# Patient Record
Sex: Female | Born: 1935 | Race: White | Hispanic: No | Smoking: Former smoker
Health system: Southern US, Community
[De-identification: ages and names within clinical notes are randomized; demographics above are authoritative.]

## PROBLEM LIST (undated history)

## (undated) DIAGNOSIS — F329 Major depressive disorder, single episode, unspecified: Secondary | ICD-10-CM

## (undated) DIAGNOSIS — F419 Anxiety disorder, unspecified: Secondary | ICD-10-CM

## (undated) DIAGNOSIS — J449 Chronic obstructive pulmonary disease, unspecified: Secondary | ICD-10-CM

## (undated) DIAGNOSIS — G4733 Obstructive sleep apnea (adult) (pediatric): Secondary | ICD-10-CM

## (undated) DIAGNOSIS — E079 Disorder of thyroid, unspecified: Secondary | ICD-10-CM

## (undated) DIAGNOSIS — I890 Lymphedema, not elsewhere classified: Secondary | ICD-10-CM

## (undated) DIAGNOSIS — C8590 Non-Hodgkin lymphoma, unspecified, unspecified site: Secondary | ICD-10-CM

## (undated) DIAGNOSIS — E785 Hyperlipidemia, unspecified: Secondary | ICD-10-CM

## (undated) DIAGNOSIS — I509 Heart failure, unspecified: Secondary | ICD-10-CM

## (undated) DIAGNOSIS — I10 Essential (primary) hypertension: Secondary | ICD-10-CM

## (undated) DIAGNOSIS — I499 Cardiac arrhythmia, unspecified: Secondary | ICD-10-CM

## (undated) DIAGNOSIS — D649 Anemia, unspecified: Secondary | ICD-10-CM

## (undated) DIAGNOSIS — F32A Depression, unspecified: Secondary | ICD-10-CM

## (undated) DIAGNOSIS — M1990 Unspecified osteoarthritis, unspecified site: Secondary | ICD-10-CM

## (undated) DIAGNOSIS — E119 Type 2 diabetes mellitus without complications: Secondary | ICD-10-CM

## (undated) DIAGNOSIS — N189 Chronic kidney disease, unspecified: Secondary | ICD-10-CM

## (undated) HISTORY — DX: Non-Hodgkin lymphoma, unspecified, unspecified site: C85.90

## (undated) HISTORY — PX: ABDOMINAL HYSTERECTOMY: SHX81

## (undated) HISTORY — DX: Type 2 diabetes mellitus without complications: E11.9

## (undated) HISTORY — PX: APPENDECTOMY: SHX54

## (undated) HISTORY — DX: Unspecified osteoarthritis, unspecified site: M19.90

## (undated) HISTORY — DX: Depression, unspecified: F32.A

## (undated) HISTORY — DX: Chronic kidney disease, unspecified: N18.9

## (undated) HISTORY — DX: Disorder of thyroid, unspecified: E07.9

## (undated) HISTORY — DX: Major depressive disorder, single episode, unspecified: F32.9

## (undated) HISTORY — DX: Anxiety disorder, unspecified: F41.9

## (undated) HISTORY — PX: KNEE SURGERY: SHX244

## (undated) HISTORY — DX: Cardiac arrhythmia, unspecified: I49.9

## (undated) HISTORY — PX: TONSILLECTOMY AND ADENOIDECTOMY: SHX28

## (undated) HISTORY — PX: CORONARY ANGIOPLASTY: SHX604

## (undated) HISTORY — DX: Essential (primary) hypertension: I10

## (undated) HISTORY — PX: OTHER SURGICAL HISTORY: SHX169

## (undated) HISTORY — DX: Obstructive sleep apnea (adult) (pediatric): G47.33

## (undated) HISTORY — DX: Anemia, unspecified: D64.9

## (undated) HISTORY — DX: Heart failure, unspecified: I50.9

## (undated) HISTORY — DX: Hyperlipidemia, unspecified: E78.5

## (undated) HISTORY — DX: Chronic obstructive pulmonary disease, unspecified: J44.9

## (undated) HISTORY — PX: CARDIAC CATHETERIZATION: SHX172

---

## 2009-06-24 DIAGNOSIS — G4733 Obstructive sleep apnea (adult) (pediatric): Secondary | ICD-10-CM

## 2009-06-24 HISTORY — DX: Obstructive sleep apnea (adult) (pediatric): G47.33

## 2013-11-14 ENCOUNTER — Emergency Department: Payer: Self-pay | Admitting: Emergency Medicine

## 2013-11-14 LAB — TROPONIN I: Troponin-I: <0.02 ng/mL

## 2013-11-14 LAB — CBC
HCT: 25.8 % — ABNORMAL LOW (ref 35.0–47.0)
HGB: 8.4 g/dL — AB (ref 12.0–16.0)
MCH: 31.9 pg (ref 26.0–34.0)
MCHC: 32.7 g/dL (ref 32.0–36.0)
MCV: 98 fL (ref 80–100)
Platelet: 168 10*3/uL (ref 150–440)
RBC: 2.64 10*6/uL — ABNORMAL LOW (ref 3.80–5.20)
RDW: 14.8 % — ABNORMAL HIGH (ref 11.5–14.5)
WBC: 11.3 10*3/uL — ABNORMAL HIGH (ref 3.6–11.0)

## 2013-11-14 LAB — BASIC METABOLIC PANEL
ANION GAP: 9 (ref 7–16)
BUN: 43 mg/dL — AB (ref 7–18)
Calcium, Total: 8.4 mg/dL — ABNORMAL LOW (ref 8.5–10.1)
Chloride: 104 mmol/L (ref 98–107)
Co2: 24 mmol/L (ref 21–32)
Creatinine: 2.23 mg/dL — ABNORMAL HIGH (ref 0.60–1.30)
EGFR (African American): 23 — ABNORMAL LOW
EGFR (Non-African Amer.): 20 — ABNORMAL LOW
Glucose: 216 mg/dL — ABNORMAL HIGH (ref 65–99)
OSMOLALITY: 291 (ref 275–301)
POTASSIUM: 4.3 mmol/L (ref 3.5–5.1)
Sodium: 137 mmol/L (ref 136–145)

## 2013-11-14 LAB — PRO B NATRIURETIC PEPTIDE: B-Type Natriuretic Peptide: 1352 pg/mL — ABNORMAL HIGH (ref 0–450)

## 2014-01-23 LAB — CBC WITH DIFFERENTIAL/PLATELET
BASOS ABS: 0.1 10*3/uL (ref 0.0–0.1)
BASOS PCT: 0.6 %
EOS ABS: 0.2 10*3/uL (ref 0.0–0.7)
Eosinophil %: 1 %
HCT: 34.3 % — AB (ref 35.0–47.0)
HGB: 10.7 g/dL — ABNORMAL LOW (ref 12.0–16.0)
Lymphocyte #: 2 10*3/uL (ref 1.0–3.6)
Lymphocyte %: 12.4 %
MCH: 30.8 pg (ref 26.0–34.0)
MCHC: 31.1 g/dL — ABNORMAL LOW (ref 32.0–36.0)
MCV: 99 fL (ref 80–100)
Monocyte #: 0.8 x10 3/mm (ref 0.2–0.9)
Monocyte %: 4.7 %
NEUTROS ABS: 12.9 10*3/uL — AB (ref 1.4–6.5)
NEUTROS PCT: 81.3 %
PLATELETS: 191 10*3/uL (ref 150–440)
RBC: 3.47 10*6/uL — ABNORMAL LOW (ref 3.80–5.20)
RDW: 15.2 % — ABNORMAL HIGH (ref 11.5–14.5)
WBC: 15.9 10*3/uL — AB (ref 3.6–11.0)

## 2014-01-24 ENCOUNTER — Inpatient Hospital Stay: Payer: Self-pay | Admitting: Internal Medicine

## 2014-01-24 LAB — TROPONIN I: Troponin-I: <0.02 ng/mL

## 2014-01-24 LAB — COMPREHENSIVE METABOLIC PANEL
ALK PHOS: 100 U/L
AST: 21 U/L (ref 15–37)
Albumin: 3 g/dL — ABNORMAL LOW (ref 3.4–5.0)
Anion Gap: 1 — ABNORMAL LOW (ref 7–16)
BILIRUBIN TOTAL: 0.4 mg/dL (ref 0.2–1.0)
BUN: 28 mg/dL — AB (ref 7–18)
CALCIUM: 8.7 mg/dL (ref 8.5–10.1)
CHLORIDE: 111 mmol/L — AB (ref 98–107)
Co2: 27 mmol/L (ref 21–32)
Creatinine: 1.55 mg/dL — ABNORMAL HIGH (ref 0.60–1.30)
GFR CALC AF AMER: 36 — AB
GFR CALC NON AF AMER: 31 — AB
GLUCOSE: 61 mg/dL — AB (ref 65–99)
OSMOLALITY: 281 (ref 275–301)
Potassium: 4.7 mmol/L (ref 3.5–5.1)
SGPT (ALT): 21 U/L
Sodium: 139 mmol/L (ref 136–145)
Total Protein: 7.2 g/dL (ref 6.4–8.2)

## 2014-01-24 LAB — HEMOGLOBIN A1C: Hemoglobin A1C: 8.4 % — ABNORMAL HIGH (ref 4.2–6.3)

## 2014-01-24 LAB — LIPID PANEL
Cholesterol: 117 mg/dL (ref 0–200)
HDL Cholesterol: 59 mg/dL (ref 40–60)
Ldl Cholesterol, Calc: 49 mg/dL (ref 0–100)
Triglycerides: 44 mg/dL (ref 0–200)
VLDL Cholesterol, Calc: 9 mg/dL (ref 5–40)

## 2014-01-24 LAB — URINALYSIS, COMPLETE
Bacteria: NONE SEEN
Bilirubin,UR: NEGATIVE
Glucose,UR: 50 mg/dL (ref 0–75)
Hyaline Cast: 2
KETONE: NEGATIVE
Leukocyte Esterase: NEGATIVE
Nitrite: NEGATIVE
Ph: 5 (ref 4.5–8.0)
Protein: 30
RBC,UR: 2 /HPF (ref 0–5)
Specific Gravity: 1.006 (ref 1.003–1.030)
Squamous Epithelial: 1

## 2014-01-24 LAB — TSH: Thyroid Stimulating Horm: 1.68 u[IU]/mL

## 2014-01-24 LAB — PRO B NATRIURETIC PEPTIDE: B-Type Natriuretic Peptide: 1054 pg/mL — ABNORMAL HIGH (ref 0–450)

## 2014-01-25 LAB — COMPREHENSIVE METABOLIC PANEL
Albumin: 2.6 g/dL — ABNORMAL LOW (ref 3.4–5.0)
Alkaline Phosphatase: 81 U/L
Anion Gap: 8 (ref 7–16)
BUN: 27 mg/dL — ABNORMAL HIGH (ref 7–18)
Bilirubin,Total: 1 mg/dL (ref 0.2–1.0)
Calcium, Total: 8.9 mg/dL (ref 8.5–10.1)
Chloride: 102 mmol/L (ref 98–107)
Co2: 28 mmol/L (ref 21–32)
Creatinine: 1.54 mg/dL — ABNORMAL HIGH (ref 0.60–1.30)
EGFR (African American): 36 — ABNORMAL LOW
EGFR (Non-African Amer.): 31 — ABNORMAL LOW
Glucose: 227 mg/dL — ABNORMAL HIGH (ref 65–99)
Osmolality: 288 (ref 275–301)
Potassium: 4.9 mmol/L (ref 3.5–5.1)
SGOT(AST): 11 U/L — ABNORMAL LOW (ref 15–37)
SGPT (ALT): 19 U/L
Sodium: 138 mmol/L (ref 136–145)
Total Protein: 5.9 g/dL — ABNORMAL LOW (ref 6.4–8.2)

## 2014-01-25 LAB — CBC WITH DIFFERENTIAL/PLATELET
Basophil #: 0.1 10*3/uL (ref 0.0–0.1)
Basophil %: 0.5 %
Eosinophil #: 0 10*3/uL (ref 0.0–0.7)
Eosinophil %: 0.2 %
HCT: 29.5 % — ABNORMAL LOW (ref 35.0–47.0)
HGB: 9.3 g/dL — ABNORMAL LOW (ref 12.0–16.0)
Lymphocyte #: 1.7 10*3/uL (ref 1.0–3.6)
Lymphocyte %: 13.4 %
MCH: 31.1 pg (ref 26.0–34.0)
MCHC: 31.5 g/dL — ABNORMAL LOW (ref 32.0–36.0)
MCV: 99 fL (ref 80–100)
Monocyte #: 0.7 x10 3/mm (ref 0.2–0.9)
Monocyte %: 5.5 %
Neutrophil #: 10 10*3/uL — ABNORMAL HIGH (ref 1.4–6.5)
Neutrophil %: 80.4 %
Platelet: 152 10*3/uL (ref 150–440)
RBC: 2.99 10*6/uL — ABNORMAL LOW (ref 3.80–5.20)
RDW: 15.3 % — ABNORMAL HIGH (ref 11.5–14.5)
WBC: 12.5 10*3/uL — ABNORMAL HIGH (ref 3.6–11.0)

## 2014-01-26 LAB — BASIC METABOLIC PANEL
Anion Gap: 6 — ABNORMAL LOW (ref 7–16)
BUN: 35 mg/dL — ABNORMAL HIGH (ref 7–18)
Calcium, Total: 8.3 mg/dL — ABNORMAL LOW (ref 8.5–10.1)
Chloride: 101 mmol/L (ref 98–107)
Co2: 29 mmol/L (ref 21–32)
Creatinine: 2.11 mg/dL — ABNORMAL HIGH (ref 0.60–1.30)
EGFR (African American): 25 — ABNORMAL LOW
EGFR (Non-African Amer.): 21 — ABNORMAL LOW
Glucose: 255 mg/dL — ABNORMAL HIGH (ref 65–99)
Osmolality: 289 (ref 275–301)
Potassium: 4.3 mmol/L (ref 3.5–5.1)
Sodium: 136 mmol/L (ref 136–145)

## 2014-01-26 LAB — CBC WITH DIFFERENTIAL/PLATELET
Basophil #: 0 10*3/uL (ref 0.0–0.1)
Basophil %: 0.3 %
Eosinophil #: 0.2 10*3/uL (ref 0.0–0.7)
Eosinophil %: 2.3 %
HCT: 26 % — ABNORMAL LOW (ref 35.0–47.0)
HGB: 8.8 g/dL — ABNORMAL LOW (ref 12.0–16.0)
Lymphocyte #: 1.2 10*3/uL (ref 1.0–3.6)
Lymphocyte %: 11.9 %
MCH: 32.3 pg (ref 26.0–34.0)
MCHC: 33.7 g/dL (ref 32.0–36.0)
MCV: 96 fL (ref 80–100)
Monocyte #: 0.5 x10 3/mm (ref 0.2–0.9)
Monocyte %: 5 %
Neutrophil #: 7.8 10*3/uL — ABNORMAL HIGH (ref 1.4–6.5)
Neutrophil %: 80.5 %
Platelet: 152 10*3/uL (ref 150–440)
RBC: 2.71 10*6/uL — ABNORMAL LOW (ref 3.80–5.20)
RDW: 14.8 % — ABNORMAL HIGH (ref 11.5–14.5)
WBC: 9.7 10*3/uL (ref 3.6–11.0)

## 2014-01-27 LAB — CBC WITH DIFFERENTIAL/PLATELET
Basophil #: 0 10*3/uL (ref 0.0–0.1)
Basophil %: 0.5 %
Eosinophil #: 0.4 10*3/uL (ref 0.0–0.7)
Eosinophil %: 6.5 %
HCT: 26.2 % — ABNORMAL LOW (ref 35.0–47.0)
HGB: 8.6 g/dL — ABNORMAL LOW (ref 12.0–16.0)
Lymphocyte #: 1.3 10*3/uL (ref 1.0–3.6)
Lymphocyte %: 20.3 %
MCH: 31.8 pg (ref 26.0–34.0)
MCHC: 32.8 g/dL (ref 32.0–36.0)
MCV: 97 fL (ref 80–100)
Monocyte #: 0.4 x10 3/mm (ref 0.2–0.9)
Monocyte %: 6 %
Neutrophil #: 4.4 10*3/uL (ref 1.4–6.5)
Neutrophil %: 66.7 %
Platelet: 167 10*3/uL (ref 150–440)
RBC: 2.7 10*6/uL — ABNORMAL LOW (ref 3.80–5.20)
RDW: 14.9 % — ABNORMAL HIGH (ref 11.5–14.5)
WBC: 6.6 10*3/uL (ref 3.6–11.0)

## 2014-01-27 LAB — BASIC METABOLIC PANEL
Anion Gap: 6 — ABNORMAL LOW (ref 7–16)
BUN: 56 mg/dL — ABNORMAL HIGH (ref 7–18)
Calcium, Total: 8.3 mg/dL — ABNORMAL LOW (ref 8.5–10.1)
Chloride: 100 mmol/L (ref 98–107)
Co2: 29 mmol/L (ref 21–32)
Glucose: 150 mg/dL — ABNORMAL HIGH (ref 65–99)
Osmolality: 288 (ref 275–301)
Potassium: 4.5 mmol/L (ref 3.5–5.1)
Sodium: 135 mmol/L — ABNORMAL LOW (ref 136–145)

## 2014-01-27 LAB — CREATININE, SERUM
Creatinine: 2.8 mg/dL — ABNORMAL HIGH (ref 0.60–1.30)
EGFR (African American): 18 — ABNORMAL LOW
EGFR (Non-African Amer.): 15 — ABNORMAL LOW

## 2014-01-28 LAB — BASIC METABOLIC PANEL
ANION GAP: 7 (ref 7–16)
BUN: 59 mg/dL — AB (ref 7–18)
CALCIUM: 8.4 mg/dL — AB (ref 8.5–10.1)
CREATININE: 2.42 mg/dL — AB (ref 0.60–1.30)
Chloride: 101 mmol/L (ref 98–107)
Co2: 29 mmol/L (ref 21–32)
EGFR (Non-African Amer.): 18 — ABNORMAL LOW
GFR CALC AF AMER: 21 — AB
Glucose: 149 mg/dL — ABNORMAL HIGH (ref 65–99)
Osmolality: 293 (ref 275–301)
Potassium: 4.3 mmol/L (ref 3.5–5.1)
Sodium: 137 mmol/L (ref 136–145)

## 2014-01-28 LAB — CULTURE, BLOOD (SINGLE)

## 2014-01-30 LAB — BASIC METABOLIC PANEL
Anion Gap: 7 (ref 7–16)
BUN: 41 mg/dL — AB (ref 7–18)
CO2: 28 mmol/L (ref 21–32)
CREATININE: 1.71 mg/dL — AB (ref 0.60–1.30)
Calcium, Total: 8.6 mg/dL (ref 8.5–10.1)
Chloride: 104 mmol/L (ref 98–107)
EGFR (Non-African Amer.): 28 — ABNORMAL LOW
GFR CALC AF AMER: 32 — AB
GLUCOSE: 365 mg/dL — AB (ref 65–99)
Osmolality: 302 (ref 275–301)
POTASSIUM: 4.8 mmol/L (ref 3.5–5.1)
SODIUM: 139 mmol/L (ref 136–145)

## 2014-01-31 LAB — PLATELET COUNT: Platelet: 241 10*3/uL (ref 150–440)

## 2014-02-02 LAB — EXPECTORATED SPUTUM ASSESSMENT W GRAM STAIN, RFLX TO RESP C

## 2014-02-11 ENCOUNTER — Ambulatory Visit: Payer: Self-pay | Admitting: Family

## 2014-03-21 ENCOUNTER — Ambulatory Visit: Payer: Self-pay | Admitting: Family

## 2014-04-26 ENCOUNTER — Emergency Department: Payer: Self-pay | Admitting: Emergency Medicine

## 2014-04-26 ENCOUNTER — Ambulatory Visit: Payer: Self-pay | Admitting: Emergency Medicine

## 2014-04-26 LAB — BASIC METABOLIC PANEL
ANION GAP: 7 (ref 7–16)
BUN: 39 mg/dL — AB (ref 7–18)
CHLORIDE: 106 mmol/L (ref 98–107)
CO2: 27 mmol/L (ref 21–32)
CREATININE: 1.76 mg/dL — AB (ref 0.60–1.30)
Calcium, Total: 9.3 mg/dL (ref 8.5–10.1)
EGFR (Non-African Amer.): 29 — ABNORMAL LOW
GFR CALC AF AMER: 36 — AB
Glucose: 240 mg/dL — ABNORMAL HIGH (ref 65–99)
Osmolality: 297 (ref 275–301)
Potassium: 4.3 mmol/L (ref 3.5–5.1)
Sodium: 140 mmol/L (ref 136–145)

## 2014-04-26 LAB — CBC
HCT: 40.7 % (ref 35.0–47.0)
HGB: 13 g/dL (ref 12.0–16.0)
MCH: 31.6 pg (ref 26.0–34.0)
MCHC: 32.1 g/dL (ref 32.0–36.0)
MCV: 99 fL (ref 80–100)
PLATELETS: 228 10*3/uL (ref 150–440)
RBC: 4.13 10*6/uL (ref 3.80–5.20)
RDW: 14.9 % — ABNORMAL HIGH (ref 11.5–14.5)
WBC: 8.9 10*3/uL (ref 3.6–11.0)

## 2014-04-26 LAB — TROPONIN I: Troponin-I: 0.02 ng/mL

## 2014-10-15 NOTE — Discharge Summary (Signed)
PATIENT NAME:  Jill York, Jill York MR#:  [id number] DATE OF BIRTH:  [birth year]  DATE OF ADMISSION:  01/24/2014 DATE OF DISCHARGE:    ANTICIPATED DISCHARGE DATE: 01/31/2014.  DISPOSITION: Skilled nursing facility.   DISCHARGE DIAGNOSES:  1.  Acute respiratory failure.  2.  Acute chronic obstructive pulmonary disease exacerbation. 3.  Bilateral pneumonia.  4.  Acute on chronic diastolic congestive heart failure.  5.  Acute renal failure over chronic kidney disease.  6.  Diabetes mellitus type 2.  7.  Anemia of chronic disease.  8.  Hypertension.  9.  Hyperlipidemia.  10.  Morbid obesity.   IMAGING STUDIES:  1.  Include a chest x-ray which showed pulmonary edema and bilateral pneumonia.  2.  Echocardiogram showed EF greater than 50% with no significant wall motion abnormalities.   ADMITTING HISTORY AND PHYSICAL: Please see detailed H and P dictated by Dr. Marcille Blanco. In brief, an 81 year old female patient was admitted to the Hospitalist Service after she presented with acute respiratory failure along with orthopnea, coughing.   HOSPITAL COURSE:  1.  Acute respiratory failure. This was secondary to a combination of bilateral pneumonia, COPD exacerbation and congestive heart failure. The patient initially diuresed well with improvement in breathing, but her creatinine slowly trended up for which Lasix was held. The patient has been on ceftriaxone and azithromycin and is doing well. Also, on a prednisone taper at this point. The patient is down to 2 liters oxygen from BiPAP, which she was on initially.  2.  Acute renal failure with CKD. This is secondary to her pneumonia and diuresis. Presently, the patient is on oral Lasix, creatinine is trending down and is close to normal, baseline is 1.6, today she is at 1.7.  3.  Anemia of chronic disease, stable.  4.  Deconditioning. The patient felt extremely weak, presently is improving, worked with physical therapy and SNF has been recommended.   At this  point, the patient needs further PT evaluation tomorrow. Depending on that, can decide if she needs to go SNF or home with Home Health.   DISCHARGE INSTRUCTIONS: The patient will be on a diabetic, low-salt diet. Activity as tolerated. Oxygen 2 liters continuous. Follow up with primary care physician in 1-2 weeks.   DISCHARGE MEDICATIONS:  1.  Aspirin 81 mg daily.  2.  Coreg 12.5 mg oral 2 times a day.  3.  Effexor 37.5 mg oral 2 times a day.  4.  Humalog 16 units subcutaneous 3 times a day before meals.  5.  Lantus 30 units subcutaneous once a day.  6.  Lipitor 80 mg oral once a day.  7.  Losartan 100 mg daily.  8.  Norvasc 10 mg 2 tablets oral 2 times a day.  9.  Plavix 75 mg daily. 10.  Synthroid 50 mcg daily.  11.  Lasix 40 mg daily.  12.  Levaquin 250 mg oral daily.  13.  Prednisone taper.  14.  DuoNeb 3 mL inhaled q. 4 p.r.n.  15.  Docusate/Senna 1 tablet b.i.d.   TIME SPENT: Today on this case was 45 minutes.   ____________________________ Leia Alf Joelle Flessner, MD srs:tm D: 01/30/2014 12:40:46 ET T: 01/30/2014 13:53:22 ET JOB#: [id number]  cc: Alveta Heimlich R. Zhana Jeangilles, MD, <Dictator> Neita Carp MD ELECTRONICALLY SIGNED 02/07/2014 14:11

## 2014-10-15 NOTE — Discharge Summary (Signed)
PATIENT NAME:  Jill York, Jill York MR#:  [id number] DATE OF BIRTH:  Oct 01, 1930  DATE OF ADMISSION:  01/24/2014 DATE OF DISCHARGE:  01/31/2014  ADDENDUM:   To earlier dictated discharge summary by Dr. Hillary Bow on 01/30/2014.  Patient's physical therapy evaluation was done on 08/10, and patient wanted to go home with home health anyways, so we had to arrange for those services at home, and we also arranged for oxygen supplementation at home, and discharge to home.   For further details, please see discharge summary done by Dr. Hillary Bow on 008/02/2014.    ____________________________ Ceasar Lund. Anselm Jungling, MD vgv:nt D: 02/01/2014 16:11:49 ET T: 02/01/2014 16:58:49 ET JOB#: [id number]  cc: Ceasar Lund. Anselm Jungling, MD, <Dictator> Vaughan Basta MD ELECTRONICALLY SIGNED 02/27/2014 9:13

## 2014-10-15 NOTE — H&P (Signed)
PATIENT NAME:  Jill York, Jill York MR#:  [id number] DATE OF BIRTH:  16-Feb-1931  DATE OF ADMISSION:  01/24/2014  REFERRING PHYSICIAN: Dr. Beather Arbour.  PRIMARY CARE PHYSICIAN: Currently in [place], but used to be Dr. Eloise Levels with [facility].  ADMITTING DIAGNOSES: Acute hypercarbic respiratory failure. Congestive heart failure exacerbation.  HISTORY OF PRESENT ILLNESS: This is an 81 year old Caucasian female who presented to the Emergency Department approximately two hours after the onset of wheezing and nausea. The patient never vomited, but had worsening respiratory distress. Upon arrival, her respiratory rate was 50 breaths per minute, and her oxygen saturation 79% on room air. She was immediately placed on oxygen, and following arterial blood gas results which showed respiratory acidosis, the patient was placed on CPAP of 5. Prior to this episode of acute respiratory distress, the patient had been taking all of her medicines as usual. She has seen her new primary care doctor just a little more than a week ago. The only change in her medication has been the addition of tramadol for knee pain. She is not sure exactly what precipitated today's episode; however, she was sitting outside on her porch for approximately one hour. She does not usually need supplemental oxygen at home. She is ambulatory at baseline and does not require BiPAP to sleep at night.   REVIEW OF SYSTEMS: CONSTITUTIONAL: The patient denies fever or weakness. She admits to some fatigue.  EYES: The patient denies decreased visual acuity or inflammation.  RESPIRATORY: The patient denies cough, but admits to wheezing and shortness of breath. CARDIOVASCULAR: The patient denies chest pain, orthopnea, or palpitations.  GASTROINTESTINAL: The patient admits to some nausea but denies vomiting, diarrhea, or constipation.  GENITOURINARY: The patient denies dysuria or increased frequency or hesitancy. ENDOCRINE: The patient denies nocturia or  polyuria.  HEMATOLOGIC AND LYMPHATIC: The patient denies bleeding. She admits to anemia and easy bruising.  INTEGUMENT: The patient denies rashes or lesions.  MUSCULOSKELETAL: The patient admits to arthralgia of her left knee, but denies any myalgias.  NEUROLOGIC: The patient denies numbness or weakness.  PSYCHIATRIC: The patient denies suicidal ideation or homicidal ideation.   PAST MEDICAL HISTORY: Significant for coronary artery disease, peripheral artery disease, diabetes mellitus type 2, hypertension and hyperlipidemia.   PAST SURGICAL HISTORY: The patient has had a hysterectomy as well as a left total knee replacement.   SOCIAL HISTORY: The patient is a former smoker. She does not do any drugs or drink alcohol.   FAMILY HISTORY: Significant for coronary artery disease and diabetes.   MEDICATIONS: The patient does not have her medication list at this time. She uses a Investment banker, corporate called [employer], which should have an accurate list. She also has Community education officer, which should have an accurate list of her medications.   ALLERGIES: Demerol, lisinopril, and latex tape.   PERTINENT LABORATORY RESULTS AND RADIOLOGIC FINDINGS: Arterial blood gas shows a pH of 7.27, pCO2 of 57, PO2 of 81 and a base excess of -1.7; that was taken on FiO2 of 60. BUN 28, creatinine 1.5, glucose 61. BNP is 1054. Troponin is negative. Lactic acid is normal. White count of 15.9, hematocrit 34.3, percentage of neutrophils 81%. Chest x-ray shows pulmonary edema.   PHYSICAL EXAM:  VITAL SIGNS: Temperature 98.2, heart rate 70, respiratory rate 30, blood pressure 165/86, pulse oximetry 94% on FiO2 of 60. Those vital signs were recorded while the patient was on a CPAP with 5 pressure.  GENERAL: The patient is now alert and oriented x3. She is  in no apparent distress.  HEENT: Normocephalic, atraumatic. EOMI. Pupils are equal, round and reactive to light and accommodation. Oropharynx is dry. CPAP mask in place.  NECK:  Trachea is midline. No adenopathy.  CHEST: Symmetric and atraumatic.  CARDIOVASCULAR: Tachycardic rhythm with normal S1, S2. No rubs, clicks, murmurs. LUNGS: Faint crackles in the bases bilaterally. Mildly increased effort with normal excursion. ABDOMEN: Positive bowel sounds, soft, nontender, nondistended. No hepatosplenomegaly. GENITOURINARY: Foley catheter in place by the ED.  MUSCULOSKELETAL: The patient moves all 4 extremities equally. She has 5/5 strength in upper and lower extremities bilaterally.  SKIN: There are no rashes or lesions. The patient does have a number of areas of ecchymosis secondary to accidental bumps around the house.  EXTREMITIES: No clubbing or cyanosis. The patient has trace pretibial edema of her left leg and 1+ pitting edema of her right leg, which she states is normal for her.  NEUROLOGIC: Cranial nerves II through XII are grossly intact.  PSYCHIATRIC: The patient's mood is normal. Affect is congruent.   ASSESSMENT AND PLAN:  1.  This is an 82 year old female with acute hypercarbic respiratory distress. Repeat arterial blood gas shows a significant improvement in pO2. We will wean oxygen at this time. She patient will remain on CPAP until her pH and pCO2 improve. At this time, she is has incomplete compensated respiratory failure. We will admit her to the Intensive Care Unit for respiratory care. She is hemodynamically stable.  2.  Systolic congestive heart failure. The patient has pulmonary edema. She has been given some Lasix in the Emergency Department. Due to her renal insufficiency, we will continue to gently diurese the patient. She currently has a Foley catheter in place for accurate intake and output, but we will remove this as soon as we prove that she has adequate urine output.  3.  Chronic kidney disease, stage III. The patient has a significantly better GFR and creatinine than she did on a previous Emergency Department visit three months ago for the same  problems. GFR is currently 31. We will avoid nephrotoxic agents as much as possible and hydrate her at half maintenance with D5 half-normal saline with 20 mEq of potassium.  4.  Hypertension. Continue the patient's home medications.  5.  Diabetes, type 2. Sliding scale while in the hospital.  6.  Hyperlipidemia. Will obtain lipid panel in the morning. Continue home medications.  7.  Peripheral artery disease status post iliac stenting on the right, stable.  8.  Deep vein thrombosis prophylaxis. Sequential compression devices. 9.  Gastrointestinal prophylaxis. Pantoprazole.  TIME SPENT ON ADMISSION ORDERS AND PATIENT CARE AND CRITICAL CARE: Roughly 1 hour.     ____________________________ Norva Riffle. Marcille Blanco, MD msd:cg D: 01/24/2014 02:39:32 ET T: 01/24/2014 03:18:51 ET JOB#: [id number]  cc: Norva Riffle. Marcille Blanco, MD, <Dictator> Norva Riffle Ji Fairburn MD ELECTRONICALLY SIGNED 01/24/2014 23:49

## 2014-10-20 ENCOUNTER — Emergency Department
Admit: 2014-10-20 | Disposition: A | Discharge status: Home / Self Care | Payer: Self-pay | Admitting: Emergency Medicine

## 2014-10-20 LAB — COMPREHENSIVE METABOLIC PANEL
ALBUMIN: 3.1 g/dL — AB
ANION GAP: 5 — AB (ref 7–16)
AST: 23 U/L
Alkaline Phosphatase: 84 U/L
BUN: 33 mg/dL — ABNORMAL HIGH
Bilirubin,Total: 0.8 mg/dL
CALCIUM: 8.8 mg/dL — AB
CO2: 28 mmol/L
Chloride: 106 mmol/L
Creatinine: 1.52 mg/dL — ABNORMAL HIGH
EGFR (African American): 37 — ABNORMAL LOW
GFR CALC NON AF AMER: 32 — AB
Glucose: 273 mg/dL — ABNORMAL HIGH
POTASSIUM: 4.6 mmol/L
SGPT (ALT): 24 U/L
Sodium: 139 mmol/L
Total Protein: 6.4 g/dL — ABNORMAL LOW

## 2014-10-20 LAB — CBC
HCT: 28.1 % — ABNORMAL LOW (ref 35.0–47.0)
HGB: 9 g/dL — ABNORMAL LOW (ref 12.0–16.0)
MCH: 31.3 pg (ref 26.0–34.0)
MCHC: 32 g/dL (ref 32.0–36.0)
MCV: 98 fL (ref 80–100)
Platelet: 159 10*3/uL (ref 150–440)
RBC: 2.88 10*6/uL — ABNORMAL LOW (ref 3.80–5.20)
RDW: 14.7 % — ABNORMAL HIGH (ref 11.5–14.5)
WBC: 10.2 10*3/uL (ref 3.6–11.0)

## 2014-10-20 LAB — PROTIME-INR
INR: 1.3
Prothrombin Time: 16.5 secs — ABNORMAL HIGH

## 2014-10-20 LAB — APTT: Activated PTT: 36.2 secs — ABNORMAL HIGH (ref 23.6–35.9)

## 2014-10-20 LAB — TROPONIN I: Troponin-I: <0.03 ng/mL

## 2014-10-20 LAB — CK TOTAL AND CKMB (NOT AT ARMC)
CK, Total: 56 U/L
CK-MB: 2.3 ng/mL

## 2014-10-20 LAB — PRO B NATRIURETIC PEPTIDE: B-Type Natriuretic Peptide: 163 pg/mL — ABNORMAL HIGH

## 2014-11-01 DIAGNOSIS — I5032 Chronic diastolic (congestive) heart failure: Secondary | ICD-10-CM

## 2015-01-01 ENCOUNTER — Inpatient Hospital Stay: Payer: Medicare Other

## 2015-01-01 ENCOUNTER — Inpatient Hospital Stay
Admit: 2015-01-01 | Discharge: 2015-01-01 | Disposition: A | Discharge status: Home / Self Care | Payer: Medicare Other | Attending: Internal Medicine | Admitting: Internal Medicine

## 2015-01-01 ENCOUNTER — Encounter: Payer: Self-pay | Admitting: Emergency Medicine

## 2015-01-01 ENCOUNTER — Emergency Department: Payer: Medicare Other

## 2015-01-01 ENCOUNTER — Inpatient Hospital Stay
Admission: EM | Admit: 2015-01-01 | Discharge: 2015-01-04 | DRG: 291 | Disposition: A | Discharge status: Skilled Nursing Facility | Payer: Medicare Other | Attending: Internal Medicine | Admitting: Internal Medicine

## 2015-01-01 DIAGNOSIS — I739 Peripheral vascular disease, unspecified: Secondary | ICD-10-CM | POA: Diagnosis present

## 2015-01-01 DIAGNOSIS — Y92009 Unspecified place in unspecified non-institutional (private) residence as the place of occurrence of the external cause: Secondary | ICD-10-CM

## 2015-01-01 DIAGNOSIS — F329 Major depressive disorder, single episode, unspecified: Secondary | ICD-10-CM | POA: Diagnosis present

## 2015-01-01 DIAGNOSIS — Z7951 Long term (current) use of inhaled steroids: Secondary | ICD-10-CM | POA: Diagnosis not present

## 2015-01-01 DIAGNOSIS — J441 Chronic obstructive pulmonary disease with (acute) exacerbation: Secondary | ICD-10-CM | POA: Diagnosis present

## 2015-01-01 DIAGNOSIS — Z885 Allergy status to narcotic agent status: Secondary | ICD-10-CM | POA: Diagnosis not present

## 2015-01-01 DIAGNOSIS — Z794 Long term (current) use of insulin: Secondary | ICD-10-CM | POA: Diagnosis not present

## 2015-01-01 DIAGNOSIS — I5041 Acute combined systolic (congestive) and diastolic (congestive) heart failure: Principal | ICD-10-CM | POA: Diagnosis present

## 2015-01-01 DIAGNOSIS — Z87891 Personal history of nicotine dependence: Secondary | ICD-10-CM

## 2015-01-01 DIAGNOSIS — E785 Hyperlipidemia, unspecified: Secondary | ICD-10-CM | POA: Diagnosis present

## 2015-01-01 DIAGNOSIS — R0602 Shortness of breath: Secondary | ICD-10-CM

## 2015-01-01 DIAGNOSIS — J9601 Acute respiratory failure with hypoxia: Secondary | ICD-10-CM | POA: Diagnosis present

## 2015-01-01 DIAGNOSIS — R2681 Unsteadiness on feet: Secondary | ICD-10-CM | POA: Diagnosis present

## 2015-01-01 DIAGNOSIS — E039 Hypothyroidism, unspecified: Secondary | ICD-10-CM | POA: Diagnosis present

## 2015-01-01 DIAGNOSIS — I482 Chronic atrial fibrillation: Secondary | ICD-10-CM | POA: Diagnosis present

## 2015-01-01 DIAGNOSIS — Z888 Allergy status to other drugs, medicaments and biological substances status: Secondary | ICD-10-CM | POA: Diagnosis not present

## 2015-01-01 DIAGNOSIS — Z7982 Long term (current) use of aspirin: Secondary | ICD-10-CM

## 2015-01-01 DIAGNOSIS — G4733 Obstructive sleep apnea (adult) (pediatric): Secondary | ICD-10-CM | POA: Diagnosis present

## 2015-01-01 DIAGNOSIS — M1990 Unspecified osteoarthritis, unspecified site: Secondary | ICD-10-CM | POA: Diagnosis present

## 2015-01-01 DIAGNOSIS — W19XXXA Unspecified fall, initial encounter: Secondary | ICD-10-CM

## 2015-01-01 DIAGNOSIS — I129 Hypertensive chronic kidney disease with stage 1 through stage 4 chronic kidney disease, or unspecified chronic kidney disease: Secondary | ICD-10-CM | POA: Diagnosis present

## 2015-01-01 DIAGNOSIS — Z66 Do not resuscitate: Secondary | ICD-10-CM | POA: Diagnosis present

## 2015-01-01 DIAGNOSIS — Z8572 Personal history of non-Hodgkin lymphomas: Secondary | ICD-10-CM

## 2015-01-01 DIAGNOSIS — E1122 Type 2 diabetes mellitus with diabetic chronic kidney disease: Secondary | ICD-10-CM | POA: Diagnosis present

## 2015-01-01 DIAGNOSIS — F418 Other specified anxiety disorders: Secondary | ICD-10-CM | POA: Diagnosis present

## 2015-01-01 DIAGNOSIS — N183 Chronic kidney disease, stage 3 (moderate): Secondary | ICD-10-CM | POA: Diagnosis present

## 2015-01-01 DIAGNOSIS — I2510 Atherosclerotic heart disease of native coronary artery without angina pectoris: Secondary | ICD-10-CM | POA: Diagnosis present

## 2015-01-01 DIAGNOSIS — Z79899 Other long term (current) drug therapy: Secondary | ICD-10-CM

## 2015-01-01 DIAGNOSIS — E1165 Type 2 diabetes mellitus with hyperglycemia: Secondary | ICD-10-CM | POA: Diagnosis present

## 2015-01-01 DIAGNOSIS — I509 Heart failure, unspecified: Secondary | ICD-10-CM

## 2015-01-01 LAB — COMPREHENSIVE METABOLIC PANEL
ALT: 27 U/L (ref 14–54)
AST: 22 U/L (ref 15–41)
Albumin: 3.6 g/dL (ref 3.5–5.0)
Alkaline Phosphatase: 81 U/L (ref 38–126)
Anion gap: 11 (ref 5–15)
BUN: 36 mg/dL — ABNORMAL HIGH (ref 6–20)
CO2: 26 mmol/L (ref 22–32)
Calcium: 8.9 mg/dL (ref 8.9–10.3)
Chloride: 104 mmol/L (ref 101–111)
Creatinine, Ser: 1.76 mg/dL — ABNORMAL HIGH (ref 0.44–1.00)
GFR calc Af Amer: 30 mL/min — ABNORMAL LOW (ref >60)
GFR calc non Af Amer: 26 mL/min — ABNORMAL LOW (ref >60)
Glucose, Bld: 159 mg/dL — ABNORMAL HIGH (ref 65–99)
POTASSIUM: 4.5 mmol/L (ref 3.5–5.1)
SODIUM: 141 mmol/L (ref 135–145)
TOTAL PROTEIN: 7.1 g/dL (ref 6.5–8.1)
Total Bilirubin: 0.8 mg/dL (ref 0.3–1.2)

## 2015-01-01 LAB — GLUCOSE, CAPILLARY
GLUCOSE-CAPILLARY: 239 mg/dL — AB (ref 65–99)
Glucose-Capillary: 157 mg/dL — ABNORMAL HIGH (ref 65–99)
Glucose-Capillary: 293 mg/dL — ABNORMAL HIGH (ref 65–99)

## 2015-01-01 LAB — CBC
HCT: 30.8 % — ABNORMAL LOW (ref 35.0–47.0)
Hemoglobin: 9.7 g/dL — ABNORMAL LOW (ref 12.0–16.0)
MCH: 30.2 pg (ref 26.0–34.0)
MCHC: 31.5 g/dL — ABNORMAL LOW (ref 32.0–36.0)
MCV: 95.9 fL (ref 80.0–100.0)
PLATELETS: 181 10*3/uL (ref 150–440)
RBC: 3.21 MIL/uL — AB (ref 3.80–5.20)
RDW: 16.1 % — ABNORMAL HIGH (ref 11.5–14.5)
WBC: 11.8 10*3/uL — ABNORMAL HIGH (ref 3.6–11.0)

## 2015-01-01 LAB — TROPONIN I

## 2015-01-01 LAB — BRAIN NATRIURETIC PEPTIDE: B Natriuretic Peptide: 215 pg/mL — ABNORMAL HIGH (ref 0.0–100.0)

## 2015-01-01 MED ORDER — MOMETASONE FURO-FORMOTEROL FUM 100-5 MCG/ACT IN AERO
2.0000 | INHALATION_SPRAY | Freq: Two times a day (BID) | RESPIRATORY_TRACT | Status: DC
  Administered 2015-01-01 – 2015-01-04 (×7): 2 via RESPIRATORY_TRACT
  Filled 2015-01-01 (×2): qty 8.8

## 2015-01-01 MED ORDER — INSULIN GLARGINE 100 UNIT/ML ~~LOC~~ SOLN
40.0000 [IU] | Freq: Every day | SUBCUTANEOUS | Status: DC
  Administered 2015-01-01 – 2015-01-03 (×3): 40 [IU] via SUBCUTANEOUS
  Filled 2015-01-01 (×4): qty 0.4

## 2015-01-01 MED ORDER — VENLAFAXINE HCL 37.5 MG PO TABS
75.0000 mg | ORAL_TABLET | Freq: Two times a day (BID) | ORAL | Status: DC
  Administered 2015-01-01 – 2015-01-04 (×7): 75 mg via ORAL
  Filled 2015-01-01 (×8): qty 2

## 2015-01-01 MED ORDER — APIXABAN 2.5 MG PO TABS
2.5000 mg | ORAL_TABLET | Freq: Two times a day (BID) | ORAL | Status: DC
  Administered 2015-01-01 – 2015-01-04 (×7): 2.5 mg via ORAL
  Filled 2015-01-01 (×7): qty 1

## 2015-01-01 MED ORDER — METOPROLOL TARTRATE 25 MG PO TABS
12.5000 mg | ORAL_TABLET | Freq: Two times a day (BID) | ORAL | Status: DC
  Administered 2015-01-01 – 2015-01-04 (×7): 12.5 mg via ORAL
  Filled 2015-01-01 (×7): qty 1

## 2015-01-01 MED ORDER — CALCITRIOL 0.25 MCG PO CAPS
0.2500 ug | ORAL_CAPSULE | ORAL | Status: DC
  Administered 2015-01-02 – 2015-01-04 (×2): 0.25 ug via ORAL
  Filled 2015-01-01 (×2): qty 1

## 2015-01-01 MED ORDER — PANTOPRAZOLE SODIUM 40 MG PO TBEC
40.0000 mg | DELAYED_RELEASE_TABLET | Freq: Every day | ORAL | Status: DC
  Administered 2015-01-02 – 2015-01-03 (×2): 40 mg via ORAL
  Filled 2015-01-01 (×2): qty 1

## 2015-01-01 MED ORDER — VITAMIN D 1000 UNITS PO TABS
1000.0000 [IU] | ORAL_TABLET | Freq: Every day | ORAL | Status: DC
  Administered 2015-01-02 – 2015-01-04 (×3): 1000 [IU] via ORAL
  Filled 2015-01-01 (×3): qty 1

## 2015-01-01 MED ORDER — IPRATROPIUM-ALBUTEROL 0.5-2.5 (3) MG/3ML IN SOLN
3.0000 mL | Freq: Once | RESPIRATORY_TRACT | Status: AC
  Administered 2015-01-01: 3 mL via RESPIRATORY_TRACT

## 2015-01-01 MED ORDER — LEVOTHYROXINE SODIUM 25 MCG PO TABS
137.0000 ug | ORAL_TABLET | Freq: Every day | ORAL | Status: DC
  Administered 2015-01-02 – 2015-01-04 (×3): 137 ug via ORAL
  Filled 2015-01-01 (×3): qty 1

## 2015-01-01 MED ORDER — FUROSEMIDE 10 MG/ML IJ SOLN
20.0000 mg | Freq: Once | INTRAMUSCULAR | Status: AC
  Administered 2015-01-01: 20 mg via INTRAVENOUS

## 2015-01-01 MED ORDER — FUROSEMIDE 10 MG/ML IJ SOLN
40.0000 mg | Freq: Once | INTRAMUSCULAR | Status: AC
  Administered 2015-01-01: 40 mg via INTRAVENOUS
  Filled 2015-01-01: qty 4

## 2015-01-01 MED ORDER — TRAMADOL HCL 50 MG PO TABS: 50.0000 mg | ORAL_TABLET | Freq: Two times a day (BID) | ORAL | Status: DC | PRN

## 2015-01-01 MED ORDER — FUROSEMIDE 10 MG/ML IJ SOLN: 40.0000 mg | Freq: Once | INTRAMUSCULAR | Status: DC

## 2015-01-01 MED ORDER — PREDNISONE 20 MG PO TABS
60.0000 mg | ORAL_TABLET | Freq: Once | ORAL | Status: AC
  Administered 2015-01-01: 60 mg via ORAL

## 2015-01-01 MED ORDER — MONTELUKAST SODIUM 10 MG PO TABS
10.0000 mg | ORAL_TABLET | Freq: Every day | ORAL | Status: DC
Start: 2015-01-01 — End: 2015-01-04
  Administered 2015-01-01 – 2015-01-03 (×3): 10 mg via ORAL
  Filled 2015-01-01 (×3): qty 1

## 2015-01-01 MED ORDER — GABAPENTIN 300 MG PO CAPS
300.0000 mg | ORAL_CAPSULE | Freq: Two times a day (BID) | ORAL | Status: DC
  Administered 2015-01-01 – 2015-01-04 (×7): 300 mg via ORAL
  Filled 2015-01-01 (×7): qty 1

## 2015-01-01 MED ORDER — CALCIUM CARBONATE-VITAMIN D 500-200 MG-UNIT PO TABS
1.0000 | ORAL_TABLET | Freq: Two times a day (BID) | ORAL | Status: DC
  Administered 2015-01-02 – 2015-01-04 (×4): 1 via ORAL
  Filled 2015-01-01 (×11): qty 1

## 2015-01-01 MED ORDER — ACETAMINOPHEN 325 MG PO TABS: 650.0000 mg | ORAL_TABLET | Freq: Four times a day (QID) | ORAL | Status: DC | PRN

## 2015-01-01 MED ORDER — ATORVASTATIN CALCIUM 20 MG PO TABS
80.0000 mg | ORAL_TABLET | Freq: Every day | ORAL | Status: DC
  Administered 2015-01-01: 80 mg via ORAL
  Filled 2015-01-01 (×2): qty 4

## 2015-01-01 MED ORDER — FUROSEMIDE 10 MG/ML IJ SOLN: 40.0000 mg | Freq: Two times a day (BID) | INTRAMUSCULAR | Status: DC

## 2015-01-01 MED ORDER — PREDNISONE 20 MG PO TABS
ORAL_TABLET | ORAL | Status: AC
  Administered 2015-01-01: 60 mg via ORAL
  Filled 2015-01-01: qty 3

## 2015-01-01 MED ORDER — INSULIN ASPART 100 UNIT/ML ~~LOC~~ SOLN
6.0000 [IU] | Freq: Three times a day (TID) | SUBCUTANEOUS | Status: DC
  Administered 2015-01-01 – 2015-01-04 (×9): 6 [IU] via SUBCUTANEOUS
  Filled 2015-01-01 (×10): qty 6

## 2015-01-01 MED ORDER — FLUTICASONE PROPIONATE 50 MCG/ACT NA SUSP
1.0000 | Freq: Every day | NASAL | Status: DC
  Administered 2015-01-01 – 2015-01-04 (×4): 1 via NASAL
  Filled 2015-01-01: qty 16

## 2015-01-01 MED ORDER — DOCUSATE SODIUM 100 MG PO CAPS: 100.0000 mg | ORAL_CAPSULE | Freq: Every day | ORAL | Status: DC | PRN

## 2015-01-01 MED ORDER — ASPIRIN EC 81 MG PO TBEC
81.0000 mg | DELAYED_RELEASE_TABLET | Freq: Every day | ORAL | Status: DC
  Administered 2015-01-01 – 2015-01-04 (×4): 81 mg via ORAL
  Filled 2015-01-01 (×4): qty 1

## 2015-01-01 MED ORDER — ALBUTEROL SULFATE (2.5 MG/3ML) 0.083% IN NEBU
2.5000 mg | INHALATION_SOLUTION | Freq: Four times a day (QID) | RESPIRATORY_TRACT | Status: DC
  Administered 2015-01-01 – 2015-01-04 (×10): 2.5 mg via RESPIRATORY_TRACT
  Filled 2015-01-01 (×12): qty 3

## 2015-01-01 MED ORDER — FUROSEMIDE 10 MG/ML IJ SOLN
INTRAMUSCULAR | Status: AC
  Administered 2015-01-01: 20 mg via INTRAVENOUS
  Filled 2015-01-01: qty 4

## 2015-01-01 MED ORDER — AMLODIPINE BESYLATE 5 MG PO TABS
5.0000 mg | ORAL_TABLET | Freq: Every day | ORAL | Status: DC
  Administered 2015-01-01 – 2015-01-04 (×4): 5 mg via ORAL
  Filled 2015-01-01 (×5): qty 1

## 2015-01-01 MED ORDER — PREDNISONE 20 MG PO TABS
20.0000 mg | ORAL_TABLET | Freq: Every day | ORAL | Status: DC
  Administered 2015-01-02: 20 mg via ORAL
  Filled 2015-01-01: qty 1

## 2015-01-01 MED ORDER — SODIUM CHLORIDE 0.9 % IJ SOLN
3.0000 mL | Freq: Two times a day (BID) | INTRAMUSCULAR | Status: DC
  Administered 2015-01-01 – 2015-01-03 (×5): 3 mL via INTRAVENOUS

## 2015-01-01 MED ORDER — IPRATROPIUM-ALBUTEROL 0.5-2.5 (3) MG/3ML IN SOLN
RESPIRATORY_TRACT | Status: AC
  Administered 2015-01-01: 3 mL via RESPIRATORY_TRACT
  Filled 2015-01-01: qty 3

## 2015-01-01 MED ORDER — LOSARTAN POTASSIUM 50 MG PO TABS
100.0000 mg | ORAL_TABLET | Freq: Every day | ORAL | Status: DC
  Administered 2015-01-01 – 2015-01-04 (×4): 100 mg via ORAL
  Filled 2015-01-01 (×4): qty 2

## 2015-01-01 MED ORDER — INSULIN ASPART 100 UNIT/ML ~~LOC~~ SOLN
0.0000 [IU] | Freq: Every day | SUBCUTANEOUS | Status: DC
  Administered 2015-01-01: 2 [IU] via SUBCUTANEOUS
  Administered 2015-01-02: 3 [IU] via SUBCUTANEOUS
  Filled 2015-01-01: qty 3
  Filled 2015-01-01: qty 2

## 2015-01-01 MED ORDER — BACLOFEN 10 MG PO TABS: 5.0000 mg | ORAL_TABLET | Freq: Every evening | ORAL | Status: DC | PRN

## 2015-01-01 MED ORDER — INSULIN ASPART 100 UNIT/ML ~~LOC~~ SOLN
0.0000 [IU] | Freq: Three times a day (TID) | SUBCUTANEOUS | Status: DC
  Administered 2015-01-01: 11 [IU] via SUBCUTANEOUS
  Administered 2015-01-01 – 2015-01-02 (×3): 4 [IU] via SUBCUTANEOUS
  Administered 2015-01-02: 11 [IU] via SUBCUTANEOUS
  Administered 2015-01-03: 3 [IU] via SUBCUTANEOUS
  Administered 2015-01-03 (×2): 4 [IU] via SUBCUTANEOUS
  Administered 2015-01-04: 7 [IU] via SUBCUTANEOUS
  Filled 2015-01-01: qty 11
  Filled 2015-01-01 (×2): qty 4
  Filled 2015-01-01: qty 7
  Filled 2015-01-01 (×4): qty 4
  Filled 2015-01-01: qty 11
  Filled 2015-01-01: qty 3

## 2015-01-01 MED ORDER — ACETAMINOPHEN 650 MG RE SUPP: 650.0000 mg | Freq: Four times a day (QID) | RECTAL | Status: DC | PRN

## 2015-01-01 NOTE — ED Notes (Signed)
Patient oxygen saturations decreased to 76% on room air. Patient placed on 3L [place] and saturations elevated to 93%. Corky Downs, MD made aware.

## 2015-01-01 NOTE — ED Provider Notes (Signed)
[facility] Emergency Department Provider Note  ____________________________________________  Time seen: Approximately 6:53 AM  I have reviewed the triage vital signs and the nursing notes.   HISTORY  Chief Complaint Shortness of Breath    HPI Jill York is a 81 y.o. female who presents to the ED via EMS for a chief complaint of shortness of breath. Patient states she was grocery shopping in the heat yesterday and feels this exacerbated her COPD. Patient has access to oxygen at home but does not routinely wear it.Patient denies fever, chills, cough, chest pain, abdominal pain, vomiting, diarrhea, headache, numbness, tingling. Patient denies recent travel or surgery. Of note, patient's daughter is currently away from home. Her daughters friend is staying with her and tells me the patient often has anxiety when her daughter is away.   Past Medical History  Diagnosis Date  . CHF (congestive heart failure)   . Thyroid disease   . Diabetes mellitus without complication   . Hypertension   . Hyperlipidemia   . Arrhythmia     PVC  . COPD (chronic obstructive pulmonary disease)   . Chronic kidney disease   . OSA (obstructive sleep apnea) 2011  . Anemia   . Anxiety and depression     [name]  . Lymphoma   . Osteoarthritis     Patient Active Problem List   Diagnosis Date Noted  . Chronic diastolic heart failure 78/93/8101    Past Surgical History  Procedure Laterality Date  . Tonsillectomy and adenoidectomy    . Appendectomy    . Knee surgery Left   . Abdominal hysterectomy    . Cardiac catheterization    . Coronary angioplasty      x4  . Cardiac stents      Current Outpatient Rx  Name  Route  Sig  Dispense  Refill  . amLODipine (NORVASC) 10 MG tablet   Oral   Take 10 mg by mouth daily.         Marland Kitchen aspirin 81 MG tablet   Oral   Take 81 mg by mouth daily.         Marland Kitchen atorvastatin (LIPITOR) 80 MG tablet    Oral   Take 80 mg by mouth daily.         . carvedilol (COREG) 12.5 MG tablet   Oral   Take 12.5 mg by mouth 2 (two) times daily with a meal.         . Cholecalciferol 1000 UNITS tablet   Oral   Take 1,000 Units by mouth daily.         . clopidogrel (PLAVIX) 75 MG tablet   Oral   Take 75 mg by mouth daily.         . Fluticasone-Salmeterol (ADVAIR) 250-50 MCG/DOSE AEPB   Inhalation   Inhale 1 puff into the lungs 2 (two) times daily.         . furosemide (LASIX) 40 MG tablet   Oral   Take 40 mg by mouth daily.         Marland Kitchen gabapentin (NEURONTIN) 100 MG capsule   Oral   Take 100 mg by mouth 3 (three) times daily.         . insulin glargine (LANTUS) 100 UNIT/ML injection   Subcutaneous   Inject 30 Units into the skin daily.         . insulin lispro (HUMALOG) 100 UNIT/ML injection   Subcutaneous  Inject 16 Units into the skin 3 (three) times daily before meals.         Marland Kitchen levothyroxine (SYNTHROID, LEVOTHROID) 50 MCG tablet   Oral   Take 50 mcg by mouth daily before breakfast.         . losartan (COZAAR) 100 MG tablet   Oral   Take 100 mg by mouth daily.         . montelukast (SINGULAIR) 10 MG tablet   Oral   Take 10 mg by mouth at bedtime.         . Polyethylene Glycol 3350 POWD   Oral   Take 17 g by mouth daily as needed (constipation).         Marland Kitchen senna-docusate (SENOKOT-S) 8.6-50 MG per tablet   Oral   Take 1 tablet by mouth 2 (two) times daily.         . traMADol (ULTRAM) 50 MG tablet   Oral   Take 50 mg by mouth every 6 (six) hours as needed.         . venlafaxine (EFFEXOR) 37.5 MG tablet   Oral   Take 37.5 mg by mouth 2 (two) times daily.           Allergies Demerol; Lisinopril; and Tape  History reviewed. No pertinent family history.  Social History History  Substance Use Topics  . Smoking status: Former Smoker -- 1.50 packs/day for 18 years    Quit date: 06/25/1975  . Smokeless tobacco: Never Used  . Alcohol  Use: No    Review of Systems Constitutional: No fever/chills Eyes: No visual changes. ENT: No sore throat. Cardiovascular: Denies chest pain. Respiratory: Positive for shortness of breath. Gastrointestinal: No abdominal pain.  No nausea, no vomiting.  No diarrhea.  No constipation. Genitourinary: Negative for dysuria. Musculoskeletal: Negative for back pain. Skin: Negative for rash. Neurological: Negative for headaches, focal weakness or numbness.  10-point ROS otherwise negative.  ____________________________________________   PHYSICAL EXAM:  VITAL SIGNS: ED Triage Vitals  Enc Vitals Group     BP 01/01/15 0623 132/91 mmHg     Pulse Rate 01/01/15 0623 114     Resp 01/01/15 0623 24     Temp 01/01/15 0623 98.1 F (36.7 C)     Temp Source 01/01/15 0623 Oral     SpO2 01/01/15 0623 94 %     Weight 01/01/15 0623 224 lb (101.606 kg)     Height 01/01/15 0623 5\' 4"  (1.626 m)     Head Cir --      Peak Flow --      Pain Score --      Pain Loc --      Pain Edu? --      Excl. in [place]? --     Constitutional: Alert and oriented. Well appearing and in no acute distress. Eyes: Conjunctivae are normal. PERRL. EOMI. Head: Atraumatic. Nose: No congestion/rhinnorhea. Mouth/Throat: Mucous membranes are moist.  Oropharynx non-erythematous. Neck: No stridor.   Cardiovascular: Irregularly irregular rhythm. Grossly normal heart sounds.  Good peripheral circulation. Respiratory: Normal respiratory effort.  No retractions. Lungs with occasional expiratory wheeze. Gastrointestinal: Soft and nontender. No distention. No abdominal bruits. No CVA tenderness. Musculoskeletal: No lower extremity tenderness nor edema.  No joint effusions. Neurologic:  Normal speech and language. No gross focal neurologic deficits are appreciated. Speech is normal.  Skin:  Skin is warm, dry and intact. No rash noted. Psychiatric: Mood and affect are normal. Speech and behavior are  normal.  ____________________________________________   LABS (all labs ordered are listed, but only abnormal results are displayed)  Labs Reviewed  CBC - Abnormal; Notable for the following:    WBC 11.8 (*)    RBC 3.21 (*)    Hemoglobin 9.7 (*)    HCT 30.8 (*)    MCHC 31.5 (*)    RDW 16.1 (*)    All other components within normal limits  TROPONIN I  COMPREHENSIVE METABOLIC PANEL  BRAIN NATRIURETIC PEPTIDE   ____________________________________________  EKG  ED ECG REPORT I, Jeancarlo Leffler J, the attending physician, personally viewed and interpreted this ECG.   Date: 01/01/2015  EKG Time: 0621  Rate: 113  Rhythm: atrial fibrillation, rate 113  Axis: LAD  Intervals:none  ST&T Change: Nonspecific  ____________________________________________  RADIOLOGY  Portable chest x-ray (viewed by me, interpreted per Dr. Pascal Lux): CHF pattern.   ____________________________________________   PROCEDURES  Procedure(s) performed: None  Critical Care performed: No  ____________________________________________   INITIAL IMPRESSION / ASSESSMENT AND PLAN / ED COURSE  Pertinent labs & imaging results that were available during my care of the patient were reviewed by me and considered in my medical decision making (see chart for details).  81 year old female who presents with shortness of breath with a history of COPD as well as congestive heart failure. Will check basic lab work including BNP, EKG, chest x-ray. Chest x-ray indicates CHF pattern; will administer IV Lasix. PO prednisone and DuoNeb ordered.   ----------------------------------------- 6:59 AM on 01/01/2015 -----------------------------------------  Care transferred to Dr. Corky Downs pending laboratory results and reassessment. ____________________________________________   FINAL CLINICAL IMPRESSION(S) / ED DIAGNOSES  Final diagnoses:  Shortness of breath  COPD with acute exacerbation      Paulette Blanch,  MD 01/01/15 0700

## 2015-01-01 NOTE — Evaluation (Signed)
Physical Therapy Evaluation Patient Details Name: Jill York MRN: [id number] DOB: March 29, 1931 Today's Date: 01/01/2015   History of Present Illness  Pt started having shortness of breath after going to the grocery store on 7/8  Clinical Impression  Pt is slow and labored with some mobility/transfers/ambualtion but ultimately does not show balance or significant safety concerns.  She does however de-saturate to 82% with very minimal ambulation on room air. Pt wants to go home, but being alone this does not seem like the safest option per today's performance.    Follow Up Recommendations SNF (pt does not want to go to rehab )    Equipment Recommendations       Recommendations for Other Services       Precautions / Restrictions Precautions Precautions: Fall Restrictions Weight Bearing Restrictions: No      Mobility  Bed Mobility Overal bed mobility: Modified Independent (heavy UE use of rails)                Transfers Overall transfer level: Modified independent Equipment used: Rolling walker (2 wheeled)                Ambulation/Gait Ambulation/Gait assistance: Min assist Ambulation Distance (Feet): 20 Feet Assistive device: Rolling walker (2 wheeled)       General Gait Details: attempted to ambulate on room air (pt at 98% sitting EOB), she becomes very fatigued and her sats quickly drop to 82%, does increase to 90s after 60-90 seconds on 4 liters.  Stairs            Wheelchair Mobility    Modified Rankin (Stroke Patients Only)       Balance                                             Pertinent Vitals/Pain Pain Assessment:  (chronic b/l knee pain)    Home Living Family/patient expects to be discharged to:: Private residence Living Arrangements: Alone (does have daily family assistance) Available Help at Discharge: Family Type of Home: House                Prior Function Level of Independence: Independent  with assistive device(s)         Comments: Pt reports that she is able to be out of the house and independent more than may be actually accurate     Hand Dominance        Extremity/Trunk Assessment   Upper Extremity Assessment:  (limited elevation, age appropriate strength deficts)           Lower Extremity Assessment: Generalized weakness         Communication   Communication: No difficulties  Cognition Arousal/Alertness: Awake/alert Behavior During Therapy: WFL for tasks assessed/performed Overall Cognitive Status: Within Functional Limits for tasks assessed                      General Comments      Exercises        Assessment/Plan    PT Assessment Patient needs continued PT services  PT Diagnosis Difficulty walking;Generalized weakness   PT Problem List Decreased strength;Decreased activity tolerance;Decreased mobility;Decreased balance;Decreased safety awareness  PT Treatment Interventions Gait training;Therapeutic activities;Therapeutic exercise;Balance training;Neuromuscular re-education   PT Goals (Current goals can be found in the Care Plan section) Acute Rehab PT Goals Patient Stated Goal: "I just  want to go home, I am independent" PT Goal Formulation: With patient Time For Goal Achievement: 01/15/15 Potential to Achieve Goals: Fair    Frequency Min 2X/week   Barriers to discharge        Co-evaluation               End of Session Equipment Utilized During Treatment: Gait belt Activity Tolerance: Patient limited by fatigue Patient left: with bed alarm set           Time: 1422-1449 PT Time Calculation (min) (ACUTE ONLY): 27 min   Charges:   PT Evaluation $Initial PT Evaluation Tier I: 1 Procedure     PT G Codes:       Wayne Both, PT, DPT [phone]  Kreg Shropshire 01/01/2015, 6:00 PM

## 2015-01-01 NOTE — ED Notes (Signed)
Admitting provider at bedside.

## 2015-01-01 NOTE — ED Notes (Signed)
Pt states began having shob on 12/30/2014. Pt states is worsening this am. Pt states "i can't even walk six feet." pt able to speak in full sentences.

## 2015-01-01 NOTE — Progress Notes (Signed)
Skin was checked by Rockie Neighbours RN

## 2015-01-01 NOTE — Progress Notes (Signed)
VSS. 3 L of oxygen. A fib. Takes meds ok. ECHO and CT chest completed. Friend at the bedside. Pt has not reported any pain. Up to [facility] and tolerated it well. Pt has no further concerns at this time.

## 2015-01-01 NOTE — Progress Notes (Signed)
*  PRELIMINARY RESULTS* Echocardiogram 2D Echocardiogram has been performed.  Jill York Stills 01/01/2015, 1:26 PM

## 2015-01-01 NOTE — ED Notes (Signed)
Pt ambulated to the bathroom by EDT Mickel Baas, NAD noted at this time.

## 2015-01-01 NOTE — ED Notes (Signed)
Pt ambulated to the bathroom with staff assistance. NAD noted at this time.

## 2015-01-01 NOTE — ED Notes (Signed)
Pt. States for the past two days having short of breath upon exhaustion.  Pt. States worse tonight.  Pt. States hx of COPD.  Pt. Also states hx of A-Fib.  Pt. States no regularly on O2, but has available if need.  Pt. PO2 was in high 80's upon arrival at patient's house, but went up to mid 90's when placed on 4L.  Pt. Denies pain at this time.

## 2015-01-01 NOTE — H&P (Signed)
Jill York at [employer] NAME: Jill York    MR#:  [id number]  DATE OF BIRTH:  Aug 09, 1930  DATE OF ADMISSION:  01/01/2015  PRIMARY CARE PHYSICIAN: Sharyne Peach, MD   REQUESTING/REFERRING PHYSICIAN: Lavonia Drafts, M.D.  CHIEF COMPLAINT:   Chief Complaint  Patient presents with  . Shortness of Breath    HISTORY OF PRESENT ILLNESS:  Jill York  is a 81 y.o. female with a known history of CHF, COPD, chronic kidney disease stage III, diabetes, hypothyroidism. She presents with shortness of breath going on since Friday 3-4 days. She says Friday it was very hot and she was wheezing and she can't get around as well. Normally she walks with a walker, she's been unsteady on her feet secondary to joint pains in the legs. She has a slight cough and some wheeze. No chest pain. In the ER she was found to be hypoxic with a pulse ox of dropping down in the 70% range while walking. Hospitalist services were contacted for further evaluation. The patient took her oral Lasix prior to coming to the emergency room and was given 20 g IV Lasix in the emergency room. Of note the patient did fall and hit her head on May 26.  PAST MEDICAL HISTORY:   Past Medical History  Diagnosis Date  . CHF (congestive heart failure)   . Thyroid disease   . Diabetes mellitus without complication   . Hypertension   . Hyperlipidemia   . Arrhythmia     PVC  . COPD (chronic obstructive pulmonary disease)   . Chronic kidney disease   . OSA (obstructive sleep apnea) 2011  . Anemia   . Anxiety and depression     [name]  . Lymphoma   . Osteoarthritis     PAST SURGICAL HISTORY:   Past Surgical History  Procedure Laterality Date  . Tonsillectomy and adenoidectomy    . Appendectomy    . Knee surgery Left   . Abdominal hysterectomy    . Cardiac catheterization    . Coronary angioplasty      x4  . Cardiac stents      SOCIAL  HISTORY:   History  Substance Use Topics  . Smoking status: Former Smoker -- 1.50 packs/day for 18 years    Quit date: 06/25/1975  . Smokeless tobacco: Never Used  . Alcohol Use: No    FAMILY HISTORY:   Family History  Problem Relation Age of Onset  . Aneurysm Mother   . Stroke Mother   . Liver cancer Father     DRUG ALLERGIES:   Allergies  Allergen Reactions  . Demerol [Meperidine] Diarrhea and Nausea And Vomiting  . Lisinopril Cough  . Tape Rash    REVIEW OF SYSTEMS:  CONSTITUTIONAL: Low-grade temperature, sweats, fatigue. EYES: No blurred or double vision. She wears glasses. Has cataracts. Seeing some bright lights. EARS, NOSE, AND THROAT: No tinnitus or ear pain. Some sore throat. Decreased hearing. RESPIRATORY: Positive for cough, shortness of breath, and wheezing. No hemoptysis.  CARDIOVASCULAR: No chest pain. History of palpitations  GASTROINTESTINAL: No nausea, vomiting, diarrhea or abdominal pain. No blood in bowel movements GENITOURINARY: No dysuria, hematuria.  ENDOCRINE: No polyuria, nocturia,  HEMATOLOGY: No anemia, easy bruising or bleeding SKIN: No rash or lesion. MUSCULOSKELETAL: Positive for arthritis all over the body  NEUROLOGIC: No tingling, numbness, weakness.  PSYCHIATRY: Positive for anxiety or depression.   MEDICATIONS AT HOME:  Prior to Admission medications   Medication Sig Start Date End Date Taking? Authorizing Provider  albuterol (PROVENTIL HFA;VENTOLIN HFA) 108 (90 BASE) MCG/ACT inhaler Inhale 1 puff into the lungs every 6 (six) hours as needed for wheezing or shortness of breath.   Yes Historical Provider, MD  amLODipine (NORVASC) 5 MG tablet Take 5 mg by mouth daily.   Yes Historical Provider, MD  apixaban (ELIQUIS) 2.5 MG TABS tablet Take 2.5 mg by mouth 2 (two) times daily.   Yes Historical Provider, MD  aspirin EC 81 MG tablet Take 81 mg by mouth daily.   Yes Historical Provider, MD  atorvastatin (LIPITOR) 80 MG tablet Take 80 mg  by mouth daily.   Yes Historical Provider, MD  baclofen (LIORESAL) 10 MG tablet Take 5 mg by mouth at bedtime as needed for muscle spasms.   Yes Historical Provider, MD  calcitRIOL (ROCALTROL) 0.25 MCG capsule Take 0.25 mcg by mouth every Monday, Wednesday, and Friday.   Yes Historical Provider, MD  Calcium Carbonate-Vitamin D 600-400 MG-UNIT per tablet Take 1 tablet by mouth 2 (two) times daily with a meal. 11/25/14 11/25/15 Yes Historical Provider, MD  Cholecalciferol 1000 UNITS tablet Take 1,000 Units by mouth daily.   Yes Historical Provider, MD  docusate sodium (COLACE) 100 MG capsule Take 100-200 mg by mouth daily as needed for mild constipation or moderate constipation.   Yes Historical Provider, MD  esomeprazole (NEXIUM) 20 MG capsule Take 20 mg by mouth daily as needed (for acid reflux).   Yes Historical Provider, MD  fluticasone (FLONASE) 50 MCG/ACT nasal spray Place 1 spray into both nostrils daily.   Yes Historical Provider, MD  Fluticasone-Salmeterol (ADVAIR) 250-50 MCG/DOSE AEPB Inhale 1 puff into the lungs 2 (two) times daily.   Yes Historical Provider, MD  furosemide (LASIX) 40 MG tablet Take 40 mg by mouth daily.   Yes Historical Provider, MD  gabapentin (NEURONTIN) 300 MG capsule Take 300 mg by mouth 2 (two) times daily.   Yes Historical Provider, MD  insulin glargine (LANTUS) 100 UNIT/ML injection Inject 40 Units into the skin at bedtime.    Yes Historical Provider, MD  insulin lispro (HUMALOG) 100 UNIT/ML injection Inject 18-20 Units into the skin 3 (three) times daily before meals.    Yes Historical Provider, MD  levothyroxine (SYNTHROID, LEVOTHROID) 137 MCG tablet Take 137 mcg by mouth daily before breakfast. Take with a glass of water at least 30 to 60 minutes before breakfast   Yes Historical Provider, MD  losartan (COZAAR) 100 MG tablet Take 100 mg by mouth daily.   Yes Historical Provider, MD  montelukast (SINGULAIR) 10 MG tablet Take 10 mg by mouth at bedtime.   Yes Historical  Provider, MD  traMADol (ULTRAM) 50 MG tablet Take 50 mg by mouth every 12 (twelve) hours as needed for moderate pain or severe pain.    Yes Historical Provider, MD  venlafaxine (EFFEXOR) 75 MG tablet Take 75 mg by mouth 2 (two) times daily.   Yes Historical Provider, MD      VITAL SIGNS:  Blood pressure 120/64, pulse 108, temperature 98.1 F (36.7 C), temperature source Oral, resp. rate 24, height 5\' 4"  (1.626 m), weight 101.606 kg (224 lb), SpO2 96 %.  PHYSICAL EXAMINATION:  GENERAL:  81 y.o.-year-old patient lying in the bed with no acute distress.  EYES: Pupils equal, round, reactive to light and accommodation. No scleral icterus. Extraocular muscles intact.  HEENT: Head atraumatic, normocephalic. Oropharynx and nasopharynx clear.  NECK:  Supple, no jugular venous distention. No thyroid enlargement, no tenderness.  LUNGS: Normal breath sounds bilaterally, no wheezing, rales,rhonchi or crepitation. No use of accessory muscles of respiration.  CARDIOVASCULAR: S1, S2 normal. No murmurs, rubs, or gallops.  ABDOMEN: Soft, nontender, nondistended. Bowel sounds present. No organomegaly or mass.  EXTREMITIES: No pedal edema, cyanosis, or clubbing.  NEUROLOGIC: Cranial nerves II through XII are intact. Muscle strength 5/5 in all extremities. Sensation intact. Gait not checked.  PSYCHIATRIC: The patient is alert and oriented x 3.  SKIN: No rash, lesion, or ulcer.   LABORATORY PANEL:   CBC  Recent Labs Lab 01/01/15 0635  WBC 11.8*  HGB 9.7*  HCT 30.8*  PLT 181    Chemistries   Recent Labs Lab 01/01/15 0635  NA 141  K 4.5  CL 104  CO2 26  GLUCOSE 159*  BUN 36*  CREATININE 1.76*  CALCIUM 8.9  AST 22  ALT 27  ALKPHOS 81  BILITOT 0.8   Cardiac Enzymes  Recent Labs Lab 01/01/15 0635  TROPONINI <0.03    RADIOLOGY:  Dg Chest Port 1 View  01/01/2015   CLINICAL DATA:  Difficulty breathing for 2 days  EXAM: PORTABLE CHEST - 1 VIEW  COMPARISON:  10/20/2014  FINDINGS:  Chronic cardiomegaly. There is interstitial coarsening with [employer]. Noted asymmetric opacity at the right base, but still felt to be from pulmonary edema. No suspected pneumonia. No pleural effusion.  Advanced right glenohumeral osteoarthritis.  IMPRESSION: CHF pattern.   Electronically Signed   By: Monte Fantasia M.D.   On: 01/01/2015 06:47    EKG:   Atrial fibrillation rapid ventricular response left axis deviation and Q waves septally.  IMPRESSION AND PLAN:   1. Acute respiratory failure with hypoxia. Patient's pulse ox dropped down to 70% with ambulation. I will continue to give oxygen supplementation. 2. Acute congestive heart failure. I will obtain an echocardiogram to determine whether or not this is systolic or diastolic congestive heart failure. I will give IV Lasix 40 mg IV twice a day, add low-dose metoprolol and patient is already on losartan. Patient follows with Dr. Nehemiah Massed as outpatient will get a cardiology consultation also. 3. COPD- this seems stable ER physician did give prednisone I will give a quick prednisone taper. 4. Chronic kidney disease stage III watch closely with diuresis. 5. Diabetes type 2- sugars will likely be high with steroids we'll put on sliding scale and continue Lantus. 6. History of marginal zone lymphoma left breast. 7. History of coronary artery disease. 8. Chronic atrial fibrillation- patient is anticoagulated with Eliquis. Add low-dose metoprolol for rate control. 9. Peripheral vascular disease. 10. Patient did hit her head with a fall in May. I will get a CAT scan of the head since the patient's complaining of unsteady gait and she is on Eliquis.  All the records are reviewed and case discussed with ED provider. Management plans discussed with the patient, family and they are in agreement.  CODE STATUS: DO NOT RESUSCITATE  TOTAL TIME TAKING CARE OF THIS PATIENT: 55 minutes.    Loletha Grayer M.D on 01/01/2015 at 10:41 AM  Between 7am  to 6pm - Pager - [phone]  After 6pm call admission pager [facility]  Office  [phone]  CC: Primary care physician; Sharyne Peach, MD

## 2015-01-01 NOTE — ED Provider Notes (Signed)
Notified by nurse of hypoxia, started on nasal cannula 3 L. Patient will likely require admission  Jill Drafts, MD 01/01/15 [phone]

## 2015-01-02 LAB — BASIC METABOLIC PANEL
Anion gap: 8 (ref 5–15)
BUN: 53 mg/dL — ABNORMAL HIGH (ref 6–20)
CALCIUM: 8.9 mg/dL (ref 8.9–10.3)
CHLORIDE: 105 mmol/L (ref 101–111)
CO2: 29 mmol/L (ref 22–32)
CREATININE: 1.88 mg/dL — AB (ref 0.44–1.00)
GFR calc non Af Amer: 24 mL/min — ABNORMAL LOW (ref >60)
GFR, EST AFRICAN AMERICAN: 28 mL/min — AB (ref >60)
Glucose, Bld: 192 mg/dL — ABNORMAL HIGH (ref 65–99)
POTASSIUM: 4.4 mmol/L (ref 3.5–5.1)
Sodium: 142 mmol/L (ref 135–145)

## 2015-01-02 LAB — GLUCOSE, CAPILLARY
GLUCOSE-CAPILLARY: 189 mg/dL — AB (ref 65–99)
GLUCOSE-CAPILLARY: 270 mg/dL — AB (ref 65–99)
Glucose-Capillary: 158 mg/dL — ABNORMAL HIGH (ref 65–99)
Glucose-Capillary: 264 mg/dL — ABNORMAL HIGH (ref 65–99)

## 2015-01-02 LAB — CBC
HCT: 29.7 % — ABNORMAL LOW (ref 35.0–47.0)
Hemoglobin: 9.2 g/dL — ABNORMAL LOW (ref 12.0–16.0)
MCH: 30.1 pg (ref 26.0–34.0)
MCHC: 31.1 g/dL — ABNORMAL LOW (ref 32.0–36.0)
MCV: 96.8 fL (ref 80.0–100.0)
Platelets: 166 10*3/uL (ref 150–440)
RBC: 3.06 MIL/uL — ABNORMAL LOW (ref 3.80–5.20)
RDW: 16.2 % — AB (ref 11.5–14.5)
WBC: 9.1 10*3/uL (ref 3.6–11.0)

## 2015-01-02 MED ORDER — FUROSEMIDE 40 MG PO TABS
60.0000 mg | ORAL_TABLET | Freq: Every day | ORAL | Status: DC
  Administered 2015-01-03 – 2015-01-04 (×2): 60 mg via ORAL
  Filled 2015-01-02 (×2): qty 1

## 2015-01-02 MED ORDER — FUROSEMIDE 40 MG PO TABS
40.0000 mg | ORAL_TABLET | Freq: Every day | ORAL | Status: DC
  Administered 2015-01-02: 40 mg via ORAL
  Filled 2015-01-02: qty 1

## 2015-01-02 MED ORDER — ATORVASTATIN CALCIUM 20 MG PO TABS
80.0000 mg | ORAL_TABLET | Freq: Every day | ORAL | Status: DC
  Administered 2015-01-02 – 2015-01-03 (×2): 80 mg via ORAL
  Filled 2015-01-02 (×2): qty 4

## 2015-01-02 NOTE — Progress Notes (Signed)
A&O x4. On 2L of oxygen. Used prn oxygen at home, instructed the patient that she will need to wear the oxygen at all times for now and will need to check her oxygen level at home. Patient agreed. Patient is hoping to discharge to [facility] tomorrow. Will continue to monitor.

## 2015-01-02 NOTE — Progress Notes (Signed)
Inpatient Diabetes Program Recommendations  AACE/ADA: New Consensus Statement on Inpatient Glycemic Control (2013)  Target Ranges:  Prepandial:   less than 140 mg/dL      Peak postprandial:   less than 180 mg/dL (1-2 hours)      Critically ill patients:  140 - 180 mg/dL   Results for Jill York, Jill York (MRN [id number]) as of 01/02/2015 09:33  Ref. Range 01/01/2015 11:44 01/01/2015 16:18 01/01/2015 21:16 01/02/2015 07:32  Glucose-Capillary Latest Ref Range: 65-99 mg/dL 157 (H) 293 (H) 239 (H) 158 (H)   Diabetes history: DM2 Outpatient Diabetes medications: Lantus 40 units QHS, Humalog 18-20 units TID with meals Current orders for Inpatient glycemic control: Lantus 40 units QHS, Novolog 0-20 units TID with meals, Novolog 0-5 units HS, Novolog 6 units TID with meals for meal coverage  Inpatient Diabetes Program Recommendations Insulin - Meal Coverage: If steroids are continued as ordered, please consider increasing meal coverage to Novolog 12 units TID with meals.  Thanks, Barnie Alderman, RN, MSN, CCRN, CDE Diabetes Coordinator Inpatient Diabetes Program [phone] (Team Pager from [place] to [place]) [phone] ([facility] office) [phone] [facility] office) [phone] [facility] office)

## 2015-01-02 NOTE — Consult Note (Signed)
[facility]  CARDIOLOGY CONSULT NOTE  Patient ID: Jill York MRN: [id number] DOB/AGE: [birth year] 81 y.o.  Admit date: 01/01/2015 Referring Physician Dr. Leslye Peer Primary Physician Dr.  Iona Beard Primary Cardiologist Dr. Nehemiah Massed Reason for Consultation CHF  HPI:  81 y.o. female with a known history of chronic afib anticoagulated with apixiban, history of coronary artery disease status post PCI with a functional study done in May 2016 revealing mildly reduced LV function with an ejection fraction of 47% and no ischemia, CHF, COPD, chronic kidney disease stage III, diabetes, hypothyroidism was admitted with complaints of 2 days of shortness of breath. Patient has a history of intermittent congestive heart failure. She admits to sodium indiscretion as well as being fairly active in the hot humid weather. She noted approximately a 2 pound weight gain as well as some peripheral edema. She presented to the emergency room where chest x-ray revealed mild pulmonary edema. Her atrial fibrillation is rate controlled and she is on apixaban for anticoagulation. She has improved with use of diarrhetic's and oxygen. She does use oxygen intermittently at home. He reports compliance with her medications. She is ruled out for myocardial infarction. She feels back to baseline this morning.  ROS Review of Systems - History obtained from chart review and the patient General ROS: positive for  - weight gain negative for - fever Respiratory ROS: positive for - shortness of breath negative for - cough, hemoptysis, sputum changes or wheezing Cardiovascular ROS: positive for - shortness of breath negative for - chest pain Gastrointestinal ROS: no abdominal pain, change in bowel habits, or black or bloody stools Musculoskeletal ROS: negative Neurological ROS: no TIA or stroke symptoms   Past Medical History  Diagnosis Date  . CHF (congestive heart failure)   . Thyroid disease    . Diabetes mellitus without complication   . Hypertension   . Hyperlipidemia   . Arrhythmia     PVC  . COPD (chronic obstructive pulmonary disease)   . Chronic kidney disease   . OSA (obstructive sleep apnea) 2011  . Anemia   . Anxiety and depression     [name]  . Lymphoma   . Osteoarthritis    atrial fibrillation Family History  Problem Relation Age of Onset  . Aneurysm Mother   . Stroke Mother   . Liver cancer Father     History   Social History  . Marital Status: Widowed    Spouse Name: N/A  . Number of Children: N/A  . Years of Education: N/A   Occupational History  . Not on file.   Social History Main Topics  . Smoking status: Former Smoker -- 1.50 packs/day for 18 years    Quit date: 06/25/1975  . Smokeless tobacco: Never Used  . Alcohol Use: No  . Drug Use: No  . Sexual Activity: Not on file   Other Topics Concern  . Not on file   Social History Narrative    Past Surgical History  Procedure Laterality Date  . Tonsillectomy and adenoidectomy    . Appendectomy    . Knee surgery Left   . Abdominal hysterectomy    . Cardiac catheterization    . Coronary angioplasty      x4  . Cardiac stents       Prescriptions prior to admission  Medication Sig Dispense Refill Last Dose  . albuterol (PROVENTIL HFA;VENTOLIN HFA) 108 (90 BASE) MCG/ACT inhaler Inhale 1 puff into  the lungs every 6 (six) hours as needed for wheezing or shortness of breath.   12/31/2014 at Unknown time  . amLODipine (NORVASC) 5 MG tablet Take 5 mg by mouth daily.   12/31/2014 at Unknown time  . apixaban (ELIQUIS) 2.5 MG TABS tablet Take 2.5 mg by mouth 2 (two) times daily.   12/31/2014 at Unknown time  . aspirin EC 81 MG tablet Take 81 mg by mouth daily.   12/31/2014 at Unknown time  . atorvastatin (LIPITOR) 80 MG tablet Take 80 mg by mouth daily.   12/31/2014 at Unknown time  . baclofen (LIORESAL) 10 MG tablet Take 5 mg by mouth at bedtime as needed for muscle spasms.    12/31/2014 at Unknown time  . calcitRIOL (ROCALTROL) 0.25 MCG capsule Take 0.25 mcg by mouth every Monday, Wednesday, and Friday.   12/30/2014  . Calcium Carbonate-Vitamin D 600-400 MG-UNIT per tablet Take 1 tablet by mouth 2 (two) times daily with a meal.   12/31/2014 at Unknown time  . Cholecalciferol 1000 UNITS tablet Take 1,000 Units by mouth daily.   12/31/2014 at Unknown time  . docusate sodium (COLACE) 100 MG capsule Take 100-200 mg by mouth daily as needed for mild constipation or moderate constipation.   12/31/2014 at Unknown time  . esomeprazole (NEXIUM) 20 MG capsule Take 20 mg by mouth daily as needed (for acid reflux).   12/31/2014 at Unknown time  . fluticasone (FLONASE) 50 MCG/ACT nasal spray Place 1 spray into both nostrils daily.   12/31/2014 at Unknown time  . Fluticasone-Salmeterol (ADVAIR) 250-50 MCG/DOSE AEPB Inhale 1 puff into the lungs 2 (two) times daily.   12/31/2014 at Unknown time  . furosemide (LASIX) 40 MG tablet Take 40 mg by mouth daily.   01/01/2015 at Unknown time  . gabapentin (NEURONTIN) 300 MG capsule Take 300 mg by mouth 2 (two) times daily.   12/31/2014 at Unknown time  . insulin glargine (LANTUS) 100 UNIT/ML injection Inject 40 Units into the skin at bedtime.    12/31/2014 at Unknown time  . insulin lispro (HUMALOG) 100 UNIT/ML injection Inject 18-20 Units into the skin 3 (three) times daily before meals.    01/01/2015 at Unknown time  . levothyroxine (SYNTHROID, LEVOTHROID) 137 MCG tablet Take 137 mcg by mouth daily before breakfast. Take with a glass of water at least 30 to 60 minutes before breakfast   12/31/2014 at Unknown time  . losartan (COZAAR) 100 MG tablet Take 100 mg by mouth daily.   12/31/2014 at Unknown time  . montelukast (SINGULAIR) 10 MG tablet Take 10 mg by mouth at bedtime.   12/31/2014 at Unknown time  . traMADol (ULTRAM) 50 MG tablet Take 50 mg by mouth every 12 (twelve) hours as needed for moderate pain or severe pain.    12/31/2014 at Unknown time  . venlafaxine  (EFFEXOR) 75 MG tablet Take 75 mg by mouth 2 (two) times daily.   12/31/2014 at Unknown time    Physical Exam: Blood pressure 119/48, pulse 79, temperature 97.6 F (36.4 C), temperature source Oral, resp. rate 18, height 5\' 4"  (1.626 m), weight 102.286 kg (225 lb 8 oz), SpO2 100 %.    General appearance: alert and cooperative Head: Normocephalic, without obvious abnormality, atraumatic Resp: clear to auscultation bilaterally Chest wall: no tenderness Cardio: irregularly irregular rhythm GI: soft, non-tender; bowel sounds normal; no masses,  no organomegaly Extremities: extremities normal, atraumatic, no cyanosis or edema Pulses: 2+ and symmetric Neurologic: Alert and oriented X 3, normal  strength and tone. Normal symmetric reflexes. Normal coordination and gait Labs:   Lab Results  Component Value Date   WBC 9.1 01/02/2015   HGB 9.2* 01/02/2015   HCT 29.7* 01/02/2015   MCV 96.8 01/02/2015   PLT 166 01/02/2015    Recent Labs Lab 01/01/15 0635 01/02/15 0433  NA 141 142  K 4.5 4.4  CL 104 105  CO2 26 29  BUN 36* 53*  CREATININE 1.76* 1.88*  CALCIUM 8.9 8.9  PROT 7.1  --   BILITOT 0.8  --   ALKPHOS 81  --   ALT 27  --   AST 22  --   GLUCOSE 159* 192*   Lab Results  Component Value Date   TROPONINI <0.03 01/01/2015      Radiology: Cardiomegaly with Kerley B-lines. No pleural effusions. EKG: Atrial fibrillation with left axis deviation. No ischemia.  ASSESSMENT AND PLAN:    #1. Congestive heart failure-patient had evidence of mild volume overload on chest x-ray and clinically. She has improved with diuresis. She has mildly reduced LV function by recent functional study at the office with an ejection fraction of 47%. She appears to have had mild acute on chronic mixed systolic and diastolic heart failure. She appears to be back to her baseline with current diuresis. Will convert her furosemide to oral and continue with amlodipine, losartan and beta blockers for blood  pressure control. Patient was not on metoprolol home. We'll follow on telemetry however appears to be tolerating this well  #2. Atrial fibrillation-rate is well-controlled with current regimen. She is anticoagulated with apixaban at 2.5 mg twice daily due to renal insufficiency. We'll continue with this regimen and follow  #3. COPD-on prednisone currently. We'll continue with this and follow. We'll also continue with Dr. dilators  #4. Coronary artery disease-status post PCI in the past. His ruled out for myocardial infarction and does not appear to be ischemic clinically or by electrocardiogram  After conversion to oral furosemide, would ambulate today and follow for improved symptoms. If she is back to her baseline and stable would consider discharge to home with follow-up with Dr. Nehemiah Massed and Dr. Iona Beard as an outpatient. Signed: Teodoro Spray MD, [facility] 01/02/2015, 6:54 AM

## 2015-01-02 NOTE — Care Management (Cosign Needed)
Patient presents from home.  She lives alone and her daughter lives a few houses down.  Patient has had knee replacement on the left and now has bone on bone on the right.  Patient has undergone 3 procedures on her right knee- cortisone injections- nerve block  And about 3 weeks ago had a laser procedure to "kill the nerve."  She has a return appointment 2 months after the procedure. Says she is able to   Patient has chronic home 02 through [facility].  Has a concentrator and portable tanks "to travel with."  Contacted [facility] and informed patient has a cpap through agency but no home 02.  Found that 02 is through [facility] and order is for continuous.  Physical therapy has recommended skilled nursing and facility preference would be [facility], [facility] or [facility].  Patient initially denied this recommendation but is now agreeable.  She and her daughter have also made contact with [facility] for some in home continuous care support.  Also discussed that after discharge from skilled nursing, home health nursing, physical therapy , etc could be ordered.  Updated  CSW

## 2015-01-02 NOTE — Progress Notes (Signed)
Patient ID: TAMEA BAI, female   DOB: [birth year], 81 y.o.   MRN: [id number] [facility] PROGRESS NOTE  PCP: Sharyne Peach, MD  HPI/Subjective: Patient feels that her breathing is better. She still has a cough and trying to get up some phlegm. Family concerned about her living alone. Patient does not want to go to rehabilitation. Numerous questions about home health and 24 7 coverage.   Objective: Filed Vitals:   01/02/15 0813  BP: 126/50  Pulse: 76  Temp:   Resp: 18    Intake/Output Summary (Last 24 hours) at 01/02/15 1024 Last data filed at 01/02/15 0933  Gross per 24 hour  Intake    491 ml  Output    700 ml  Net   -209 ml   Filed Weights   01/01/15 0623 01/01/15 1137 01/02/15 0518  Weight: 101.606 kg (224 lb) 103.012 kg (227 lb 1.6 oz) 102.286 kg (225 lb 8 oz)    ROS: Review of Systems  Constitutional: Negative for fever and chills.  Eyes: Negative for blurred vision.  Respiratory: Positive for cough and shortness of breath. Negative for sputum production.   Cardiovascular: Negative for chest pain.  Gastrointestinal: Negative for nausea, vomiting, abdominal pain, diarrhea and constipation.  Genitourinary: Negative for dysuria.  Musculoskeletal: Negative for joint pain.  Neurological: Negative for dizziness and headaches.   Exam: Physical Exam  Constitutional: She is oriented to person, place, and time.  HENT:  Nose: No mucosal edema.  Mouth/Throat: No oropharyngeal exudate or posterior oropharyngeal edema.  Eyes: Conjunctivae, EOM and lids are normal. Pupils are equal, round, and reactive to light.  Neck: No JVD present. Carotid bruit is not present. No edema present. No thyroid mass and no thyromegaly present.  Cardiovascular: S1 normal and S2 normal.  An irregularly irregular rhythm present. Exam reveals no gallop.   No murmur heard. Pulses:      Dorsalis pedis pulses are 2+ on the right side, and 2+ on the left side.  Respiratory: No  respiratory distress. She has no wheezes. She has no rhonchi. She has rales in the right lower field and the left lower field.  GI: Soft. Bowel sounds are normal. There is no tenderness.  Musculoskeletal:       Right ankle: She exhibits swelling.       Left ankle: She exhibits swelling.  Lymphadenopathy:    She has no cervical adenopathy.  Neurological: She is alert and oriented to person, place, and time. No cranial nerve deficit.  Skin: Skin is warm. No rash noted. Nails show no clubbing.  Psychiatric: She has a normal mood and affect.    Data Reviewed: Basic Metabolic Panel:  Recent Labs Lab 01/01/15 0635 01/02/15 0433  NA 141 142  K 4.5 4.4  CL 104 105  CO2 26 29  GLUCOSE 159* 192*  BUN 36* 53*  CREATININE 1.76* 1.88*  CALCIUM 8.9 8.9   Liver Function Tests:  Recent Labs Lab 01/01/15 0635  AST 22  ALT 27  ALKPHOS 81  BILITOT 0.8  PROT 7.1  ALBUMIN 3.6   CBC:  Recent Labs Lab 01/01/15 0635 01/02/15 0433  WBC 11.8* 9.1  HGB 9.7* 9.2*  HCT 30.8* 29.7*  MCV 95.9 96.8  PLT 181 166    CBG:  Recent Labs Lab 01/01/15 1144 01/01/15 1618 01/01/15 2116 01/02/15 0732  GLUCAP 157* 293* 239* 158*     Studies: Ct Head Wo Contrast  01/01/2015   CLINICAL DATA:  New onset  of unsteady gait and hypoxia.  EXAM: CT HEAD WITHOUT CONTRAST  TECHNIQUE: Contiguous axial images were obtained from the base of the skull through the vertex without intravenous contrast.  COMPARISON:  None.  FINDINGS: The ventricles are normal in size and configuration for age. No extra-axial fluid collections are identified. The gray-white differentiation is normal. No CT findings for acute intracranial process such as hemorrhage or infarction. No mass lesions. The brainstem and cerebellum are grossly normal. Moderate atherosclerotic calcifications involving the intracranial vessels.  The bony structures are intact. The paranasal sinuses and mastoid air cells are clear except for chronic right  half sphenoid sinusitis. The globes are intact.  IMPRESSION: Age related cerebral atrophy, ventriculomegaly and periventricular white matter disease but no acute intracranial findings or mass lesions.   Electronically Signed   By: Marijo Sanes M.D.   On: 01/01/2015 13:00   Dg Chest Port 1 View  01/01/2015   CLINICAL DATA:  Difficulty breathing for 2 days  EXAM: PORTABLE CHEST - 1 VIEW  COMPARISON:  10/20/2014  FINDINGS: Chronic cardiomegaly. There is interstitial coarsening with [employer]. Noted asymmetric opacity at the right base, but still felt to be from pulmonary edema. No suspected pneumonia. No pleural effusion.  Advanced right glenohumeral osteoarthritis.  IMPRESSION: CHF pattern.   Electronically Signed   By: Monte Fantasia M.D.   On: 01/01/2015 06:47    Scheduled Meds: . albuterol  2.5 mg Nebulization Q6H  . amLODipine  5 mg Oral Daily  . apixaban  2.5 mg Oral BID  . aspirin EC  81 mg Oral Daily  . atorvastatin  80 mg Oral Daily  . calcitRIOL  0.25 mcg Oral Q M,W,F  . calcium-vitamin D  1 tablet Oral BID WC  . cholecalciferol  1,000 Units Oral Daily  . fluticasone  1 spray Each Nare Daily  . [START ON 01/03/2015] furosemide  60 mg Oral Daily  . gabapentin  300 mg Oral BID  . insulin aspart  0-20 Units Subcutaneous TID WC  . insulin aspart  0-5 Units Subcutaneous QHS  . insulin aspart  6 Units Subcutaneous TID WC  . insulin glargine  40 Units Subcutaneous QHS  . levothyroxine  137 mcg Oral QAC breakfast  . losartan  100 mg Oral Daily  . metoprolol tartrate  12.5 mg Oral BID  . mometasone-formoterol  2 puff Inhalation BID  . montelukast  10 mg Oral QHS  . pantoprazole  40 mg Oral Daily  . sodium chloride  3 mL Intravenous Q12H  . venlafaxine  75 mg Oral BID    Assessment/Plan:  1. Acute respiratory failure. Since patient is breathing better today and will check a pulse ox on room air to see if she qualifies for home oxygen. 2. Acute combined systolic and diastolic  congestive heart failure. Low-dose metoprolol added. Cardiology switch Lasix over to oral. I will increase the dose to 60 mg daily secondary to chronic kidney disease stage III. 3. Weakness- physical therapy recommended rehabilitation. Patient does not want to go. Daughter concerned about safety at home because she lives at home by herself. 4. COPD- I don't think this is exacerbation will DC steroids. 5. Atrial fibrillation- now rate controlled on metoprolol. Anticoagulated with Eliquis. Safety at home is a concern since the patient is on Eliquis and she refuses rehabilitation. 6. Diabetes type 2- continue Lantus and sliding scale. 7. Chronic kidney disease stage III- watch creatinine with diuresis. 8. Essential hypertension continue usual medications.  Code Status:  Code Status Orders        Start     Ordered   01/01/15 1029  Do not attempt resuscitation (DNR)   Continuous    Question Answer Comment  In the event of cardiac or respiratory ARREST Do not call a "code blue"   In the event of cardiac or respiratory ARREST Do not perform Intubation, CPR, defibrillation or ACLS   In the event of cardiac or respiratory ARREST Use medication by any route, position, wound care, and other measures to relive pain and suffering. May use oxygen, suction and manual treatment of airway obstruction as needed for comfort.   Comments nurse may pronounce      01/01/15 1029    Advance Directive Documentation        Most Recent Value   Type of Advance Directive  Living will   Pre-existing out of facility DNR order (yellow form or pink MOST form)     "MOST" Form in Place?       Family Communication: Daughter at bedside. Disposition Plan: Patient does not want to go to rehabilitation. Likely home with home health. Family interested in 24/ 7 care. Discharge likely tomorrow. I will get care manager consultation.  Time spent: 30 minutes.  Loletha Grayer  [facility]  [facility]ospitalists

## 2015-01-02 NOTE — Progress Notes (Deleted)
Physical Therapy Treatment Patient Details Name: Jill York MRN: [id number] DOB: 12-19-1930 Today's Date: 01/02/2015    History of Present Illness Pt started having shortness of breath after going to the grocery store on 7/8    PT Comments    Pt is progressing towards goals this date. Her ambulation has increased, however she still requires some assistance with bed mobility and demonstrates decreased activity tolerance secondary to cardiopulmonary status. Pt states recent falls with no known cause apart from her R knee pain. Pt will continue to benefit from skilled PT in order to recover endurance back to baseline and address the reason for her falls in order to return to home environment safely.   Follow Up Recommendations   (Pt states she is willing to go to rehab)     Equipment Recommendations  None recommended by PT    Recommendations for Other Services       Precautions / Restrictions Precautions Precautions: Fall Restrictions Weight Bearing Restrictions: No    Mobility  Bed Mobility Overal bed mobility: Needs Assistance Bed Mobility: Supine to Sit     Supine to sit: Min assist     General bed mobility comments: min assist for lifting support   Transfers Overall transfer level: Modified independent Equipment used: Rolling walker (2 wheeled)             General transfer comment: Pt able to transfer with increased time to complete task.  Ambulation/Gait Ambulation/Gait assistance: Min guard Ambulation Distance (Feet): 130 Feet Assistive device: Rolling walker (2 wheeled) Gait Pattern/deviations: Step-through pattern Gait velocity: decreased   General Gait Details: Pt pre-gait O2 on room air at rest was 85%. After sitting up on EOB it dropped to 84%. She was placed on 2L [place] prior to ambulation and went up to 92%. Following 70 ft of ambulation she had dropped to 89% and at the end of ambulation she was at 94% with vc for proper breathing techniques. Pt  noted fatigue during the end of ambulation as well as pain in her R knee. Pt demonstrates reciprocal step-through gait    Stairs            Wheelchair Mobility    Modified Rankin (Stroke Patients Only)       Balance Overall balance assessment: History of Falls                                  Cognition Arousal/Alertness: Awake/alert Behavior During Therapy: WFL for tasks assessed/performed Overall Cognitive Status: Within Functional Limits for tasks assessed                      Exercises      General Comments        Pertinent Vitals/Pain Pain Assessment: No/denies pain    Home Living                      Prior Function            PT Goals (current goals can now be found in the care plan section) Acute Rehab PT Goals Patient Stated Goal: wishes to go home  PT Goal Formulation: With patient Time For Goal Achievement: 01/15/15 Potential to Achieve Goals: Good Progress towards PT goals: Progressing toward goals    Frequency  Min 2X/week    PT Plan Current plan remains appropriate    Co-evaluation  End of Session Equipment Utilized During Treatment: Gait belt;Oxygen Activity Tolerance: Patient limited by fatigue Patient left: with bed alarm set;with family/visitor present;with call bell/phone within reach     Time: 6349-4944 PT Time Calculation (min) (ACUTE ONLY): 31 min  Charges:                       G CodesJanyth Contes 11-Jan-2015, 11:59 AM  Janyth Contes, SPT. [phone]

## 2015-01-02 NOTE — Progress Notes (Signed)
Physical Therapy Treatment Patient Details Name: Jill York MRN: [id number] DOB: 1930/08/19 Today's Date: 01/02/2015    History of Present Illness Pt started having shortness of breath after going to the grocery store on 7/8    PT Comments    Pt is progressing towards goals this date. Her ambulation has increased, however she still requires some assistance with bed mobility and demonstrates decreased activity tolerance secondary to cardiopulmonary status. Pt states recent falls with no known cause apart from her R knee pain. Pt will continue to benefit from skilled PT in order to recover endurance back to baseline and address the reason for her falls in order to return to home environment safely.   Follow Up Recommendations  SNF (Pt states she is willing to go to rehab)     Equipment Recommendations  None recommended by PT    Recommendations for Other Services       Precautions / Restrictions Precautions Precautions: Fall Restrictions Weight Bearing Restrictions: No    Mobility  Bed Mobility Overal bed mobility: Needs Assistance Bed Mobility: Supine to Sit     Supine to sit: Min assist     General bed mobility comments: min assist for lifting support   Transfers Overall transfer level: Modified independent Equipment used: Rolling walker (2 wheeled)             General transfer comment: Pt able to transfer with increased time to complete task.  Ambulation/Gait Ambulation/Gait assistance: Min guard Ambulation Distance (Feet): 130 Feet Assistive device: Rolling walker (2 wheeled) Gait Pattern/deviations: Step-through pattern Gait velocity: decreased   General Gait Details: Pt pre-gait O2 on room air at rest was 85%. After sitting up on EOB it dropped to 84%. She was placed on 2L [place] prior to ambulation and went up to 92%. Following 70 ft of ambulation she had dropped to 89% and at the end of ambulation she was at 94% with vc for proper breathing techniques.  Pt noted fatigue during the end of ambulation as well as pain in her R knee. Pt demonstrates reciprocal step-through gait    Stairs            Wheelchair Mobility    Modified Rankin (Stroke Patients Only)       Balance Overall balance assessment: History of Falls                                  Cognition Arousal/Alertness: Awake/alert Behavior During Therapy: WFL for tasks assessed/performed Overall Cognitive Status: Within Functional Limits for tasks assessed                      Exercises      General Comments        Pertinent Vitals/Pain Pain Assessment: No/denies pain    Home Living                      Prior Function            PT Goals (current goals can now be found in the care plan section) Acute Rehab PT Goals Patient Stated Goal: wishes to go home  PT Goal Formulation: With patient Time For Goal Achievement: 01/15/15 Potential to Achieve Goals: Good Progress towards PT goals: Progressing toward goals    Frequency  Min 2X/week    PT Plan Current plan remains appropriate    Co-evaluation  End of Session Equipment Utilized During Treatment: Gait belt;Oxygen Activity Tolerance: Patient limited by fatigue Patient left: with bed alarm set;with family/visitor present;with call bell/phone within reach     Time: 7445-1460 PT Time Calculation (min) (ACUTE ONLY): 31 min  Charges:                       G CodesJanyth Contes 01-13-15, 3:03 PM  Janyth Contes, SPT. [phone]

## 2015-01-03 LAB — GLUCOSE, CAPILLARY
GLUCOSE-CAPILLARY: 183 mg/dL — AB (ref 65–99)
Glucose-Capillary: 146 mg/dL — ABNORMAL HIGH (ref 65–99)
Glucose-Capillary: 202 mg/dL — ABNORMAL HIGH (ref 65–99)
Glucose-Capillary: 182 mg/dL — ABNORMAL HIGH (ref 65–99)

## 2015-01-03 LAB — BASIC METABOLIC PANEL WITH GFR
Anion gap: 6 (ref 5–15)
BUN: 64 mg/dL — ABNORMAL HIGH (ref 6–20)
CO2: 29 mmol/L (ref 22–32)
Calcium: 8.9 mg/dL (ref 8.9–10.3)
Chloride: 106 mmol/L (ref 101–111)
Creatinine, Ser: 1.76 mg/dL — ABNORMAL HIGH (ref 0.44–1.00)
GFR calc Af Amer: 30 mL/min — ABNORMAL LOW
GFR calc non Af Amer: 26 mL/min — ABNORMAL LOW
Glucose, Bld: 168 mg/dL — ABNORMAL HIGH (ref 65–99)
Potassium: 4.3 mmol/L (ref 3.5–5.1)
Sodium: 141 mmol/L (ref 135–145)

## 2015-01-03 MED ORDER — FUROSEMIDE 40 MG PO TABS: ORAL_TABLET | ORAL | Status: DC

## 2015-01-03 MED ORDER — METOPROLOL TARTRATE 25 MG PO TABS: 12.5000 mg | ORAL_TABLET | Freq: Two times a day (BID) | ORAL | Status: DC

## 2015-01-03 NOTE — Discharge Summary (Addendum)
[facility] NAME: Jill York    MR#:  [id number]  DATE OF BIRTH:  June 12, 1931  DATE OF ADMISSION:  01/01/2015 ADMITTING PHYSICIAN: Loletha Grayer, MD  DATE OF DISCHARGE: 01/04/2015  PRIMARY CARE PHYSICIAN: Sharyne Peach, MD    ADMISSION DIAGNOSIS:  Shortness of breath [R06.02] Acute exacerbation of CHF (congestive heart failure) [I50.9] COPD with acute exacerbation [J44.1] Fall at home [W19.Merril Abbe, I34.742]  DISCHARGE DIAGNOSIS:  Active Problems:   Acute respiratory failure with hypoxia   SECONDARY DIAGNOSIS:   Past Medical History  Diagnosis Date  . CHF (congestive heart failure)   . Thyroid disease   . Diabetes mellitus without complication   . Hypertension   . Hyperlipidemia   . Arrhythmia     PVC  . COPD (chronic obstructive pulmonary disease)   . Chronic kidney disease   . OSA (obstructive sleep apnea) 2011  . Anemia   . Anxiety and depression     [name]  . Lymphoma   . Osteoarthritis     HOSPITAL COURSE:   1. acute respiratory failure with hypoxia, now chronic. The patient's pulse ox in the ER dropped down into the 70s with ambulation. Patient required oxygen supplementation. Upon discharge pulse ox dropped down to 85 while walking on room air. Patient will need 2 L nasal cannula continuous. Prior to leaving rehabilitation can check if still qualifies for home oxygen. 2. Acute combined systolic and diastolic congestive heart failure. The patient was diuresed with IV Lasix. Switched over to by mouth Lasix 60 mg daily. Low-dose metoprolol was added. She is on Cozaar. 3. COPD- no signs of exacerbation but was given some steroids in the emergency room. 4. Hyperlipidemia unspecified- continue statin. 5. Chronic kidney disease stage III- watch closely with diuresis. 6. Hypothyroidism unspecified continue levothyroxine. 7. Hypertension essential continue current  medications. 8. Anxiety depression no changes in medications. 9. Type 2 diabetes with hyperglycemia- sugars were high with the steroids given in the emergency room. 10. Chronic atrial fibrillation- was rapid on presentation now rate controlled on metoprolol area and patient is anticoagulated with Eliquis. 11. Weakness - physical therapy recommended rehabilitation.   DISCHARGE CONDITIONS:   Satisfactory  CONSULTS OBTAINED:  Treatment Team:  Corey Skains, MD Teodoro Spray, MD  DRUG ALLERGIES:   Allergies  Allergen Reactions  . Demerol [Meperidine] Diarrhea and Nausea And Vomiting  . Lisinopril Cough  . Tape Rash    DISCHARGE MEDICATIONS:   Current Discharge Medication List    START taking these medications   Details  metoprolol tartrate (LOPRESSOR) 25 MG tablet Take 0.5 tablets (12.5 mg total) by mouth 2 (two) times daily. Qty: 30 tablet, Refills: 0      CONTINUE these medications which have CHANGED   Details  furosemide (LASIX) 40 MG tablet Take 1.5 tables daily (total 60mg ) Qty: 45 tablet, Refills: 0      CONTINUE these medications which have NOT CHANGED   Details  albuterol (PROVENTIL HFA;VENTOLIN HFA) 108 (90 BASE) MCG/ACT inhaler Inhale 1 puff into the lungs every 6 (six) hours as needed for wheezing or shortness of breath.    amLODipine (NORVASC) 5 MG tablet Take 5 mg by mouth daily.    apixaban (ELIQUIS) 2.5 MG TABS tablet Take 2.5 mg by mouth 2 (two) times daily.    aspirin EC 81 MG tablet Take 81 mg by mouth daily.    atorvastatin (LIPITOR) 80 MG  tablet Take 80 mg by mouth daily.    baclofen (LIORESAL) 10 MG tablet Take 5 mg by mouth at bedtime as needed for muscle spasms.    calcitRIOL (ROCALTROL) 0.25 MCG capsule Take 0.25 mcg by mouth every Monday, Wednesday, and Friday.    Calcium Carbonate-Vitamin D 600-400 MG-UNIT per tablet Take 1 tablet by mouth 2 (two) times daily with a meal.    Cholecalciferol 1000 UNITS tablet Take 1,000 Units by  mouth daily.    docusate sodium (COLACE) 100 MG capsule Take 100-200 mg by mouth daily as needed for mild constipation or moderate constipation.    esomeprazole (NEXIUM) 20 MG capsule Take 20 mg by mouth daily as needed (for acid reflux).    fluticasone (FLONASE) 50 MCG/ACT nasal spray Place 1 spray into both nostrils daily.    Fluticasone-Salmeterol (ADVAIR) 250-50 MCG/DOSE AEPB Inhale 1 puff into the lungs 2 (two) times daily.    gabapentin (NEURONTIN) 300 MG capsule Take 300 mg by mouth 2 (two) times daily.    insulin glargine (LANTUS) 100 UNIT/ML injection Inject 40 Units into the skin at bedtime.     insulin lispro (HUMALOG) 100 UNIT/ML injection Inject 18-20 Units into the skin 3 (three) times daily before meals.     levothyroxine (SYNTHROID, LEVOTHROID) 137 MCG tablet Take 137 mcg by mouth daily before breakfast. Take with a glass of water at least 30 to 60 minutes before breakfast    losartan (COZAAR) 100 MG tablet Take 100 mg by mouth daily.    montelukast (SINGULAIR) 10 MG tablet Take 10 mg by mouth at bedtime.    traMADol (ULTRAM) 50 MG tablet Take 50 mg by mouth every 12 (twelve) hours as needed for moderate pain or severe pain.     venlafaxine (EFFEXOR) 75 MG tablet Take 75 mg by mouth 2 (two) times daily.         DISCHARGE INSTRUCTIONS:   Follow-up with Dr. rehabilitation 1 day Follow-up with Dr. Nehemiah Massed 1 week.  If you experience worsening of your admission symptoms, develop shortness of breath, life threatening emergency, suicidal or homicidal thoughts you must seek medical attention immediately by calling 911 or calling your MD immediately  if symptoms less severe.  You Must read complete instructions/literature along with all the possible adverse reactions/side effects for all the Medicines you take and that have been prescribed to you. Take any new Medicines after you have completely understood and accept all the possible adverse reactions/side effects.    Please note  You were cared for by a hospitalist during your hospital stay. If you have any questions about your discharge medications or the care you received while you were in the hospital after you are discharged, you can call the unit and asked to speak with the hospitalist on call if the hospitalist that took care of you is not available. Once you are discharged, your primary care physician will handle any further medical issues. Please note that NO REFILLS for any discharge medications will be authorized once you are discharged, as it is imperative that you return to your primary care physician (or establish a relationship with a primary care physician if you do not have one) for your aftercare needs so that they can reassess your need for medications and monitor your lab values.    Today   CHIEF COMPLAINT:   Chief Complaint  Patient presents with  . Shortness of Breath    HISTORY OF PRESENT ILLNESS:  Jill York  is a 81  y.o. female with a known history of CHF presented with acute CHF exacerbation and found to have acute respiratory failure.   VITAL SIGNS:  Blood pressure 125/40, pulse 76, temperature 98 F (36.7 C), temperature source Oral, resp. rate 22, height 5\' 4"  (1.626 m), weight 105.87 kg (233 lb 6.4 oz), SpO2 100 %.  I/O:    Intake/Output Summary (Last 24 hours) at 01/04/15 0837 Last data filed at 01/04/15 0559  Gross per 24 hour  Intake    480 ml  Output   1600 ml  Net  -1120 ml    PHYSICAL EXAMINATION:  GENERAL:  81 y.o.-year-old patient lying in the bed with no acute distress.  EYES: Pupils equal, round, reactive to light and accommodation. No scleral icterus. Extraocular muscles intact.  HEENT: Head atraumatic, normocephalic. Oropharynx and nasopharynx clear.  NECK:  Supple, no jugular venous distention. No thyroid enlargement, no tenderness.  LUNGS: Good breath sounds bilateral , no wheezing, rales,rhonchi or crepitation. No use of accessory muscles  of respiration.  CARDIOVASCULAR: S1, S2 irregularly irregular. No murmurs, rubs, or gallops.  ABDOMEN: Soft, non-tender, non-distended. Bowel sounds present. No organomegaly or mass.  EXTREMITIES: Trace edema, no cyanosis, or clubbing.  NEUROLOGIC: Cranial nerves II through XII are intact. Muscle strength 5/5 in all extremities. Sensation intact. Gait not checked.  PSYCHIATRIC: The patient is alert and oriented x 3.  SKIN: No obvious rash, lesion, or ulcer.   DATA REVIEW:   CBC  Recent Labs Lab 01/02/15 0433  WBC 9.1  HGB 9.2*  HCT 29.7*  PLT 166    Chemistries   Recent Labs Lab 01/01/15 0635  01/04/15 0342  NA 141  < > 140  K 4.5  < > 4.8  CL 104  < > 107  CO2 26  < > 28  GLUCOSE 159*  < > 294*  BUN 36*  < > 69*  CREATININE 1.76*  < > 1.92*  CALCIUM 8.9  < > 8.6*  AST 22  --   --   ALT 27  --   --   ALKPHOS 81  --   --   BILITOT 0.8  --   --   < > = values in this interval not displayed.     Management plans discussed with the patient, and she is in agreement.  CODE STATUS:     Code Status Orders        Start     Ordered   01/01/15 1029  Do not attempt resuscitation (DNR)   Continuous    Question Answer Comment  In the event of cardiac or respiratory ARREST Do not call a "code blue"   In the event of cardiac or respiratory ARREST Do not perform Intubation, CPR, defibrillation or ACLS   In the event of cardiac or respiratory ARREST Use medication by any route, position, wound care, and other measures to relive pain and suffering. May use oxygen, suction and manual treatment of airway obstruction as needed for comfort.   Comments nurse may pronounce      01/01/15 1029    Advance Directive Documentation        Most Recent Value   Type of Advance Directive  Living will   Pre-existing out of facility DNR order (yellow form or pink MOST form)     "MOST" Form in Place?        TOTAL TIME TAKING CARE OF THIS PATIENT: 40 minutes.    Loletha Grayer M.D  on 01/04/2015  at 8:30 AM  Between 7am to 6pm - Pager - [phone]  After 6pm go to [email or web address] - password [email or web address] [facility]  Office  [phone]  CC: Primary care physician; Sharyne Peach, MD

## 2015-01-03 NOTE — Clinical Social Work Note (Signed)
CSW spoke to [facility].  They can accept pt tomorrow.

## 2015-01-03 NOTE — Care Management Important Message (Signed)
Important Message  Patient Details  Name: Jill York MRN: [id number] Date of Birth: [birth year]   Medicare Important Message Given:  Yes-second notification given    [name] 01/03/2015, 10:16 AM

## 2015-01-03 NOTE — Discharge Instructions (Signed)
Acute Respiratory Failure °Respiratory failure is when your lungs are not working well and your breathing (respiratory) system fails. When respiratory failure occurs, it is difficult for your lungs to get enough oxygen, get rid of carbon dioxide, or both. Respiratory failure can be life threatening.  °Respiratory failure can be acute or chronic. Acute respiratory failure is sudden, severe, and requires emergency medical treatment. Chronic respiratory failure is less severe, happens over time, and requires ongoing treatment.  °WHAT ARE THE CAUSES OF ACUTE RESPIRATORY FAILURE?  °Any problem affecting the heart or lungs can cause acute respiratory failure. Some of these causes include the following: °· Chronic bronchitis and emphysema (COPD).   °· Blood clot going to a lung (pulmonary embolism).   °· Having water in the lungs caused by heart failure, lung injury, or infection (pulmonary edema).   °· Collapsed lung (pneumothorax).   °· Pneumonia.   °· Pulmonary fibrosis.   °· Obesity.   °· Asthma.   °· Heart failure.   °· Any type of trauma to the chest that can make breathing difficult.   °· Nerve or muscle diseases making chest movements difficult. °WHAT SYMPTOMS SHOULD YOU WATCH FOR?  °If you have any of these signs or symptoms, you should seek immediate medical care:  °· You have shortness of breath (dyspnea) with or without activity.   °· You have rapid, fast breathing (tachypnea).   °· You are wheezing. °· You are unable to say more than a few words without having to catch your breath. °· You find it very difficult to function normally. °· You have a fast heart rate.   °· You have a bluish color to your finger or toe nail beds.   °· You have confusion or drowsiness or both.   °HOW WILL MY ACUTE RESPIRATORY FAILURE BE TREATED?  °Treatment of acute respiratory failure depends on the cause of the respiratory failure. Usually, you will stay in the intensive care unit so your breathing can be watched closely. Treatment  can include the following: °· Oxygen. Oxygen can be delivered through the following: °¨ Nasal cannula. This is small tubing that goes in your nose to give you oxygen. °¨ Face mask. A face mask covers your nose and mouth to give you oxygen. °· Medicine. Different medicines can be given to help with breathing. These can include: °¨ Nebulizers. Nebulizers deliver medicines to open the air passages (bronchodilators). These medicines help to open or relax the airways in the lungs so you can breathe better. They can also help loosen mucus from your lungs. °¨ Diuretics. Diuretic medicines can help you breathe better by getting rid of extra water in your body. °¨ Steroids. Steroid medicines can help decrease swelling (inflammation) in your lungs. °¨ Antibiotics. °· Chest tube. If you have a collapsed lung (pneumothorax), a chest tube is placed to help reinflate the lung. °· Non-invasive positive pressure ventilation (NPPV). This is a tight-fitting mask that goes over your nose and mouth. The mask has tubing that is attached to a machine. The machine blows air into the tubing, which helps to keep the tiny air sacs (alveoli) in your lungs open. This machine allows you to breathe on your own. °· Ventilator. A ventilator is a breathing machine. When on a ventilator, a breathing tube is put into the lungs. A ventilator is used when you can no longer breathe well enough on your own. You may have low oxygen levels or high carbon dioxide (CO2) levels in your blood. When you are on a ventilator, sedation and pain medicines are given to make you sleep   so your lungs can heal. °Document Released: 06/15/2013 Document Revised: 10/25/2013 Document Reviewed: 06/15/2013 °ExitCare® Patient Information ©2015 [employer]. This information is not intended to replace advice given to you by your health care provider. Make sure you discuss any questions you have with your health care provider. ° °

## 2015-01-04 LAB — BASIC METABOLIC PANEL
Anion gap: 5 (ref 5–15)
BUN: 69 mg/dL — AB (ref 6–20)
CALCIUM: 8.6 mg/dL — AB (ref 8.9–10.3)
CO2: 28 mmol/L (ref 22–32)
Chloride: 107 mmol/L (ref 101–111)
Creatinine, Ser: 1.92 mg/dL — ABNORMAL HIGH (ref 0.44–1.00)
GFR calc Af Amer: 27 mL/min — ABNORMAL LOW (ref >60)
GFR, EST NON AFRICAN AMERICAN: 23 mL/min — AB (ref >60)
Glucose, Bld: 294 mg/dL — ABNORMAL HIGH (ref 65–99)
Potassium: 4.8 mmol/L (ref 3.5–5.1)
Sodium: 140 mmol/L (ref 135–145)

## 2015-01-04 LAB — GLUCOSE, CAPILLARY: GLUCOSE-CAPILLARY: 219 mg/dL — AB (ref 65–99)

## 2015-01-04 NOTE — Clinical Social Work Note (Signed)
CSW notified RN, pt and pt's daughter that pt will DC to [facility] today.  Daughter will transport.  CSW signing off

## 2015-01-04 NOTE — Progress Notes (Signed)
Patient ID: Jill York, female   DOB: Nov 26, 1930, 81 y.o.   MRN: [id number] [facility] PROGRESS NOTE  PCP: Sharyne Peach, MD  HPI/Subjective: Patient feeling better. She still has a little bit of shortness of breath and cough. Agreeable to rehabilitation.  Objective: Temperature 97.5 blood pressure 148/49 pulse 93 respirations 16  ROS: Review of Systems  Constitutional: Negative for fever and chills.  Eyes: Negative for blurred vision.  Respiratory: Positive for cough and shortness of breath. Negative for sputum production.   Cardiovascular: Negative for chest pain.  Gastrointestinal: Negative for nausea, vomiting, abdominal pain, diarrhea and constipation.  Genitourinary: Negative for dysuria.  Musculoskeletal: Negative for joint pain.  Neurological: Negative for dizziness and headaches.   Exam: Physical Exam  Constitutional: She is oriented to person, place, and time.  HENT:  Nose: No mucosal edema.  Mouth/Throat: No oropharyngeal exudate or posterior oropharyngeal edema.  Eyes: Conjunctivae, EOM and lids are normal. Pupils are equal, round, and reactive to light.  Neck: No JVD present. Carotid bruit is not present. No edema present. No thyroid mass and no thyromegaly present.  Cardiovascular: S1 normal and S2 normal.  An irregularly irregular rhythm present. Exam reveals no gallop.   No murmur heard. Pulses:      Dorsalis pedis pulses are 2+ on the right side, and 2+ on the left side.  Respiratory: No respiratory distress. She has no wheezes. She has no rhonchi. She has rales in the right lower field and the left lower field.  GI: Soft. Bowel sounds are normal. There is no tenderness.  Musculoskeletal:       Right ankle: She exhibits swelling.       Left ankle: She exhibits swelling.  Lymphadenopathy:    She has no cervical adenopathy.  Neurological: She is alert and oriented to person, place, and time. No cranial nerve deficit.  Skin: Skin is warm.  No rash noted. Nails show no clubbing.  Psychiatric: She has a normal mood and affect.     Scheduled Meds: . albuterol  2.5 mg Nebulization Q6H  . amLODipine  5 mg Oral Daily  . apixaban  2.5 mg Oral BID  . aspirin EC  81 mg Oral Daily  . atorvastatin  80 mg Oral Daily  . calcitRIOL  0.25 mcg Oral Q M,W,F  . calcium-vitamin D  1 tablet Oral BID WC  . cholecalciferol  1,000 Units Oral Daily  . fluticasone  1 spray Each Nare Daily  . furosemide  60 mg Oral Daily  . gabapentin  300 mg Oral BID  . insulin aspart  0-20 Units Subcutaneous TID WC  . insulin aspart  0-5 Units Subcutaneous QHS  . insulin aspart  6 Units Subcutaneous TID WC  . insulin glargine  40 Units Subcutaneous QHS  . levothyroxine  137 mcg Oral QAC breakfast  . losartan  100 mg Oral Daily  . metoprolol tartrate  12.5 mg Oral BID  . mometasone-formoterol  2 puff Inhalation BID  . montelukast  10 mg Oral QHS  . pantoprazole  40 mg Oral Daily  . sodium chloride  3 mL Intravenous Q12H  . venlafaxine  75 mg Oral BID    Assessment/Plan:  1. Acute respiratory failure. Pulse ox dropped to 85% with ambulation. [facility] rehabilitation with oxygen supplementation 2. Acute combined systolic and diastolic congestive heart failure. Low-dose metoprolol added. Continue Lasix 60 mg daily secondary to chronic kidney disease stage III. 3. Weakness- physical therapy recommended rehabilitation. Patient  now agreeable to rehabilitation 4. COPD- continue inhalers 5. Atrial fibrillation- now rate controlled on metoprolol. Anticoagulated with Eliquis.  6. Diabetes type 2- continue Lantus and sliding scale. 7. Chronic kidney disease stage III- watch creatinine with diuresis. 8. Essential hypertension continue usual medications.  Code Status:     Code Status Orders        Start     Ordered   01/01/15 1029  Do not attempt resuscitation (DNR)   Continuous    Question Answer Comment  In the event of cardiac or respiratory ARREST  Do not call a "code blue"   In the event of cardiac or respiratory ARREST Do not perform Intubation, CPR, defibrillation or ACLS   In the event of cardiac or respiratory ARREST Use medication by any route, position, wound care, and other measures to relive pain and suffering. May use oxygen, suction and manual treatment of airway obstruction as needed for comfort.   Comments nurse may pronounce      01/01/15 1029    Advance Directive Documentation        Most Recent Value   Type of Advance Directive  Living will   Pre-existing out of facility DNR order (yellow form or pink MOST form)     "MOST" Form in Place?        Disposition Plan: Rehabilitation, after 3 nights stay required by insurance.  Time spent: 30 minutes.  Loletha Grayer  [facility]

## 2015-01-05 ENCOUNTER — Telehealth: Payer: Self-pay

## 2015-01-05 DIAGNOSIS — I482 Chronic atrial fibrillation: Secondary | ICD-10-CM | POA: Diagnosis not present

## 2015-01-05 DIAGNOSIS — F411 Generalized anxiety disorder: Secondary | ICD-10-CM

## 2015-01-05 DIAGNOSIS — N183 Chronic kidney disease, stage 3 (moderate): Secondary | ICD-10-CM

## 2015-01-05 DIAGNOSIS — E1142 Type 2 diabetes mellitus with diabetic polyneuropathy: Secondary | ICD-10-CM | POA: Diagnosis not present

## 2015-01-05 DIAGNOSIS — J439 Emphysema, unspecified: Secondary | ICD-10-CM | POA: Diagnosis not present

## 2015-01-05 DIAGNOSIS — I5043 Acute on chronic combined systolic (congestive) and diastolic (congestive) heart failure: Secondary | ICD-10-CM | POA: Diagnosis not present

## 2015-01-05 NOTE — Telephone Encounter (Signed)
Dr. Silvio Pate saw pt at [facility] this morning and has taken care of the orders per med tech.

## 2015-01-05 NOTE — Telephone Encounter (Signed)
Dr Silvio Pate not in office until 2 PM today; sending phone note to Dr Silvio Pate and Webb Silversmith NP since [facility] pt.

## 2015-01-05 NOTE — Telephone Encounter (Signed)
PLEASE NOTE: All timestamps contained within this report are represented as [place] Standard Time. CONFIDENTIALTY NOTICE: This fax transmission is intended only for the addressee. It contains information that is legally privileged, confidential or otherwise protected from use or disclosure. If you are not the intended recipient, you are strictly prohibited from reviewing, disclosing, copying using or disseminating any of this information or taking any action in reliance on or regarding this information. If you have received this fax in error, please notify us immediately by telephone so that we can arrange for its return to [place]. Phone: [phone], Toll-Free: [phone], Fax: [phone] Page: 1 of 2 Call Id: [id number] Craig Patient Name: Jill York Gender: Unknown DOB: 1930-11-03 Age: 81 Y 52 M 9 D Return Phone Number: Address: City/State/Zip: [place] Client [place] Night - Client Client Site [place] Physician Viviana Simpler Contact Type Call Call Type Triage / Pilot Station Name [place] Relationship To Patient [place] Return Phone Number Please choose phone number Chief Complaint Paging or Request for Consult Initial Comment Caller Webb Silversmith from [facility] states the PT is a diabetic. Was sent with hospital orders for Lispro and Lantis. The nurse on day shift left off the Lispro. It is a sliding scale 18-20 units before dinner. She has a Lantis order for 40 units at bedtime. She has had two meals and hasn't had any Lispro. Her current BS is 320. She needs an order to give the Lispro. CB# [id number] PreDisposition Call Doctor Nurse Assessment Nurse: Donovan Kail, RN, Barnetta Chapel Date/Time Eilene Ghazi Time): 01/04/2015 8:58:03 PM Confirm and document reason for call. If symptomatic, describe symptoms. ---Lispro and  Lantis. The nurse on day shift left off the Lispro. It is a sliding scale 18-20 units before dinner. She has a Lantis order for 40 units at bedtime. She has had two meals and hasn't had any Lispro. Her current BS is 320. She needs an order to give the Lispro. CB# [id number]. She came from hospital on first shift. Lispro 18-20 units. order not copied into the chart. Has the patient traveled out of the country within the last 30 days? ---Not Applicable Does the patient require triage? ---Yes Related visit to physician within the last 2 weeks? ---Yes Does the PT have any chronic conditions? (i.e. diabetes, asthma, etc.) ---Yes List chronic conditions. ---acute repiratory failure, COPD, acute systolic and diastolicCHF, lasix 60 mg QD, metroprolol, cozaar, stage 3 kidney disease, hyperlipidemia, hypothyroid, HTN, anxiety depression, type II diabetes. Steroids given in ER. Guidelines Guideline Title Affirmed Question Affirmed Notes Nurse Date/Time (Eastern Time) Diabetes - High Blood Sugar [1] Blood glucose > 240 mg/dl (13 mmol/l) AND [2] does not use insulin (e.g., not insulinDonovan Kail, RN, Barnetta Chapel 01/04/2015 9:00:03 PM PLEASE NOTE: All timestamps contained within this report are represented as [place] Standard Time. CONFIDENTIALTY NOTICE: This fax transmission is intended only for the addressee. It contains information that is legally privileged, confidential or otherwise protected from use or disclosure. If you are not the intended recipient, you are strictly prohibited from reviewing, disclosing, copying using or disseminating any of this information or taking any action in reliance on or regarding this information. If you have received this fax in error, please notify us immediately by telephone so that we can arrange for its return to [place]. Phone: [phone], Toll-Free: [phone], Fax: [phone] Page: 2 of 2 Call Id: [id number] Guidelines Guideline  Title Affirmed Question  Affirmed Notes Nurse Date/Time (Eastern Time) dependent; most type 2 diabetics) (all triage questions negative) Disp. Time Eilene Ghazi Time) Disposition Final User 01/04/2015 9:10:27 PM Home Care Yes Donovan Kail, RN, Ledell Noss Understands: Yes Disagree/Comply: Comply Care Advice Given Per Guideline HOME CARE: You should be able to treat this at home. TREATMENT - LIQUIDS: * Drink at least one glass (8 oz) of water per hour for the next 4 hours (Reason: adequate hydration will reduce hyperglycemia). CALL BACK IF: * Blood glucose over 300 mg/ dL (16.5 mmol/l), two or more times in a row. * Urine ketones become moderate or large * Vomiting lasting over 4 hours or unable to drink any fluids * Rapid breathing occurs * You become worse or have more questions. CARE ADVICE given per Diabetes - High Blood Sugar (Adult) guideline. After Care Instructions Given Call Event Type User Date / Time Description

## 2015-01-05 NOTE — Telephone Encounter (Signed)
I did see her today and handled all orders

## 2015-01-05 NOTE — Telephone Encounter (Signed)
[place] for order for Lispro as stated on Med list

## 2015-01-26 ENCOUNTER — Encounter: Payer: Self-pay | Admitting: Emergency Medicine

## 2015-01-26 ENCOUNTER — Emergency Department
Admission: EM | Admit: 2015-01-26 | Discharge: 2015-01-26 | Disposition: A | Discharge status: Home / Self Care | Payer: Medicare Other | Attending: Emergency Medicine | Admitting: Emergency Medicine

## 2015-01-26 DIAGNOSIS — Z792 Long term (current) use of antibiotics: Secondary | ICD-10-CM | POA: Insufficient documentation

## 2015-01-26 DIAGNOSIS — Z794 Long term (current) use of insulin: Secondary | ICD-10-CM | POA: Diagnosis not present

## 2015-01-26 DIAGNOSIS — E119 Type 2 diabetes mellitus without complications: Secondary | ICD-10-CM | POA: Diagnosis not present

## 2015-01-26 DIAGNOSIS — R05 Cough: Secondary | ICD-10-CM | POA: Insufficient documentation

## 2015-01-26 DIAGNOSIS — Z87891 Personal history of nicotine dependence: Secondary | ICD-10-CM | POA: Diagnosis not present

## 2015-01-26 DIAGNOSIS — Z79899 Other long term (current) drug therapy: Secondary | ICD-10-CM | POA: Diagnosis not present

## 2015-01-26 DIAGNOSIS — I129 Hypertensive chronic kidney disease with stage 1 through stage 4 chronic kidney disease, or unspecified chronic kidney disease: Secondary | ICD-10-CM | POA: Insufficient documentation

## 2015-01-26 DIAGNOSIS — Z79811 Long term (current) use of aromatase inhibitors: Secondary | ICD-10-CM | POA: Diagnosis not present

## 2015-01-26 DIAGNOSIS — Z7982 Long term (current) use of aspirin: Secondary | ICD-10-CM | POA: Diagnosis not present

## 2015-01-26 DIAGNOSIS — Z008 Encounter for other general examination: Secondary | ICD-10-CM | POA: Diagnosis present

## 2015-01-26 DIAGNOSIS — Z7902 Long term (current) use of antithrombotics/antiplatelets: Secondary | ICD-10-CM | POA: Insufficient documentation

## 2015-01-26 DIAGNOSIS — Z7951 Long term (current) use of inhaled steroids: Secondary | ICD-10-CM | POA: Insufficient documentation

## 2015-01-26 DIAGNOSIS — N189 Chronic kidney disease, unspecified: Secondary | ICD-10-CM | POA: Diagnosis not present

## 2015-01-26 DIAGNOSIS — N289 Disorder of kidney and ureter, unspecified: Secondary | ICD-10-CM

## 2015-01-26 HISTORY — DX: Lymphedema, not elsewhere classified: I89.0

## 2015-01-26 LAB — BASIC METABOLIC PANEL
ANION GAP: 11 (ref 5–15)
BUN: 54 mg/dL — ABNORMAL HIGH (ref 6–20)
CHLORIDE: 105 mmol/L (ref 101–111)
CO2: 26 mmol/L (ref 22–32)
Calcium: 9.4 mg/dL (ref 8.9–10.3)
Creatinine, Ser: 1.83 mg/dL — ABNORMAL HIGH (ref 0.44–1.00)
GFR calc Af Amer: 28 mL/min — ABNORMAL LOW (ref >60)
GFR, EST NON AFRICAN AMERICAN: 25 mL/min — AB (ref >60)
GLUCOSE: 206 mg/dL — AB (ref 65–99)
Potassium: 4.2 mmol/L (ref 3.5–5.1)
SODIUM: 142 mmol/L (ref 135–145)

## 2015-01-26 LAB — CBC
HCT: 29.5 % — ABNORMAL LOW (ref 35.0–47.0)
Hemoglobin: 9.2 g/dL — ABNORMAL LOW (ref 12.0–16.0)
MCH: 29.7 pg (ref 26.0–34.0)
MCHC: 31.3 g/dL — AB (ref 32.0–36.0)
MCV: 94.9 fL (ref 80.0–100.0)
Platelets: 202 10*3/uL (ref 150–440)
RBC: 3.11 MIL/uL — ABNORMAL LOW (ref 3.80–5.20)
RDW: 17.1 % — ABNORMAL HIGH (ref 11.5–14.5)
WBC: 6.1 10*3/uL (ref 3.6–11.0)

## 2015-01-26 LAB — URINALYSIS COMPLETE WITH MICROSCOPIC (ARMC ONLY)
Bilirubin Urine: NEGATIVE
GLUCOSE, UA: NEGATIVE mg/dL
HGB URINE DIPSTICK: NEGATIVE
Ketones, ur: NEGATIVE mg/dL
NITRITE: POSITIVE — AB
Protein, ur: 30 mg/dL — AB
SPECIFIC GRAVITY, URINE: 1.012 (ref 1.005–1.030)
pH: 5 (ref 5.0–8.0)

## 2015-01-26 LAB — GLUCOSE, CAPILLARY: GLUCOSE-CAPILLARY: 210 mg/dL — AB (ref 65–99)

## 2015-01-26 MED ORDER — SODIUM CHLORIDE 0.9 % IV BOLUS (SEPSIS)
500.0000 mL | Freq: Once | INTRAVENOUS | Status: AC
  Administered 2015-01-26: 500 mL via INTRAVENOUS

## 2015-01-26 MED ORDER — CEPHALEXIN 500 MG PO CAPS: 500.0000 mg | ORAL_CAPSULE | Freq: Two times a day (BID) | ORAL | Status: AC

## 2015-01-26 NOTE — Discharge Instructions (Signed)
End-Stage Kidney Disease The kidneys are two organs that lie on either side of the spine between the middle of the back and the front of the abdomen. The kidneys:   Remove wastes and extra water from the blood.   Produce important hormones. These help keep bones strong, regulate blood pressure, and help create red blood cells.   Balance the fluids and chemicals in the blood and tissues. End-stage kidney disease occurs when the kidneys are so damaged that they cannot do their job. When the kidneys cannot do their job, life-threatening problems occur. The body cannot stay clean and strong without the help of the kidneys. In end-stage kidney disease, the kidneys cannot get better.You need a new kidney or treatments to do some of the work healthy kidneys do in order to stay alive. CAUSES  End-stage kidney disease usually occurs when a long-lasting (chronic) kidney disease gets worse. It may also occur after the kidneys are suddenly damaged (acute kidney injury).  SYMPTOMS   Swelling (edema) of the legs, ankles, or feet.   Tiredness (lethargy).   Nausea or vomiting.   Confusion.   Problems with urination, such as:   Decreased urine production.   Frequent urination, especially at night.   Frequent accidents in children who are potty trained.   Muscle twitches and cramps.   Persistent itchiness.   Loss of appetite.   Headaches.   Abnormally dark or light skin.   Numbness in the hands or feet.   Easy bruising.   Frequent hiccups.   Menstruation stops. DIAGNOSIS  Your health care provider will measure your blood pressure and take some tests. These may include:   Urine tests.   Blood tests.   Imaging tests, such as:   An ultrasound exam.   Computed tomography (CT).  A kidney biopsy. TREATMENT  There are two treatments for end-stage kidney disease:   A procedure that removes toxic wastes from the body (dialysis).   Receiving a new kidney  (kidney transplant). Both of these treatments have serious risks and consequences. Your health care provider will help you determine which treatment is best for you based on your health, age, and other factors. In addition to having dialysis or a kidney transplant, you may need to take medicines to control high blood pressure (hypertension) and cholesterol and to decrease phosphorus levels in your blood.  HOME CARE INSTRUCTIONS  Follow your prescribed diet.   Take medicines only as directed by your health care provider.   Do not take any new medicines (prescription, over-the-counter, or nutritional supplements) unless approved by your health care provider. Many medicines can worsen your kidney damage or need to have the dose adjusted.   Keep all follow-up visits as directed by your health care provider. MAKE SURE YOU:  Understand these instructions.  Will watch your condition.  Will get help right away if you are not doing well or get worse. Document Released: 08/31/2003 Document Revised: 10/25/2013 Document Reviewed: 02/07/2012 [facility] Patient Information 2015 [place], [place]. This information is not intended to replace advice given to you by your health care provider. Make sure you discuss any questions you have with your health care provider.  He should decrease your Lasix to 80 mg a day total. Please contact Dr. Cleon Dew office for follow-up. Urine culture is pending. Return especially for increased pain, shortness of breath, fever, or any other new concerns.

## 2015-01-26 NOTE — ED Provider Notes (Signed)
[facility] Emergency Department Provider Note  ____________________________________________  Time seen: 36  I have reviewed the triage vital signs and the nursing notes.   HISTORY  Chief Complaint Dehydration    HPI Jill York is a 81 y.o. female who was referred here to the emergency department for evaluation of dehydration. Since been on Lasix at 80 mg twice a day and had lab work performed yesterday by her primary physician. Her baseline creatinine usually is around 1.5 and the patient's was noted to have a creatinine of over 2 in the office. Patient has chronic diuretic therapy secondary to pulmonary edema and hypertension. Patient otherwise does not have any physical complaints. She only reports some mild diffuse weakness and has continued to be on her normal 2 L of O2 at home without any significant shortness of breath. She denies any fever, chills, productive cough.    Past Medical History  Diagnosis Date  . CHF (congestive heart failure)   . Thyroid disease   . Diabetes mellitus without complication   . Hypertension   . Hyperlipidemia   . Arrhythmia     PVC  . COPD (chronic obstructive pulmonary disease)   . Chronic kidney disease   . OSA (obstructive sleep apnea) 2011  . Anemia   . Anxiety and depression     [name]  . Lymphoma   . Osteoarthritis   . Lymphedema     Patient Active Problem List   Diagnosis Date Noted  . Acute respiratory failure with hypoxia 01/01/2015  . Chronic diastolic heart failure 18/56/3149    Past Surgical History  Procedure Laterality Date  . Tonsillectomy and adenoidectomy    . Appendectomy    . Knee surgery Left   . Abdominal hysterectomy    . Cardiac catheterization    . Coronary angioplasty      x4  . Cardiac stents      Current Outpatient Rx  Name  Route  Sig  Dispense  Refill  . amLODipine (NORVASC) 5 MG tablet   Oral   Take 5 mg by mouth daily.          Marland Kitchen apixaban (ELIQUIS) 2.5 MG TABS tablet   Oral   Take 2.5 mg by mouth 2 (two) times daily.         Marland Kitchen aspirin EC 81 MG tablet   Oral   Take 81 mg by mouth daily.         Marland Kitchen atorvastatin (LIPITOR) 80 MG tablet   Oral   Take 80 mg by mouth daily.         . calcitRIOL (ROCALTROL) 0.25 MCG capsule   Oral   Take 0.25 mcg by mouth every Monday, Wednesday, and Friday.         . Calcium Carbonate-Vitamin D 600-400 MG-UNIT per tablet   Oral   Take 1 tablet by mouth 2 (two) times daily with a meal.         . Cholecalciferol 1000 UNITS tablet   Oral   Take 1,000 Units by mouth daily.         Marland Kitchen docusate sodium (COLACE) 100 MG capsule   Oral   Take 100-200 mg by mouth daily as needed for mild constipation or moderate constipation.         Marland Kitchen esomeprazole (NEXIUM) 20 MG capsule   Oral   Take 20 mg by mouth daily as needed (for acid reflux).         Marland Kitchen  fluticasone (FLONASE) 50 MCG/ACT nasal spray   Each Nare   Place 1 spray into both nostrils daily.         . Fluticasone-Salmeterol (ADVAIR) 250-50 MCG/DOSE AEPB   Inhalation   Inhale 1 puff into the lungs 2 (two) times daily.         . furosemide (LASIX) 40 MG tablet      Take 1.5 tables daily (total 60mg )   45 tablet   0   . gabapentin (NEURONTIN) 300 MG capsule   Oral   Take 300 mg by mouth 2 (two) times daily.         . insulin glargine (LANTUS) 100 UNIT/ML injection   Subcutaneous   Inject 40 Units into the skin at bedtime.          . insulin lispro (HUMALOG) 100 UNIT/ML injection   Subcutaneous   Inject 20 Units into the skin 3 (three) times daily before meals.          Marland Kitchen levothyroxine (SYNTHROID, LEVOTHROID) 137 MCG tablet   Oral   Take 137 mcg by mouth daily before breakfast. Take with a glass of water at least 30 to 60 minutes before breakfast         . losartan (COZAAR) 100 MG tablet   Oral   Take 100 mg by mouth daily.         . metoprolol tartrate (LOPRESSOR) 25 MG tablet    Oral   Take 0.5 tablets (12.5 mg total) by mouth 2 (two) times daily.   30 tablet   0   . montelukast (SINGULAIR) 10 MG tablet   Oral   Take 10 mg by mouth at bedtime.         . traMADol (ULTRAM) 50 MG tablet   Oral   Take 50 mg by mouth every 12 (twelve) hours as needed for moderate pain or severe pain.          Marland Kitchen venlafaxine (EFFEXOR) 75 MG tablet   Oral   Take 75 mg by mouth 2 (two) times daily.         Marland Kitchen albuterol (PROVENTIL HFA;VENTOLIN HFA) 108 (90 BASE) MCG/ACT inhaler   Inhalation   Inhale 1 puff into the lungs every 6 (six) hours as needed for wheezing or shortness of breath.         . baclofen (LIORESAL) 10 MG tablet   Oral   Take 5 mg by mouth at bedtime as needed for muscle spasms.         . cephALEXin (KEFLEX) 500 MG capsule   Oral   Take 1 capsule (500 mg total) by mouth 2 (two) times daily.   20 capsule   0     Allergies Demerol; Lisinopril; and Tape  Family History  Problem Relation Age of Onset  . Aneurysm Mother   . Stroke Mother   . Liver cancer Father     Social History History  Substance Use Topics  . Smoking status: Former Smoker -- 1.50 packs/day for 18 years    Quit date: 06/25/1975  . Smokeless tobacco: Never Used  . Alcohol Use: No    Review of Systems  Constitutional: Negative for fever. Eyes: Negative for visual changes. ENT: Negative for sore throat Cardiovascular: Negative for chest pain. Respiratory: Negative for shortness of breath. Gastrointestinal: Negative for abdominal pain, vomiting and diarrhea. Genitourinary: Negative for dysuria. Musculoskeletal: Negative for back pain. Skin: Negative for rash. Neurological: Negative for headaches or focal weakness Psychiatric:  Patient's cough doesn't appear to be any respiratory distress    ____________________________________________   PHYSICAL EXAM:  VITAL SIGNS: ED Triage Vitals  Enc Vitals Group     BP 01/26/15 0903 139/43 mmHg     Pulse Rate 01/26/15  0903 75     Resp 01/26/15 0903 18     Temp 01/26/15 0903 97.7 F (36.5 C)     Temp Source 01/26/15 0903 Oral     SpO2 01/26/15 0903 95 %     Weight 01/26/15 0903 232 lb (105.235 kg)     Height 01/26/15 0903 5\' 4"  (1.626 m)     Head Cir --      Peak Flow --      Pain Score 01/26/15 0904 0     Pain Loc --      Pain Edu? --      Excl. in [place]? --      Constitutional: Alert and oriented. Well appearing and in no distress. Eyes: Conjunctivae are normal.  ENT   Head: Normocephalic and atraumatic.   Mouth/Throat: Mucous membranes are moist. Cardiovascular: Normal rate, regular rhythm. Normal and symmetric distal pulses are present in all extremities. No murmurs, rubs, or gallops. Respiratory: Normal respiratory effort without tachypnea nor retractions. Patient has some mild crackles bilaterally at the bases Gastrointestinal: Soft and non-tender in all quadrants. No distention. There is no CVA tenderness. Genitourinary: deferred Musculoskeletal: Nontender with normal range of motion in all extremities. Mild bilateral lower extremity tenderness nor edema. Neurologic:  Normal speech and language. No gross focal neurologic deficits are appreciated. Skin:  Skin is warm, dry and intact. No rash noted. Psychiatric: Mood and affect are normal. Patient exhibits appropriate insight and judgment.  ____________________________________________    LABS (pertinent positives/negatives)  Labs Reviewed  BASIC METABOLIC PANEL - Abnormal; Notable for the following:    Glucose, Bld 206 (*)    BUN 54 (*)    Creatinine, Ser 1.83 (*)    GFR calc non Af Amer 25 (*)    GFR calc Af Amer 28 (*)    All other components within normal limits  CBC - Abnormal; Notable for the following:    RBC 3.11 (*)    Hemoglobin 9.2 (*)    HCT 29.5 (*)    MCHC 31.3 (*)    RDW 17.1 (*)    All other components within normal limits  URINALYSIS COMPLETEWITH MICROSCOPIC ([facility] ONLY) - Abnormal; Notable for the  following:    Color, Urine YELLOW (*)    APPearance HAZY (*)    Protein, ur 30 (*)    Nitrite POSITIVE (*)    Leukocytes, UA 1+ (*)    Bacteria, UA MANY (*)    Squamous Epithelial / LPF 0-5 (*)    All other components within normal limits  URINE CULTURE   Laboratory work was reviewed and the patient's creatinine level of 1.83 is not significantly different from her last creatinine here of 1.92. Reviewed the case with her primary physician discussion of her laboratory work we felt she could use mild hydration with 500 mL fluid bolus. ____________________________________________   EKG  None  ____________________________________________    RADIOLOGY I have personally reviewed any xrays that were ordered on this patient: No significant radiology findings  ____________________________________________   PROCEDURES  Procedure(s) performed: none  Critical Care performed: None  ____________________________________________   INITIAL IMPRESSION / ASSESSMENT AND PLAN / ED COURSE  Pertinent labs & imaging results that were available during my care of  the patient were reviewed by me and considered in my medical decision making (see chart for details). Patient tolerated her stay here well we felt we could decrease her Lasix to 80 mg a day. Patient was advised to follow-up with her primary physician otherwise and felt she did not require hospitalization at this time. All questions concerns were addressed at the bedside with patient's family.  ____________________________________________   FINAL CLINICAL IMPRESSION(S) / ED DIAGNOSES  Final diagnoses:  Renal insufficiency     Daymon Larsen, MD 01/26/15 1626

## 2015-01-26 NOTE — ED Notes (Signed)
Patient presents to the ED after receiving a call from her doctor this morning.  Patient has been taking lasix and PCP checked kidney function yesterday and told patient this am that she needed to come into the hospital for some fluids due to acute dehydration.  Patient denies pain reports slight weakness.  Patient is alert and oriented x 4.  Patient uses home oxygen.

## 2015-01-29 LAB — URINE CULTURE

## 2015-12-15 ENCOUNTER — Inpatient Hospital Stay
Admission: EM | Admit: 2015-12-15 | Discharge: 2015-12-20 | DRG: 378 | Disposition: A | Discharge status: Skilled Nursing Facility | Payer: Medicare Other | Attending: Internal Medicine | Admitting: Internal Medicine

## 2015-12-15 ENCOUNTER — Encounter: Payer: Self-pay | Admitting: Emergency Medicine

## 2015-12-15 ENCOUNTER — Inpatient Hospital Stay: Payer: Medicare Other

## 2015-12-15 DIAGNOSIS — Z9981 Dependence on supplemental oxygen: Secondary | ICD-10-CM | POA: Diagnosis not present

## 2015-12-15 DIAGNOSIS — K649 Unspecified hemorrhoids: Secondary | ICD-10-CM | POA: Diagnosis present

## 2015-12-15 DIAGNOSIS — Z6841 Body Mass Index (BMI) 40.0 and over, adult: Secondary | ICD-10-CM | POA: Diagnosis not present

## 2015-12-15 DIAGNOSIS — Y92009 Unspecified place in unspecified non-institutional (private) residence as the place of occurrence of the external cause: Secondary | ICD-10-CM | POA: Diagnosis not present

## 2015-12-15 DIAGNOSIS — I252 Old myocardial infarction: Secondary | ICD-10-CM | POA: Diagnosis not present

## 2015-12-15 DIAGNOSIS — I2510 Atherosclerotic heart disease of native coronary artery without angina pectoris: Secondary | ICD-10-CM | POA: Diagnosis present

## 2015-12-15 DIAGNOSIS — Z823 Family history of stroke: Secondary | ICD-10-CM | POA: Diagnosis not present

## 2015-12-15 DIAGNOSIS — Z79899 Other long term (current) drug therapy: Secondary | ICD-10-CM

## 2015-12-15 DIAGNOSIS — Z7901 Long term (current) use of anticoagulants: Secondary | ICD-10-CM | POA: Diagnosis not present

## 2015-12-15 DIAGNOSIS — M25511 Pain in right shoulder: Secondary | ICD-10-CM | POA: Diagnosis present

## 2015-12-15 DIAGNOSIS — R531 Weakness: Secondary | ICD-10-CM

## 2015-12-15 DIAGNOSIS — K921 Melena: Principal | ICD-10-CM | POA: Diagnosis present

## 2015-12-15 DIAGNOSIS — E785 Hyperlipidemia, unspecified: Secondary | ICD-10-CM | POA: Diagnosis present

## 2015-12-15 DIAGNOSIS — N184 Chronic kidney disease, stage 4 (severe): Secondary | ICD-10-CM | POA: Diagnosis present

## 2015-12-15 DIAGNOSIS — D62 Acute posthemorrhagic anemia: Secondary | ICD-10-CM | POA: Diagnosis not present

## 2015-12-15 DIAGNOSIS — Z87891 Personal history of nicotine dependence: Secondary | ICD-10-CM | POA: Diagnosis not present

## 2015-12-15 DIAGNOSIS — E669 Obesity, unspecified: Secondary | ICD-10-CM | POA: Diagnosis present

## 2015-12-15 DIAGNOSIS — R778 Other specified abnormalities of plasma proteins: Secondary | ICD-10-CM

## 2015-12-15 DIAGNOSIS — N189 Chronic kidney disease, unspecified: Secondary | ICD-10-CM

## 2015-12-15 DIAGNOSIS — R441 Visual hallucinations: Secondary | ICD-10-CM | POA: Diagnosis present

## 2015-12-15 DIAGNOSIS — S8265XA Nondisplaced fracture of lateral malleolus of left fibula, initial encounter for closed fracture: Secondary | ICD-10-CM | POA: Diagnosis present

## 2015-12-15 DIAGNOSIS — N179 Acute kidney failure, unspecified: Secondary | ICD-10-CM | POA: Diagnosis present

## 2015-12-15 DIAGNOSIS — Z7982 Long term (current) use of aspirin: Secondary | ICD-10-CM

## 2015-12-15 DIAGNOSIS — E1122 Type 2 diabetes mellitus with diabetic chronic kidney disease: Secondary | ICD-10-CM | POA: Diagnosis present

## 2015-12-15 DIAGNOSIS — H5316 Psychophysical visual disturbances: Secondary | ICD-10-CM | POA: Diagnosis not present

## 2015-12-15 DIAGNOSIS — I130 Hypertensive heart and chronic kidney disease with heart failure and stage 1 through stage 4 chronic kidney disease, or unspecified chronic kidney disease: Secondary | ICD-10-CM | POA: Diagnosis present

## 2015-12-15 DIAGNOSIS — Z96652 Presence of left artificial knee joint: Secondary | ICD-10-CM | POA: Diagnosis present

## 2015-12-15 DIAGNOSIS — R52 Pain, unspecified: Secondary | ICD-10-CM

## 2015-12-15 DIAGNOSIS — I482 Chronic atrial fibrillation: Secondary | ICD-10-CM | POA: Diagnosis present

## 2015-12-15 DIAGNOSIS — Z794 Long term (current) use of insulin: Secondary | ICD-10-CM | POA: Diagnosis not present

## 2015-12-15 DIAGNOSIS — I5032 Chronic diastolic (congestive) heart failure: Secondary | ICD-10-CM | POA: Diagnosis present

## 2015-12-15 DIAGNOSIS — D649 Anemia, unspecified: Secondary | ICD-10-CM

## 2015-12-15 DIAGNOSIS — Z955 Presence of coronary angioplasty implant and graft: Secondary | ICD-10-CM | POA: Diagnosis not present

## 2015-12-15 DIAGNOSIS — Z800 Family history of malignant neoplasm of digestive organs: Secondary | ICD-10-CM | POA: Diagnosis not present

## 2015-12-15 DIAGNOSIS — S51011A Laceration without foreign body of right elbow, initial encounter: Secondary | ICD-10-CM | POA: Diagnosis present

## 2015-12-15 DIAGNOSIS — M25569 Pain in unspecified knee: Secondary | ICD-10-CM

## 2015-12-15 DIAGNOSIS — G4733 Obstructive sleep apnea (adult) (pediatric): Secondary | ICD-10-CM | POA: Diagnosis present

## 2015-12-15 DIAGNOSIS — R7989 Other specified abnormal findings of blood chemistry: Secondary | ICD-10-CM

## 2015-12-15 DIAGNOSIS — J449 Chronic obstructive pulmonary disease, unspecified: Secondary | ICD-10-CM | POA: Diagnosis present

## 2015-12-15 DIAGNOSIS — I214 Non-ST elevation (NSTEMI) myocardial infarction: Secondary | ICD-10-CM | POA: Diagnosis present

## 2015-12-15 DIAGNOSIS — W19XXXA Unspecified fall, initial encounter: Secondary | ICD-10-CM | POA: Diagnosis present

## 2015-12-15 DIAGNOSIS — Z8572 Personal history of non-Hodgkin lymphomas: Secondary | ICD-10-CM

## 2015-12-15 LAB — GLUCOSE, CAPILLARY
GLUCOSE-CAPILLARY: 90 mg/dL (ref 65–99)
Glucose-Capillary: 59 mg/dL — ABNORMAL LOW (ref 65–99)
Glucose-Capillary: 55 mg/dL — ABNORMAL LOW (ref 65–99)
Glucose-Capillary: 70 mg/dL (ref 65–99)
Glucose-Capillary: 123 mg/dL — ABNORMAL HIGH (ref 65–99)
Glucose-Capillary: 197 mg/dL — ABNORMAL HIGH (ref 65–99)

## 2015-12-15 LAB — COMPREHENSIVE METABOLIC PANEL
ALK PHOS: 77 U/L (ref 38–126)
ALT: 30 U/L (ref 14–54)
AST: 34 U/L (ref 15–41)
Albumin: 3.3 g/dL — ABNORMAL LOW (ref 3.5–5.0)
Anion gap: 10 (ref 5–15)
BUN: 49 mg/dL — AB (ref 6–20)
CALCIUM: 8.7 mg/dL — AB (ref 8.9–10.3)
CHLORIDE: 105 mmol/L (ref 101–111)
CO2: 25 mmol/L (ref 22–32)
Creatinine, Ser: 2.1 mg/dL — ABNORMAL HIGH (ref 0.44–1.00)
GFR, EST AFRICAN AMERICAN: 24 mL/min — AB (ref >60)
GFR, EST NON AFRICAN AMERICAN: 21 mL/min — AB (ref >60)
Glucose, Bld: 105 mg/dL — ABNORMAL HIGH (ref 65–99)
Potassium: 4.4 mmol/L (ref 3.5–5.1)
SODIUM: 140 mmol/L (ref 135–145)
Total Bilirubin: 0.7 mg/dL (ref 0.3–1.2)
Total Protein: 6.8 g/dL (ref 6.5–8.1)

## 2015-12-15 LAB — MAGNESIUM: MAGNESIUM: 2.3 mg/dL (ref 1.7–2.4)

## 2015-12-15 LAB — TROPONIN I
TROPONIN I: 0.65 ng/mL — AB (ref <0.031)
TROPONIN I: 0.45 ng/mL — AB (ref <0.031)
Troponin I: 0.77 ng/mL — ABNORMAL HIGH (ref <0.031)

## 2015-12-15 LAB — CBC WITH DIFFERENTIAL/PLATELET
BASOS ABS: 0 10*3/uL (ref 0–0.1)
Basophils Relative: 0 %
EOS ABS: 0.1 10*3/uL (ref 0–0.7)
EOS PCT: 2 %
HCT: 26.1 % — ABNORMAL LOW (ref 35.0–47.0)
Hemoglobin: 8.4 g/dL — ABNORMAL LOW (ref 12.0–16.0)
Lymphocytes Relative: 19 %
Lymphs Abs: 1.5 10*3/uL (ref 1.0–3.6)
MCH: 31 pg (ref 26.0–34.0)
MCHC: 32.3 g/dL (ref 32.0–36.0)
MCV: 96 fL (ref 80.0–100.0)
Monocytes Absolute: 0.4 10*3/uL (ref 0.2–0.9)
Monocytes Relative: 5 %
NEUTROS PCT: 74 %
Neutro Abs: 6.1 10*3/uL (ref 1.4–6.5)
PLATELETS: 175 10*3/uL (ref 150–440)
RBC: 2.72 MIL/uL — AB (ref 3.80–5.20)
RDW: 19 % — ABNORMAL HIGH (ref 11.5–14.5)
WBC: 8.2 10*3/uL (ref 3.6–11.0)

## 2015-12-15 LAB — URINALYSIS COMPLETE WITH MICROSCOPIC (ARMC ONLY)
Bilirubin Urine: NEGATIVE
Glucose, UA: NEGATIVE mg/dL
Ketones, ur: NEGATIVE mg/dL
Leukocytes, UA: NEGATIVE
Nitrite: NEGATIVE
PH: 5 (ref 5.0–8.0)
PROTEIN: NEGATIVE mg/dL
SPECIFIC GRAVITY, URINE: 1.006 (ref 1.005–1.030)

## 2015-12-15 LAB — PREPARE RBC (CROSSMATCH)

## 2015-12-15 LAB — ABO/RH: ABO/RH(D): A POS

## 2015-12-15 MED ORDER — NITROGLYCERIN 0.4 MG SL SUBL: 0.4000 mg | SUBLINGUAL_TABLET | SUBLINGUAL | Status: DC | PRN

## 2015-12-15 MED ORDER — DEXTROSE-NACL 5-0.9 % IV SOLN
INTRAVENOUS | Status: DC
  Administered 2015-12-15: 13:00:00 via INTRAVENOUS

## 2015-12-15 MED ORDER — LATANOPROST 0.005 % OP SOLN
1.0000 [drp] | Freq: Every day | OPHTHALMIC | Status: DC
  Administered 2015-12-15 – 2015-12-19 (×5): 1 [drp] via OPHTHALMIC
  Filled 2015-12-15: qty 2.5

## 2015-12-15 MED ORDER — SENNOSIDES-DOCUSATE SODIUM 8.6-50 MG PO TABS: 2.0000 | ORAL_TABLET | Freq: Two times a day (BID) | ORAL | Status: DC | PRN

## 2015-12-15 MED ORDER — SODIUM CHLORIDE 0.9% FLUSH: 3.0000 mL | INTRAVENOUS | Status: DC | PRN

## 2015-12-15 MED ORDER — MONTELUKAST SODIUM 10 MG PO TABS
10.0000 mg | ORAL_TABLET | Freq: Every day | ORAL | Status: DC
  Administered 2015-12-15 – 2015-12-19 (×5): 10 mg via ORAL
  Filled 2015-12-15 (×5): qty 1

## 2015-12-15 MED ORDER — GABAPENTIN 300 MG PO CAPS
300.0000 mg | ORAL_CAPSULE | Freq: Two times a day (BID) | ORAL | Status: DC
  Administered 2015-12-15 – 2015-12-20 (×11): 300 mg via ORAL
  Filled 2015-12-15 (×11): qty 1

## 2015-12-15 MED ORDER — ATORVASTATIN CALCIUM 20 MG PO TABS
80.0000 mg | ORAL_TABLET | Freq: Every day | ORAL | Status: DC
  Administered 2015-12-15 – 2015-12-19 (×5): 80 mg via ORAL
  Filled 2015-12-15 (×5): qty 4

## 2015-12-15 MED ORDER — FAMOTIDINE IN NACL 20-0.9 MG/50ML-% IV SOLN
20.0000 mg | Freq: Two times a day (BID) | INTRAVENOUS | Status: DC
Start: 2015-12-15 — End: 2015-12-18
  Administered 2015-12-15 – 2015-12-17 (×6): 20 mg via INTRAVENOUS
  Filled 2015-12-15 (×8): qty 50

## 2015-12-15 MED ORDER — MOMETASONE FURO-FORMOTEROL FUM 200-5 MCG/ACT IN AERO
2.0000 | INHALATION_SPRAY | Freq: Two times a day (BID) | RESPIRATORY_TRACT | Status: DC
  Administered 2015-12-15 – 2015-12-20 (×11): 2 via RESPIRATORY_TRACT
  Filled 2015-12-15: qty 8.8

## 2015-12-15 MED ORDER — ATORVASTATIN CALCIUM 20 MG PO TABS: 80.0000 mg | ORAL_TABLET | Freq: Every day | ORAL | Status: DC

## 2015-12-15 MED ORDER — POLYETHYLENE GLYCOL 3350 17 G PO PACK: 17.0000 g | PACK | Freq: Every day | ORAL | Status: DC | PRN

## 2015-12-15 MED ORDER — VENLAFAXINE HCL 37.5 MG PO TABS
75.0000 mg | ORAL_TABLET | Freq: Two times a day (BID) | ORAL | Status: DC
  Administered 2015-12-15 – 2015-12-20 (×10): 75 mg via ORAL
  Filled 2015-12-15: qty 2
  Filled 2015-12-15: qty 1
  Filled 2015-12-15: qty 2
  Filled 2015-12-15: qty 1
  Filled 2015-12-15 (×2): qty 2
  Filled 2015-12-15: qty 1
  Filled 2015-12-15 (×3): qty 2

## 2015-12-15 MED ORDER — FLUTICASONE PROPIONATE 50 MCG/ACT NA SUSP
1.0000 | Freq: Every day | NASAL | Status: DC | PRN
  Administered 2015-12-15 – 2015-12-16 (×2): 1 via NASAL
  Filled 2015-12-15 (×2): qty 16

## 2015-12-15 MED ORDER — LEVOTHYROXINE SODIUM 137 MCG PO TABS
137.0000 ug | ORAL_TABLET | Freq: Every day | ORAL | Status: DC
  Administered 2015-12-15 – 2015-12-20 (×6): 137 ug via ORAL
  Filled 2015-12-15 (×6): qty 1

## 2015-12-15 MED ORDER — BISACODYL 5 MG PO TBEC
5.0000 mg | DELAYED_RELEASE_TABLET | Freq: Every day | ORAL | Status: DC | PRN
  Administered 2015-12-19: 5 mg via ORAL
  Filled 2015-12-15: qty 1

## 2015-12-15 MED ORDER — ACETAMINOPHEN 325 MG PO TABS
650.0000 mg | ORAL_TABLET | ORAL | Status: DC | PRN
  Administered 2015-12-15 – 2015-12-20 (×9): 650 mg via ORAL
  Filled 2015-12-15 (×9): qty 2

## 2015-12-15 MED ORDER — ALBUTEROL SULFATE HFA 108 (90 BASE) MCG/ACT IN AERS: 1.0000 | INHALATION_SPRAY | RESPIRATORY_TRACT | Status: DC | PRN

## 2015-12-15 MED ORDER — ALBUTEROL SULFATE (2.5 MG/3ML) 0.083% IN NEBU: 2.5000 mg | INHALATION_SOLUTION | Freq: Four times a day (QID) | RESPIRATORY_TRACT | Status: DC | PRN

## 2015-12-15 MED ORDER — DOCUSATE SODIUM 100 MG PO CAPS: 100.0000 mg | ORAL_CAPSULE | Freq: Every day | ORAL | Status: DC | PRN

## 2015-12-15 MED ORDER — ATORVASTATIN CALCIUM 20 MG PO TABS
80.0000 mg | ORAL_TABLET | Freq: Every day | ORAL | Status: DC
  Filled 2015-12-15: qty 4

## 2015-12-15 MED ORDER — INSULIN ASPART 100 UNIT/ML ~~LOC~~ SOLN
0.0000 [IU] | Freq: Three times a day (TID) | SUBCUTANEOUS | Status: DC
  Administered 2015-12-16 (×2): 2 [IU] via SUBCUTANEOUS
  Administered 2015-12-17 (×2): 3 [IU] via SUBCUTANEOUS
  Administered 2015-12-17: 2 [IU] via SUBCUTANEOUS
  Administered 2015-12-18: 1 [IU] via SUBCUTANEOUS
  Administered 2015-12-18: 3 [IU] via SUBCUTANEOUS
  Administered 2015-12-18: 2 [IU] via SUBCUTANEOUS
  Administered 2015-12-19 (×3): 3 [IU] via SUBCUTANEOUS
  Administered 2015-12-20: 5 [IU] via SUBCUTANEOUS
  Administered 2015-12-20: 3 [IU] via SUBCUTANEOUS
  Filled 2015-12-15 (×3): qty 3
  Filled 2015-12-15 (×4): qty 2
  Filled 2015-12-15: qty 1
  Filled 2015-12-15 (×3): qty 3
  Filled 2015-12-15: qty 5
  Filled 2015-12-15: qty 3

## 2015-12-15 MED ORDER — METOPROLOL TARTRATE 25 MG PO TABS
12.5000 mg | ORAL_TABLET | Freq: Two times a day (BID) | ORAL | Status: DC
  Administered 2015-12-15 – 2015-12-20 (×10): 12.5 mg via ORAL
  Filled 2015-12-15 (×11): qty 1

## 2015-12-15 MED ORDER — SODIUM CHLORIDE 0.9 % IV SOLN: 250.0000 mL | INTRAVENOUS | Status: DC | PRN

## 2015-12-15 MED ORDER — INSULIN ASPART 100 UNIT/ML ~~LOC~~ SOLN
0.0000 [IU] | Freq: Every day | SUBCUTANEOUS | Status: DC
  Administered 2015-12-16 – 2015-12-19 (×2): 2 [IU] via SUBCUTANEOUS
  Filled 2015-12-15 (×2): qty 2

## 2015-12-15 MED ORDER — SODIUM CHLORIDE 0.9% FLUSH
3.0000 mL | Freq: Two times a day (BID) | INTRAVENOUS | Status: DC
  Administered 2015-12-15 – 2015-12-20 (×11): 3 mL via INTRAVENOUS

## 2015-12-15 MED ORDER — ONDANSETRON HCL 4 MG/2ML IJ SOLN
4.0000 mg | Freq: Four times a day (QID) | INTRAMUSCULAR | Status: DC | PRN
  Administered 2015-12-17: 4 mg via INTRAVENOUS
  Filled 2015-12-15: qty 2

## 2015-12-15 MED ORDER — LEVOTHYROXINE SODIUM 137 MCG PO TABS
137.0000 ug | ORAL_TABLET | ORAL | Status: DC
  Administered 2015-12-15: 137 ug via ORAL
  Filled 2015-12-15: qty 1

## 2015-12-15 MED ORDER — INSULIN GLARGINE 100 UNIT/ML ~~LOC~~ SOLN
50.0000 [IU] | Freq: Every day | SUBCUTANEOUS | Status: DC
  Administered 2015-12-15 – 2015-12-19 (×5): 50 [IU] via SUBCUTANEOUS
  Filled 2015-12-15 (×6): qty 0.5

## 2015-12-15 MED ORDER — CALCITRIOL 0.25 MCG PO CAPS
0.2500 ug | ORAL_CAPSULE | ORAL | Status: DC
  Administered 2015-12-15 – 2015-12-20 (×3): 0.25 ug via ORAL
  Filled 2015-12-15 (×3): qty 1

## 2015-12-15 MED ORDER — SODIUM CHLORIDE 0.9 % IV SOLN
10.0000 mL/h | Freq: Once | INTRAVENOUS | Status: AC
  Administered 2015-12-15: 10 mL/h via INTRAVENOUS

## 2015-12-15 NOTE — Progress Notes (Signed)
Patient admitted to 2A from home. Complains of generalized weakness. Has had one fall in the last 6 months. Bed alarm activated and patient instructed to call for assistance. Oriented to room and updated on plan of care. Patient is currently NPO pending GI consult.

## 2015-12-15 NOTE — Progress Notes (Signed)
Currently patient has dextrose and pepcid running in her current IV. Two RN's have attempted a second IV site and have been unsuccessful. Patient can only have IV sticks and BP in right arm. CCU RN has been called to attempt a second IV site. Once IV site is in, will start blood.

## 2015-12-15 NOTE — H&P (Addendum)
[facility] NAME: Jill York    MR#:  [id number]  DATE OF BIRTH:  September 24, 1930  DATE OF ADMISSION:  12/15/2015  PRIMARY CARE PHYSICIAN: Sharyne Peach, MD   REQUESTING/REFERRING PHYSICIAN: Earleen Newport, MD  CHIEF COMPLAINT:   Chief Complaint  Patient presents with  . Weakness   Generalized weakness and fall today. HISTORY OF PRESENT ILLNESS:  Jill York  is a 82 y.o. female with a known history of anemia, CHF, COPD, hypertension and diabetes. The patient presently ED with fall today and generalized weakness. She denies any syncope or loss of consciousness. She said she has had intermittent bloody stool for the past one week and generalized weakness. She is eliquis but she didn't take yesterday and today. She denies any chest pain, palpitation but has chronic leg edema. Her troponin is elevated at 0.77. Hemoglobin is 8.4, which is decreased in her baseline. ED physician ordered PRBC transfusion for symptomatic anemia.  PAST MEDICAL HISTORY:   Past Medical History  Diagnosis Date  . CHF (congestive heart failure) ([place])   . Thyroid disease   . Diabetes mellitus without complication ([place])   . Hypertension   . Hyperlipidemia   . Arrhythmia     PVC  . COPD (chronic obstructive pulmonary disease) ([place])   . Chronic kidney disease   . OSA (obstructive sleep apnea) 2011  . Anemia   . Anxiety and depression     [name]  . Lymphoma ([place])   . Osteoarthritis   . Lymphedema     PAST SURGICAL HISTORY:   Past Surgical History  Procedure Laterality Date  . Tonsillectomy and adenoidectomy    . Appendectomy    . Knee surgery Left   . Abdominal hysterectomy    . Cardiac catheterization    . Coronary angioplasty      x4  . Cardiac stents      SOCIAL HISTORY:   Social History  Substance Use Topics  . Smoking status: Former Smoker -- 1.50 packs/day for 18 years    Quit date:  06/25/1975  . Smokeless tobacco: Never Used  . Alcohol Use: No    FAMILY HISTORY:   Family History  Problem Relation Age of Onset  . Aneurysm Mother   . Stroke Mother   . Liver cancer Father     DRUG ALLERGIES:   Allergies  Allergen Reactions  . Amlodipine Swelling    Leg swelling only   . Oxycodone Other (See Comments)    Reaction: patient states she was "out" for 2 hours and did not notice people in the area    . Demerol [Meperidine] Diarrhea and Nausea And Vomiting  . Lisinopril Cough  . Tape Rash    REVIEW OF SYSTEMS:  CONSTITUTIONAL: No fever, Has generalized weakness.  EYES: No blurred or double vision.  EARS, NOSE, AND THROAT: No tinnitus or ear pain.  RESPIRATORY: No cough, shortness of breath, wheezing or hemoptysis.  CARDIOVASCULAR: No chest pain, orthopnea, but has leg edema.  GASTROINTESTINAL: No nausea, vomiting, diarrhea or abdominal pain. Has bloody stool. GENITOURINARY: No dysuria, hematuria.  ENDOCRINE: No polyuria, nocturia,  HEMATOLOGY: No anemia, easy bruising or bleeding SKIN: No rash or lesion. MUSCULOSKELETAL: No joint pain or arthritis.   NEUROLOGIC: No tingling, numbness, weakness.  PSYCHIATRY: No anxiety or depression.   MEDICATIONS AT HOME:   Prior to Admission medications   Medication Sig Start Date End Date  Taking? Authorizing Provider  acetaminophen (TYLENOL) 500 MG tablet Take 1,000 mg by mouth every 6 (six) hours as needed.   Yes Historical Provider, MD  albuterol (PROVENTIL HFA;VENTOLIN HFA) 108 (90 BASE) MCG/ACT inhaler Inhale 1 puff into the lungs every 4 (four) hours as needed for wheezing or shortness of breath.    Yes Historical Provider, MD  albuterol (PROVENTIL) (2.5 MG/3ML) 0.083% nebulizer solution Take 2.5 mg by nebulization every 6 (six) hours as needed for wheezing or shortness of breath.   Yes Historical Provider, MD  apixaban (ELIQUIS) 2.5 MG TABS tablet Take 2.5 mg by mouth 2 (two) times daily.   Yes Historical  Provider, MD  aspirin EC 81 MG tablet Take 81 mg by mouth daily.   Yes Historical Provider, MD  atorvastatin (LIPITOR) 80 MG tablet Take 80 mg by mouth daily.   Yes Historical Provider, MD  baclofen (LIORESAL) 10 MG tablet Take 5 mg by mouth at bedtime as needed for muscle spasms.   Yes Historical Provider, MD  bisacodyl (DULCOLAX) 5 MG EC tablet Take 5 mg by mouth daily as needed for moderate constipation.   Yes Historical Provider, MD  calcitRIOL (ROCALTROL) 0.25 MCG capsule Take 0.25 mcg by mouth every Monday, Wednesday, and Friday.   Yes Historical Provider, MD  diclofenac sodium (VOLTAREN) 1 % GEL Apply 2 g topically 2 (two) times daily.   Yes Historical Provider, MD  docusate sodium (COLACE) 100 MG capsule Take 100 mg by mouth daily as needed for mild constipation or moderate constipation.    Yes Historical Provider, MD  esomeprazole (NEXIUM) 20 MG capsule Take 20 mg by mouth daily as needed (for acid reflux).   Yes Historical Provider, MD  fluticasone (FLONASE) 50 MCG/ACT nasal spray Place 1 spray into both nostrils daily.   Yes Historical Provider, MD  Fluticasone-Salmeterol (ADVAIR) 250-50 MCG/DOSE AEPB Inhale 1 puff into the lungs 2 (two) times daily.   Yes Historical Provider, MD  furosemide (LASIX) 40 MG tablet Take 40 mg by mouth daily.   Yes Historical Provider, MD  gabapentin (NEURONTIN) 300 MG capsule Take 300 mg by mouth 2 (two) times daily.   Yes Historical Provider, MD  insulin glargine (LANTUS) 100 UNIT/ML injection Inject 50 Units into the skin at bedtime.    Yes Historical Provider, MD  insulin lispro (HUMALOG) 100 UNIT/ML injection Inject 20-26 Units into the skin 3 (three) times daily before meals. Inject 20 units sub-q in morning, inject 26 units sub-q in afternoon, and inject 20 units sub-q in evening   Yes Historical Provider, MD  latanoprost (XALATAN) 0.005 % ophthalmic solution Place 1 drop into both eyes at bedtime.   Yes Historical Provider, MD  levothyroxine  (SYNTHROID, LEVOTHROID) 137 MCG tablet Take 137-274 mcg by mouth daily before breakfast. Take 15mcg (1 tablet) daily on Mon, Tues, Wed, Thurs, Sat, and Sun. Take 244mcg (2 tabs) daily on Friday. (Take with a glass of water at least 30 to 60 minutes before breakfast)   Yes Historical Provider, MD  losartan (COZAAR) 25 MG tablet Take 12.5 mg by mouth 2 (two) times daily.   Yes Historical Provider, MD  metoprolol tartrate (LOPRESSOR) 25 MG tablet Take 0.5 tablets (12.5 mg total) by mouth 2 (two) times daily. 01/03/15  Yes Richard Leslye Peer, MD  montelukast (SINGULAIR) 10 MG tablet Take 10 mg by mouth at bedtime.   Yes Historical Provider, MD  nitroGLYCERIN (NITROSTAT) 0.4 MG SL tablet Place 0.4 mg under the tongue every 5 (five) minutes as  needed for chest pain.   Yes Historical Provider, MD  nystatin-triamcinolone (MYCOLOG II) cream Apply 1 application topically 2 (two) times daily.   Yes Historical Provider, MD  polyethylene glycol (MIRALAX / GLYCOLAX) packet Take 17 g by mouth daily as needed.   Yes Historical Provider, MD  senna-docusate (SENOKOT-S) 8.6-50 MG tablet Take 2 tablets by mouth 2 (two) times daily as needed for mild constipation.   Yes Historical Provider, MD  tetrahydrozoline 0.05 % ophthalmic solution Place 1 drop into both eyes daily as needed.   Yes Historical Provider, MD  venlafaxine (EFFEXOR) 75 MG tablet Take 75 mg by mouth 2 (two) times daily.   Yes Historical Provider, MD  amLODipine (NORVASC) 5 MG tablet Take 5 mg by mouth daily.    Historical Provider, MD  Calcium Carbonate-Vitamin D 600-400 MG-UNIT per tablet Take 1 tablet by mouth 2 (two) times daily with a meal. 11/25/14 11/25/15  Historical Provider, MD  Cholecalciferol 1000 UNITS tablet Take 1,000 Units by mouth daily.    Historical Provider, MD      VITAL SIGNS:  Blood pressure 119/73, pulse 92, temperature 97.9 F (36.6 C), temperature source Oral, resp. rate 19, height 5\' 4"  (1.626 m), weight 249 lb (112.946 kg), SpO2 95  %.  PHYSICAL EXAMINATION:  GENERAL:  82 y.o.-year-old patient lying in the bed with no acute distress. Obese. EYES: Pupils equal, round, reactive to light and accommodation. No scleral icterus. Extraocular muscles intact.  HEENT: Head atraumatic, normocephalic. Oropharynx and nasopharynx clear.  NECK:  Supple, no jugular venous distention. No thyroid enlargement, no tenderness.  LUNGS: Normal breath sounds bilaterally, no wheezing, rales,rhonchi or crepitation. No use of accessory muscles of respiration.  CARDIOVASCULAR: S1, S2 normal. No murmurs, rubs, or gallops.  ABDOMEN: Soft, nontender, nondistended. Bowel sounds present. No organomegaly or mass.  EXTREMITIES: Leg edema 2+, no cyanosis, or clubbing.  NEUROLOGIC: Cranial nerves II through XII are intact. Muscle strength 4/5 in all extremities. Sensation intact. Gait not checked.  PSYCHIATRIC: The patient is alert and oriented x 3.  SKIN: No obvious rash, lesion, or ulcer.   LABORATORY PANEL:   CBC  Recent Labs Lab 12/15/15 0828  WBC 8.2  HGB 8.4*  HCT 26.1*  PLT 175   ------------------------------------------------------------------------------------------------------------------  Chemistries   Recent Labs Lab 12/15/15 0828  NA 140  K 4.4  CL 105  CO2 25  GLUCOSE 105*  BUN 49*  CREATININE 2.10*  CALCIUM 8.7*  AST 34  ALT 30  ALKPHOS 77  BILITOT 0.7   ------------------------------------------------------------------------------------------------------------------  Cardiac Enzymes  Recent Labs Lab 12/15/15 0828  TROPONINI 0.77*   ------------------------------------------------------------------------------------------------------------------  RADIOLOGY:  No results found.  EKG:   Orders placed or performed during the hospital encounter of 12/15/15  . ED EKG  . ED EKG    IMPRESSION AND PLAN:   Possible non-STEMI The patient will be admitted to telemetry floor. Follow-up troponin level and a  cardiology consult. Hold eliquis and aspirin due to GI bleeding. Continue Lipitor.  Symptomatic anemia. The patient will get 1 unit PRBC transfusion ordered by ED physician. Follow-up CBC.  GI bleeding. Hold eliquis and ASA. Start Pepcid IV twice a day, keep nothing by mouth with IV fluid support and GI consult.  Acute renal failure on CKD state 4. Gentle rehydration, Hold Lasix and losartan, follow-up BMP.  Diabetes. Start sliding scale, continue Lantus 50 units at bedtime. But we will give half dose if still nothing by mouth.  Chronic diastolic CHF. Stable.  COPD.  Stable. Continue nebulizer when necessary.  All the records are reviewed and case discussed with ED provider. Management plans discussed with the patient, her daughter and they are in agreement.  CODE STATUS: DO NOT RESUSCITATE  TOTAL TIME TAKING CARE OF THIS PATIENT: 58inutes.    Demetrios Loll M.D on 12/15/2015 at 11:34 AM  Between 7am to 6pm - Pager - [phone]  After 6pm go to [email or web address] - password [email or web address] [facility]  Office  [phone]  CC: Primary care physician; Sharyne Peach, MD

## 2015-12-15 NOTE — Progress Notes (Signed)
Patient's CBG up to 123 now that she has eaten dinner. Paged prime MD, Hower, to see if patient still needs D5 NS fluid orders. MD stated to discontinue. Will pass on to night shift to monitor CBG's closely. 1 Unit of blood completed. Patient has had no complaints of pain while at rest, but states she has pain in her legs when she tries to move, but does not want pain medication at this time. Patient is a heavy assist to the bedside commode. Currently wearing 2L of oxygen that she has at home PRN during the day and chronic at night. Vitals are stable. Afib rate controlled on the monitor.

## 2015-12-15 NOTE — Consult Note (Signed)
[facility] Card[facility]  CARDIOLOGY CONSULT NOTE  Patient ID: Jill York MRN: [id number] DOB/AGE: 08/20/1930 82 y.o.  Admit date: 12/15/2015 Referring Physician Bridgett Larsson Primary Physician Gregary Signs Cardiologist Nehemiah Massed Reason for Consultation coronary artery disease, atrial fibrillation, and borderline elevated troponin.  HPI: 82 year old female referred for evaluation of coronary artery disease, chronic atrial fibrillation, with borderline elevated troponin. The patient has known history of coronary artery disease, status post DES proximal and distal RCA 11/10/03. She has a history of chronic atrial fibrillation, on Xarelto for stroke prevention. She presents with one-week history of otherwise weakness and bloody stools. Admission labs were notable for hemoglobin and hematocrit of 8.4 26.1, respectively. Troponin was 0.77. The patient denies chest pain. ECG did not reveal any acute ischemic ST-T wave changes.  Review of systems complete and found to be negative unless listed above     Past Medical History  Diagnosis Date  . CHF (congestive heart failure) ([place])   . Thyroid disease   . Diabetes mellitus without complication ([place])   . Hypertension   . Hyperlipidemia   . Arrhythmia     PVC  . COPD (chronic obstructive pulmonary disease) ([place])   . Chronic kidney disease   . OSA (obstructive sleep apnea) 2011  . Anemia   . Anxiety and depression     [name]  . Lymphoma ([place])   . Osteoarthritis   . Lymphedema     Past Surgical History  Procedure Laterality Date  . Tonsillectomy and adenoidectomy    . Appendectomy    . Knee surgery Left   . Abdominal hysterectomy    . Cardiac catheterization    . Coronary angioplasty      x4  . Cardiac stents      Prescriptions prior to admission  Medication Sig Dispense Refill Last Dose  . acetaminophen (TYLENOL) 500 MG tablet Take 1,000 mg by mouth every 6 (six) hours as needed.   prn at prn  . albuterol (PROVENTIL  HFA;VENTOLIN HFA) 108 (90 BASE) MCG/ACT inhaler Inhale 1 puff into the lungs every 4 (four) hours as needed for wheezing or shortness of breath.    prn at prn  . albuterol (PROVENTIL) (2.5 MG/3ML) 0.083% nebulizer solution Take 2.5 mg by nebulization every 6 (six) hours as needed for wheezing or shortness of breath.   prn at prn  . apixaban (ELIQUIS) 2.5 MG TABS tablet Take 2.5 mg by mouth 2 (two) times daily.   12/14/2015 at 2100  . aspirin EC 81 MG tablet Take 81 mg by mouth daily.   12/14/2015 at 0900  . atorvastatin (LIPITOR) 80 MG tablet Take 80 mg by mouth daily.   12/14/2015 at 0900  . baclofen (LIORESAL) 10 MG tablet Take 5 mg by mouth at bedtime as needed for muscle spasms.   prn at prn  . bisacodyl (DULCOLAX) 5 MG EC tablet Take 5 mg by mouth daily as needed for moderate constipation.   prn at prn  . calcitRIOL (ROCALTROL) 0.25 MCG capsule Take 0.25 mcg by mouth every Monday, Wednesday, and Friday.   12/13/2015 at unknown  . diclofenac sodium (VOLTAREN) 1 % GEL Apply 2 g topically 2 (two) times daily.   unknown at unknown  . docusate sodium (COLACE) 100 MG capsule Take 100 mg by mouth daily as needed for mild constipation or moderate constipation.    prn at prn  . esomeprazole (NEXIUM) 20 MG capsule Take 20 mg by mouth daily as needed (for  acid reflux).   prn at prn  . fluticasone (FLONASE) 50 MCG/ACT nasal spray Place 1 spray into both nostrils daily.   unknown at unknown  . Fluticasone-Salmeterol (ADVAIR) 250-50 MCG/DOSE AEPB Inhale 1 puff into the lungs 2 (two) times daily.   12/14/2015 at Unknown time  . furosemide (LASIX) 40 MG tablet Take 40 mg by mouth daily.   12/14/2015 at Unknown time  . gabapentin (NEURONTIN) 300 MG capsule Take 300 mg by mouth 2 (two) times daily.   12/14/2015 at 2100  . insulin glargine (LANTUS) 100 UNIT/ML injection Inject 50 Units into the skin at bedtime.    12/14/2015 at unknown  . insulin lispro (HUMALOG) 100 UNIT/ML injection Inject 20-26 Units into the skin 3  (three) times daily before meals. Inject 20 units sub-q in morning, inject 26 units sub-q in afternoon, and inject 20 units sub-q in evening   12/14/2015 at unknown  . latanoprost (XALATAN) 0.005 % ophthalmic solution Place 1 drop into both eyes at bedtime.   unknown at unknown  . levothyroxine (SYNTHROID, LEVOTHROID) 137 MCG tablet Take 137-274 mcg by mouth daily before breakfast. Take 186mcg (1 tablet) daily on Mon, Tues, Wed, Thurs, Sat, and Sun. Take 238mcg (2 tabs) daily on Friday. (Take with a glass of water at least 30 to 60 minutes before breakfast)   12/14/2015 at 0800  . losartan (COZAAR) 25 MG tablet Take 12.5 mg by mouth 2 (two) times daily.   12/14/2015 at Unknown time  . metoprolol tartrate (LOPRESSOR) 25 MG tablet Take 0.5 tablets (12.5 mg total) by mouth 2 (two) times daily. 30 tablet 0 12/14/2015 at 2100  . montelukast (SINGULAIR) 10 MG tablet Take 10 mg by mouth at bedtime.   12/14/2015 at Unknown time  . nitroGLYCERIN (NITROSTAT) 0.4 MG SL tablet Place 0.4 mg under the tongue every 5 (five) minutes as needed for chest pain.   prn at prn  . nystatin-triamcinolone (MYCOLOG II) cream Apply 1 application topically 2 (two) times daily.   unknown at unknown  . polyethylene glycol (MIRALAX / GLYCOLAX) packet Take 17 g by mouth daily as needed.   prn at prn  . senna-docusate (SENOKOT-S) 8.6-50 MG tablet Take 2 tablets by mouth 2 (two) times daily as needed for mild constipation.   prn at prn  . tetrahydrozoline 0.05 % ophthalmic solution Place 1 drop into both eyes daily as needed.   prn at prn  . venlafaxine (EFFEXOR) 75 MG tablet Take 75 mg by mouth 2 (two) times daily.   12/14/2015 at Unknown time  . amLODipine (NORVASC) 5 MG tablet Take 5 mg by mouth daily.   01/25/2015 at Unknown time  . Calcium Carbonate-Vitamin D 600-400 MG-UNIT per tablet Take 1 tablet by mouth 2 (two) times daily with a meal.   01/25/2015 at Unknown time  . Cholecalciferol 1000 UNITS tablet Take 1,000 Units by mouth daily.    01/25/2015 at Unknown time   Social History   Social History  . Marital Status: Widowed    Spouse Name: N/A  . Number of Children: N/A  . Years of Education: N/A   Occupational History  . Not on file.   Social History Main Topics  . Smoking status: Former Smoker -- 1.50 packs/day for 18 years    Quit date: 06/25/1975  . Smokeless tobacco: Never Used  . Alcohol Use: No  . Drug Use: No  . Sexual Activity: Not on file   Other Topics Concern  . Not on file  Social History Narrative    Family History  Problem Relation Age of Onset  . Aneurysm Mother   . Stroke Mother   . Liver cancer Father       Review of systems complete and found to be negative unless listed above      PHYSICAL EXAM  General: Well developed, well nourished, in no acute distress HEENT:  Normocephalic and atramatic Neck:  No JVD.  Lungs: Clear bilaterally to auscultation and percussion. Heart: HRRR . Normal S1 and S2 without gallops or murmurs.  Abdomen: Bowel sounds are positive, abdomen soft and non-tender  Msk:  Back normal, normal gait. Normal strength and tone for age. Extremities: No clubbing, cyanosis or edema.   Neuro: Alert and oriented X 3. Psych:  Good affect, responds appropriately  Labs:   Lab Results  Component Value Date   WBC 8.2 12/15/2015   HGB 8.4* 12/15/2015   HCT 26.1* 12/15/2015   MCV 96.0 12/15/2015   PLT 175 12/15/2015    Recent Labs Lab 12/15/15 0828  NA 140  K 4.4  CL 105  CO2 25  BUN 49*  CREATININE 2.10*  CALCIUM 8.7*  PROT 6.8  BILITOT 0.7  ALKPHOS 77  ALT 30  AST 34  GLUCOSE 105*   Lab Results  Component Value Date   CKTOTAL 56 10/20/2014   CKMB 2.3 10/20/2014   TROPONINI 0.77* 12/15/2015    Lab Results  Component Value Date   CHOL 117 01/24/2014   Lab Results  Component Value Date   HDL 59 01/24/2014   Lab Results  Component Value Date   LDLCALC 49 01/24/2014   Lab Results  Component Value Date   TRIG 44 01/24/2014   No  results found for: CHOLHDL No results found for: LDLDIRECT    Radiology: Dg Knee 1-2 Views Right  12/15/2015  CLINICAL DATA:  Fall.  Pain. EXAM: RIGHT KNEE - 1-2 VIEW COMPARISON:  None. FINDINGS: Tricompartment degenerative change. No evidence fracture dislocation. Loose bodies noted. Peripheral vascular calcification . IMPRESSION: 1. Severe tricompartment degenerative change. Degenerative changes most prominent about the medial and patellofemoral compartments. Loose bodies noted. 2. Peripheral vascular disease . Electronically Signed   By: Marcello Moores  Register   On: 12/15/2015 12:35    EKG: Atrial fibrillation  ASSESSMENT AND PLAN:   1. Borderline elevated troponin, and the absence of chest pain or ECG changes, in the setting of anemia and GI bleed, likely due to demand supply ischemia, and not due to acute coronary syndrome 2. CAD, status post prior coronary stent, currently without chest pain 3. Chronic atrial fibrillation, on [place] request, now with anemia and GI bleed 4. Chronic diastolic congestive heart failure  Recommendations  1. Agree with overall current therapy 2. Hold Xarelto 3. Defer further cardiac diagnostics at this time 4. Await GI evaluation  Signed: Renner Sebald MD,PhD, [facility] 12/15/2015, 1:21 PM

## 2015-12-15 NOTE — ED Provider Notes (Signed)
[facility] Emergency Department Provider Note        Time seen: ----------------------------------------- 8:18 AM on 12/15/2015 -----------------------------------------    I have reviewed the triage vital signs and the nursing notes.   HISTORY  Chief Complaint Weakness    HPI Jill York is a 82 y.o. female who presents to ER for a fall that happened this morning. She's had generalized weakness for the past 2 days. She denies hitting her head or losing consciousness. She was noted to have a small skin tear in her right elbow. Patient states she's getting sore in her quads. She feels more weak than normal, she has had some blood in her stool from a bleeding hemorrhoid. She doesn't history of anemia. She denies other complaints.   Past Medical History  Diagnosis Date  . CHF (congestive heart failure)   . Thyroid disease   . Diabetes mellitus without complication   . Hypertension   . Hyperlipidemia   . Arrhythmia     PVC  . COPD (chronic obstructive pulmonary disease)   . Chronic kidney disease   . OSA (obstructive sleep apnea) 2011  . Anemia   . Anxiety and depression     [name]  . Lymphoma   . Osteoarthritis   . Lymphedema     Patient Active Problem List   Diagnosis Date Noted  . Acute respiratory failure with hypoxia ([place]) 01/01/2015  . Chronic diastolic heart failure ([place]) 11/01/2014    Past Surgical History  Procedure Laterality Date  . Tonsillectomy and adenoidectomy    . Appendectomy    . Knee surgery Left   . Abdominal hysterectomy    . Cardiac catheterization    . Coronary angioplasty      x4  . Cardiac stents      Allergies Demerol; Lisinopril; and Tape  Social History Social History  Substance Use Topics  . Smoking status: Former Smoker -- 1.50 packs/day for 18 years    Quit date: 06/25/1975  . Smokeless tobacco: Never Used  . Alcohol Use: No    Review of  Systems Constitutional: Negative for fever. Cardiovascular: Negative for chest pain. Respiratory: Negative for shortness of breath. Gastrointestinal: Negative for abdominal pain, vomiting and diarrhea. Genitourinary: Negative for dysuria. Musculoskeletal: Negative for back pain. Skin: Negative for rash. Neurological: Negative for headaches, Positive for weakness  10-point ROS otherwise negative.  ____________________________________________   PHYSICAL EXAM:  VITAL SIGNS: ED Triage Vitals  Enc Vitals Group     BP --      Pulse --      Resp --      Temp --      Temp src --      SpO2 --      Weight --      Height --      Head Cir --      Peak Flow --      Pain Score --      Pain Loc --      Pain Edu? --      Excl. in [place]? --     Constitutional: Alert and oriented. Well appearing and in no distress. Eyes: Conjunctivae are normal. PERRL. Normal extraocular movements. ENT   Head: Normocephalic and atraumatic.   Nose: No congestion/rhinnorhea.   Mouth/Throat: Mucous membranes are moist.   Neck: No stridor. Cardiovascular: Normal rate, regular rhythm. No murmurs, rubs, or gallops. Respiratory: Normal respiratory effort without tachypnea nor retractions. Breath  sounds are clear and equal bilaterally. No wheezes/rales/rhonchi. Gastrointestinal: Soft and nontender. Normal bowel sounds Musculoskeletal: Nontender with normal range of motion in all extremities. No lower extremity tenderness nor edema. Neurologic:  Normal speech and language. No gross focal neurologic deficits are appreciated.  Skin:  Skin is warm, dry and intact. Small abrasion, right elbow Psychiatric: Mood and affect are normal. Speech and behavior are normal.  ____________________________________________  EKG: Interpreted by me.A 2 fibrillation with a rate of 74 bpm, normal QRS, normal QT interval. Left axis deviation. Septal infarct  ____________________________________________  ED  COURSE:  Pertinent labs & imaging results that were available during my care of the patient were reviewed by me and considered in my medical decision making (see chart for details). Patient resents to ER after mechanical fall. She notes recent weakness which seems unrelated. We'll check basic labs and reevaluate. ____________________________________________    LABS (pertinent positives/negatives)  Labs Reviewed  CBC WITH DIFFERENTIAL/PLATELET - Abnormal; Notable for the following:    RBC 2.72 (*)    Hemoglobin 8.4 (*)    HCT 26.1 (*)    RDW 19.0 (*)    All other components within normal limits  COMPREHENSIVE METABOLIC PANEL - Abnormal; Notable for the following:    Glucose, Bld 105 (*)    BUN 49 (*)    Creatinine, Ser 2.10 (*)    Calcium 8.7 (*)    Albumin 3.3 (*)    GFR calc non Af Amer 21 (*)    GFR calc Af Amer 24 (*)    All other components within normal limits  TROPONIN I - Abnormal; Notable for the following:    Troponin I 0.77 (*)    All other components within normal limits  URINALYSIS COMPLETEWITH MICROSCOPIC ([facility] ONLY) - Abnormal; Notable for the following:    Color, Urine STRAW (*)    APPearance HAZY (*)    Hgb urine dipstick 3+ (*)    Bacteria, UA RARE (*)    Squamous Epithelial / LPF 0-5 (*)    All other components within normal limits   ____________________________________________  FINAL ASSESSMENT AND PLAN  Fall, weakness, Acute on chronic anemia, Elevated troponin  Plan: Patient with labs as dictated above. Patient has describes significant weakness over the last 2 days. She does take Eliquis and has been seeing blood in her stool. Her anemia is progressing as is her kidney failure. She would likely benefit from blood transfusion given her elevated troponin and anemia. We'll recommend observation and serial troponin.   Earleen Newport, MD   Note: This dictation was prepared with Dragon dictation. Any transcriptional errors that result from this  process are unintentional   Earleen Newport, MD 12/15/15 [phone]

## 2015-12-15 NOTE — Progress Notes (Signed)
CBG dropped low to 55 again. Patient still alert and oriented, but feeling a little light headed. Already given graham crackers as soon as Dr. Allen Norris stated okay for patient to eat. Given orange juice after CBG checked and now it is up to 70. Patient has dinner on the way now. Will continue to monitor. Currently D5% with normal saline is not running due to only having one IV access and blood is still infusing.

## 2015-12-15 NOTE — ED Notes (Signed)
Patient brought in by [facility] from home for a fall this morning and generalized weakness for the past 2 days. Patient denies hitting head or LOC. Patient has a small skin tear to the right elbow. Patient denies pain but states that she is getting "sore in her quads".

## 2015-12-15 NOTE — Consult Note (Addendum)
Jill Lame, MD Jill [name], [name] [address] Phone: [phone] Fax : [phone]  Consultation  Referring Provider:     No ref. provider found Primary Care Physician:  Sharyne Peach, MD Primary Gastroenterologist:  [facility]         Reason for Consultation:     anemia  Date of Admission:  12/15/2015 Date of Consultation:  12/15/2015         HPI:   Jill York is a 82 y.o. female who was admitted with anemia. The patient reports that she had generalized weakness and had fallen. The patient has had some intermittent bloody stools with a report of blood in the water and not mixed with the stools. She also had a evaluation with an EGD and colonoscopy back in September 2016 with the findings of " EGD and colonoscopy: 5 colonic polyps removed, 2 clips placed, no active bleeding". It appears that these were done at a outside [place] facility and not at this hospital. The patient reports that she has hemorrhoids. The patient was on Eliquis prior to admission. She was also noted to have a slightly elevated troponin. She has not had any abdominal pain nausea vomiting fevers or chills. He is now receiving a blood transfusion due to a drop in her hemoglobin to 8.4. The patient's previous hemoglobin approximately 11 months ago was 9.2.   Past Medical History  Diagnosis Date  . CHF (congestive heart failure) ([place])   . Thyroid disease   . Diabetes mellitus without complication ([place])   . Hypertension   . Hyperlipidemia   . Arrhythmia     PVC  . COPD (chronic obstructive pulmonary disease) ([place])   . Chronic kidney disease   . OSA (obstructive sleep apnea) 2011  . Anemia   . Anxiety and depression     [name]  . Lymphoma ([place])   . Osteoarthritis   . Lymphedema     Past Surgical History  Procedure Laterality Date  . Tonsillectomy and adenoidectomy    . Appendectomy    . Knee surgery Left   . Abdominal hysterectomy    . Cardiac  catheterization    . Coronary angioplasty      x4  . Cardiac stents      Prior to Admission medications   Medication Sig Start Date End Date Taking? Authorizing Provider  acetaminophen (TYLENOL) 500 MG tablet Take 1,000 mg by mouth every 6 (six) hours as needed.   Yes Historical Provider, MD  albuterol (PROVENTIL HFA;VENTOLIN HFA) 108 (90 BASE) MCG/ACT inhaler Inhale 1 puff into the lungs every 4 (four) hours as needed for wheezing or shortness of breath.    Yes Historical Provider, MD  albuterol (PROVENTIL) (2.5 MG/3ML) 0.083% nebulizer solution Take 2.5 mg by nebulization every 6 (six) hours as needed for wheezing or shortness of breath.   Yes Historical Provider, MD  apixaban (ELIQUIS) 2.5 MG TABS tablet Take 2.5 mg by mouth 2 (two) times daily.   Yes Historical Provider, MD  aspirin EC 81 MG tablet Take 81 mg by mouth daily.   Yes Historical Provider, MD  atorvastatin (LIPITOR) 80 MG tablet Take 80 mg by mouth daily.   Yes Historical Provider, MD  baclofen (LIORESAL) 10 MG tablet Take 5 mg by mouth at bedtime as needed for muscle spasms.   Yes Historical Provider, MD  bisacodyl (DULCOLAX) 5 MG EC tablet Take 5 mg by mouth daily as needed  for moderate constipation.   Yes Historical Provider, MD  calcitRIOL (ROCALTROL) 0.25 MCG capsule Take 0.25 mcg by mouth every Monday, Wednesday, and Friday.   Yes Historical Provider, MD  diclofenac sodium (VOLTAREN) 1 % GEL Apply 2 g topically 2 (two) times daily.   Yes Historical Provider, MD  docusate sodium (COLACE) 100 MG capsule Take 100 mg by mouth daily as needed for mild constipation or moderate constipation.    Yes Historical Provider, MD  esomeprazole (NEXIUM) 20 MG capsule Take 20 mg by mouth daily as needed (for acid reflux).   Yes Historical Provider, MD  fluticasone (FLONASE) 50 MCG/ACT nasal spray Place 1 spray into both nostrils daily.   Yes Historical Provider, MD  Fluticasone-Salmeterol (ADVAIR) 250-50 MCG/DOSE AEPB Inhale 1 puff into the  lungs 2 (two) times daily.   Yes Historical Provider, MD  furosemide (LASIX) 40 MG tablet Take 40 mg by mouth daily.   Yes Historical Provider, MD  gabapentin (NEURONTIN) 300 MG capsule Take 300 mg by mouth 2 (two) times daily.   Yes Historical Provider, MD  insulin glargine (LANTUS) 100 UNIT/ML injection Inject 50 Units into the skin at bedtime.    Yes Historical Provider, MD  insulin lispro (HUMALOG) 100 UNIT/ML injection Inject 20-26 Units into the skin 3 (three) times daily before meals. Inject 20 units sub-q in morning, inject 26 units sub-q in afternoon, and inject 20 units sub-q in evening   Yes Historical Provider, MD  latanoprost (XALATAN) 0.005 % ophthalmic solution Place 1 drop into both eyes at bedtime.   Yes Historical Provider, MD  levothyroxine (SYNTHROID, LEVOTHROID) 137 MCG tablet Take 137-274 mcg by mouth daily before breakfast. Take 156mcg (1 tablet) daily on Mon, Tues, Wed, Thurs, Sat, and Sun. Take 27mcg (2 tabs) daily on Friday. (Take with a glass of water at least 30 to 60 minutes before breakfast)   Yes Historical Provider, MD  losartan (COZAAR) 25 MG tablet Take 12.5 mg by mouth 2 (two) times daily.   Yes Historical Provider, MD  metoprolol tartrate (LOPRESSOR) 25 MG tablet Take 0.5 tablets (12.5 mg total) by mouth 2 (two) times daily. 01/03/15  Yes Richard Leslye Peer, MD  montelukast (SINGULAIR) 10 MG tablet Take 10 mg by mouth at bedtime.   Yes Historical Provider, MD  nitroGLYCERIN (NITROSTAT) 0.4 MG SL tablet Place 0.4 mg under the tongue every 5 (five) minutes as needed for chest pain.   Yes Historical Provider, MD  nystatin-triamcinolone (MYCOLOG II) cream Apply 1 application topically 2 (two) times daily.   Yes Historical Provider, MD  polyethylene glycol (MIRALAX / GLYCOLAX) packet Take 17 g by mouth daily as needed.   Yes Historical Provider, MD  senna-docusate (SENOKOT-S) 8.6-50 MG tablet Take 2 tablets by mouth 2 (two) times daily as needed for mild constipation.   Yes  Historical Provider, MD  tetrahydrozoline 0.05 % ophthalmic solution Place 1 drop into both eyes daily as needed.   Yes Historical Provider, MD  venlafaxine (EFFEXOR) 75 MG tablet Take 75 mg by mouth 2 (two) times daily.   Yes Historical Provider, MD  amLODipine (NORVASC) 5 MG tablet Take 5 mg by mouth daily.    Historical Provider, MD  Calcium Carbonate-Vitamin D 600-400 MG-UNIT per tablet Take 1 tablet by mouth 2 (two) times daily with a meal. 11/25/14 11/25/15  Historical Provider, MD  Cholecalciferol 1000 UNITS tablet Take 1,000 Units by mouth daily.    Historical Provider, MD    Family History  Problem Relation Age of  Onset  . Aneurysm Mother   . Stroke Mother   . Liver cancer Father      Social History  Substance Use Topics  . Smoking status: Former Smoker -- 1.50 packs/day for 18 years    Quit date: 06/25/1975  . Smokeless tobacco: Never Used  . Alcohol Use: No    Allergies as of 12/15/2015 - Review Complete 12/15/2015  Allergen Reaction Noted  . Amlodipine Swelling 12/15/2015  . Oxycodone Other (See Comments) 12/15/2015  . Demerol [meperidine] Diarrhea and Nausea And Vomiting 11/01/2014  . Lisinopril Cough 11/01/2014  . Tape Rash 11/01/2014    Review of Systems:    All systems reviewed and negative except where noted in HPI.   Physical Exam:  Vital signs in last 24 hours: Temp:  [97.9 F (36.6 C)-98.6 F (37 C)] 98.6 F (37 C) (06/23 1515) Pulse Rate:  [73-125] 78 (06/23 1515) Resp:  [16-20] 20 (06/23 1515) BP: (110-141)/(51-122) 116/51 mmHg (06/23 1515) SpO2:  [91 %-100 %] 100 % (06/23 1515) Weight:  [249 lb (112.946 kg)] 249 lb (112.946 kg) (06/23 0824) Last BM Date: 12/14/15 General:   Pleasant, cooperative in NAD Head:  Normocephalic and atraumatic. Eyes:   No icterus.   Conjunctiva pink. PERRLA. Ears:  Normal auditory acuity. Neck:  Supple; no masses or thyroidomegaly Lungs: Respirations even and unlabored. Lungs clear to auscultation bilaterally.   No  wheezes, crackles, or rhonchi.  Heart:  Regular rate and rhythm;  Without murmur, clicks, rubs or gallops Abdomen:  Soft, nondistended, nontender. Normal bowel sounds. No appreciable masses or hepatomegaly.  No rebound or guarding.  Rectal:  Not performed. Msk:  Symmetrical without gross deformities.    Extremities:  Without cyanosis or clubbing. There is pitting edema on lower extremities bilaterally. Neurologic:  Alert and oriented x3;  grossly normal neurologically. Skin:  Intact without significant lesions or rashes. Cervical Nodes:  No significant cervical adenopathy. Psych:  Alert and cooperative. Normal affect.  LAB RESULTS:  Recent Labs  12/15/15 0828  WBC 8.2  HGB 8.4*  HCT 26.1*  PLT 175   BMET  Recent Labs  12/15/15 0828  NA 140  K 4.4  CL 105  CO2 25  GLUCOSE 105*  BUN 49*  CREATININE 2.10*  CALCIUM 8.7*   LFT  Recent Labs  12/15/15 0828  PROT 6.8  ALBUMIN 3.3*  AST 34  ALT 30  ALKPHOS 77  BILITOT 0.7   PT/INR No results for input(s): LABPROT, INR in the last 72 hours.  STUDIES: Dg Knee 1-2 Views Right  12/15/2015  CLINICAL DATA:  Fall.  Pain. EXAM: RIGHT KNEE - 1-2 VIEW COMPARISON:  None. FINDINGS: Tricompartment degenerative change. No evidence fracture dislocation. Loose bodies noted. Peripheral vascular calcification . IMPRESSION: 1. Severe tricompartment degenerative change. Degenerative changes most prominent about the medial and patellofemoral compartments. Loose bodies noted. 2. Peripheral vascular disease . Electronically Signed   By: Marcello Moores  Register   On: 12/15/2015 12:35      Impression / Plan:   KHYLA TROCHE is a 82 y.o. y/o female with rectal bleeding and anemia with a slightly elevated troponin. The patient reports that she has rectal bleeding that the source of was not seen on her colonoscopy within the last year or upper endoscopy. The patient did have polyps at the time of the colonoscopy which were removed and clips were  placed during that procedure without any report of active bleeding. The patient is being transfused at the present time and  has not been prepped for any procedures since admission. There is no GI coverage this weekend and the patient has had a workup within the last year. I do not think repeating the EGD and colonoscopy at this time is necessary and the patient has been explained this and agrees. If the patient has any further bleeding or dramatic drop in her hemoglobin consideration for transfer to [facility] should be considered.   Thank you for involving me in the care of this patient.      LOS: 0 days   Jill Lame, MD  12/15/2015, 3:39 PM   Note: This dictation was prepared with Dragon dictation along with smaller phrase technology. Any transcriptional errors that result from this process are unintentional.

## 2015-12-15 NOTE — Progress Notes (Signed)
3 RN's attempted to get a second IV site and were unsuccessful. Stopping dextrose drip for now and will run blood.

## 2015-12-15 NOTE — ED Notes (Signed)
Patient C/O generalized weakness for the past 2 days. Patient states that she thinks that she has had some blood in her stool, patient believes that her hemorrhoids are bleeding. Patient fell this morning when going to the restroom, she is not currently c/o any injury from the fall. Patient does have small skin tear to right elbow and states that she is sore in her "quads".

## 2015-12-15 NOTE — Progress Notes (Addendum)
Paged Dr. Bridgett Larsson about blood sugar being 8 and MD stated okay for patient to drink orange juice. Patient has drank it now and will recheck CBG. Alert and oriented with no signs of distress. Patient is to stay NPO for now until GI evaluates patient.

## 2015-12-16 ENCOUNTER — Inpatient Hospital Stay: Payer: Medicare Other

## 2015-12-16 LAB — TYPE AND SCREEN
ABO/RH(D): A POS
Antibody Screen: NEGATIVE
Unit division: 0

## 2015-12-16 LAB — GLUCOSE, CAPILLARY
GLUCOSE-CAPILLARY: 122 mg/dL — AB (ref 65–99)
GLUCOSE-CAPILLARY: 162 mg/dL — AB (ref 65–99)
GLUCOSE-CAPILLARY: 224 mg/dL — AB (ref 65–99)
Glucose-Capillary: 170 mg/dL — ABNORMAL HIGH (ref 65–99)

## 2015-12-16 LAB — HEMOGLOBIN: HEMOGLOBIN: 9.1 g/dL — AB (ref 12.0–16.0)

## 2015-12-16 MED ORDER — BACLOFEN 10 MG PO TABS
5.0000 mg | ORAL_TABLET | Freq: Every evening | ORAL | Status: DC | PRN
  Administered 2015-12-16 (×2): 5 mg via ORAL
  Filled 2015-12-16: qty 1
  Filled 2015-12-16 (×3): qty 0.5

## 2015-12-16 MED ORDER — FUROSEMIDE 40 MG PO TABS
40.0000 mg | ORAL_TABLET | Freq: Every day | ORAL | Status: DC
Start: 2015-12-16 — End: 2015-12-20
  Administered 2015-12-16 – 2015-12-20 (×5): 40 mg via ORAL
  Filled 2015-12-16 (×6): qty 1

## 2015-12-16 MED ORDER — SENNA 8.6 MG PO TABS
1.0000 | ORAL_TABLET | Freq: Every day | ORAL | Status: DC
Start: 2015-12-16 — End: 2015-12-20
  Administered 2015-12-16 – 2015-12-20 (×5): 8.6 mg via ORAL
  Filled 2015-12-16 (×5): qty 1

## 2015-12-16 NOTE — Progress Notes (Addendum)
Pt. C/o pleg cramps, Dr. Estanislado Pandy notified and baclofen ordered QHS PRN. Dr. Estanislado Pandy said to give one time dose now. Will continue to monitor pt.

## 2015-12-16 NOTE — Progress Notes (Signed)
Pt. Was awake all night. She had c/o cramping in leg x1, baclofen given with effective results. Pt. Would like to have right shoulder x-rayed R/T fall. She states she had a prior break to the right humerus about 10 years ago. She had no c/o pain to right shoulder. Will continue to monitor pt.

## 2015-12-16 NOTE — Progress Notes (Signed)
[facility] at [place] NAME: Jill York    MR#:  [id number]  DATE OF BIRTH:  [birth year]  SUBJECTIVE:  CHIEF COMPLAINT:   Chief Complaint  Patient presents with  . Weakness  Patient is 82 year old female with past medical history significant for history of multiple medical problems including congestive heart failure, diabetes mellitus, hypertension, hyperlipidemia, arrhythmia, COPD, CK D, who presents to the hospital with generalized weakness and fall, but no loss of consciousness or syncope. She was complaining of intermittent bloody stool for the past one week, while on Eliquis. In emergency room, she was noted to have mildly elevated troponin, which was felt to be demand ischemia, but not acute coronary syndrome per cardiologist. Hemoglobin level was found to be low at 8.4 on admission, the patient was transfused 1 unit of packed red blood cells due to elevated troponin. She is admitted to the hospital for further evaluation. Gastrointestinal consultation was obtained, gastroenterologist. Noted that patient had colonoscopy and upper endoscopy within the past year, no further interventions were recommended by gastroenterologist, but conservative therapy. Cardiology saw patient in consultation and recommended to hold blood thinners, but no further cardiac diagnostics. Patient feels good today, admits of some abdominal discomfort, chronic  Review of Systems  Constitutional: Negative for fever, chills and weight loss.  HENT: Negative for congestion.   Eyes: Negative for blurred vision and double vision.  Respiratory: Negative for cough, sputum production, shortness of breath and wheezing.   Cardiovascular: Negative for chest pain, palpitations, orthopnea, leg swelling and PND.  Gastrointestinal: Positive for abdominal pain. Negative for nausea, vomiting, diarrhea, constipation and blood in stool.  Genitourinary: Negative for dysuria, urgency,  frequency and hematuria.  Musculoskeletal: Negative for falls.  Neurological: Negative for dizziness, tremors, focal weakness and headaches.  Endo/Heme/Allergies: Does not bruise/bleed easily.  Psychiatric/Behavioral: Negative for depression. The patient does not have insomnia.     VITAL SIGNS: Blood pressure 146/83, pulse 70, temperature 97.8 F (36.6 C), temperature source Oral, resp. rate 20, height 5\' 4"  (1.626 m), weight 112.946 kg (249 lb), SpO2 96 %.  PHYSICAL EXAMINATION:   GENERAL:  82 y.o.-year-old patient lying in the bed with no acute distress.  EYES: Pupils equal, round, reactive to light and accommodation. No scleral icterus. Extraocular muscles intact.  HEENT: Head atraumatic, normocephalic. Oropharynx and nasopharynx clear.  NECK:  Supple, no jugular venous distention. No thyroid enlargement, no tenderness.  LUNGS: Normal breath sounds bilaterally, no wheezing, rales,rhonchi or crepitation. No use of accessory muscles of respiration.  CARDIOVASCULAR: S1, S2 normal. No murmurs, rubs, or gallops.  ABDOMEN: Soft, nontender, nondistended. Bowel sounds present. No organomegaly or mass.  EXTREMITIES: No pedal edema, cyanosis, or clubbing.  NEUROLOGIC: Cranial nerves II through XII are intact. Muscle strength 5/5 in all extremities. Sensation intact. Gait not checked.  PSYCHIATRIC: The patient is alert and oriented x 3.  SKIN: No obvious rash, lesion, or ulcer.   ORDERS/RESULTS REVIEWED:   CBC  Recent Labs Lab 12/15/15 0828 12/16/15 0439  WBC 8.2  --   HGB 8.4* 9.1*  HCT 26.1*  --   PLT 175  --   MCV 96.0  --   MCH 31.0  --   MCHC 32.3  --   RDW 19.0*  --   LYMPHSABS 1.5  --   MONOABS 0.4  --   EOSABS 0.1  --   BASOSABS 0.0  --    ------------------------------------------------------------------------------------------------------------------  Chemistries   Recent Labs Lab 12/15/15  [id number] 12/15/15 1225  NA 140  --   K 4.4  --   CL 105  --   CO2 25  --    GLUCOSE 105*  --   BUN 49*  --   CREATININE 2.10*  --   CALCIUM 8.7*  --   MG  --  2.3  AST 34  --   ALT 30  --   ALKPHOS 77  --   BILITOT 0.7  --    ------------------------------------------------------------------------------------------------------------------ estimated creatinine clearance is 25 mL/min (by C-G formula based on Cr of 2.1). ------------------------------------------------------------------------------------------------------------------ No results for input(s): TSH, T4TOTAL, T3FREE, THYROIDAB in the last 72 hours.  Invalid input(s): FREET3  Cardiac Enzymes  Recent Labs Lab 12/15/15 0828 12/15/15 1349 12/15/15 2007  TROPONINI 0.77* 0.65* 0.45*   ------------------------------------------------------------------------------------------------------------------ Invalid input(s): POCBNP ---------------------------------------------------------------------------------------------------------------  RADIOLOGY: Dg Knee 1-2 Views Right  12/15/2015  CLINICAL DATA:  Fall.  Pain. EXAM: RIGHT KNEE - 1-2 VIEW COMPARISON:  None. FINDINGS: Tricompartment degenerative change. No evidence fracture dislocation. Loose bodies noted. Peripheral vascular calcification . IMPRESSION: 1. Severe tricompartment degenerative change. Degenerative changes most prominent about the medial and patellofemoral compartments. Loose bodies noted. 2. Peripheral vascular disease . Electronically Signed   By: Marcello Moores  Register   On: 12/15/2015 12:35    EKG:  Orders placed or performed during the hospital encounter of 12/15/15  . ED EKG  . ED EKG    ASSESSMENT AND PLAN:  Active Problems:   NSTEMI (non-ST elevated myocardial infarction) ([place])   Acute blood loss anemia   Hematochezia #1. Acute on chronic posthemorrhagic symptomatic anemia, status post 1 unit of packed red blood cell transfusion, hemoglobin level is stable at 9.1 today in the morning, following in the morning #2. Elevated  troponin due to demand ischemia, no further diagnostics per cardiologist. Continue Lipitor and metoprolol, not on aspirin due to GI bleed #3. Gastrointestinal bleed due to unclear etiology at present, status post EGD and colonoscopy within within 1 past, which showed only polyps in colon, removed with clip placement , no further diagnostics were recommended by gastroenterologist as long as bleeding subsides, holding anticoagulation with Eliquis.  following patient closely and getting gastroenterologist back if recurrent bleeding while off anticoagulation .  #4. Acute on chronic renal failure, now on IV fluids and status post transfusion, follow creatinine in the morning.   Management plans discussed with the patient, family and they are in agreement.   DRUG ALLERGIES:  Allergies  Allergen Reactions  . Amlodipine Swelling    Leg swelling only   . Oxycodone Other (See Comments)    Reaction: patient states she was "out" for 2 hours and did not notice people in the area    . Demerol [Meperidine] Diarrhea and Nausea And Vomiting  . Lisinopril Cough  . Tape Rash    CODE STATUS:     Code Status Orders        Start     Ordered   12/15/15 1200  Do not attempt resuscitation (DNR)   Continuous    Question Answer Comment  In the event of cardiac or respiratory ARREST Do not call a "code blue"   In the event of cardiac or respiratory ARREST Do not perform Intubation, CPR, defibrillation or ACLS   In the event of cardiac or respiratory ARREST Use medication by any route, position, wound care, and other measures to relive pain and suffering. May use oxygen, suction and manual treatment of airway obstruction as needed for comfort.  12/15/15 1159    Code Status History    Date Active Date Inactive Code Status Order ID Comments User Context   01/01/2015 10:29 AM 01/04/2015  1:43 PM DNR [id number]  Loletha Grayer, MD ED    Advance Directive Documentation        Most Recent Value   Type of  Advance Directive  Healthcare Power of Attorney   Pre-existing out of facility DNR order (yellow form or pink MOST form)     "MOST" Form in Place?        TOTAL TIME TAKING CARE OF THIS PATIENT: 40 minutes.    Theodoro Grist M.D on 12/16/2015 at 3:29 PM  Between 7am to 6pm - Pager - [phone]  After 6pm go to [email or web address] - password [email or web address] [facility]  Office  [phone]  CC: Primary care physician; Sharyne Peach, MD

## 2015-12-16 NOTE — Progress Notes (Signed)
Patient has been alert and oriented, but keeps stating she is seeing things in her room. Describes a little girl with red hair. Patient states "I know I'm going crazy". Complained of generalized soreness once, given tylenol and patient went to sleep after. Shoulder xray negative for anything acute today.

## 2015-12-16 NOTE — Progress Notes (Signed)
[facility] Card[facility]  SUBJECTIVE: I don't have chest pain   Filed Vitals:   12/15/15 1827 12/15/15 1953 12/16/15 0454 12/16/15 0810  BP: 114/64 116/63 106/80 154/43  Pulse: 84 84  84  Temp: 97.9 F (36.6 C) 97.8 F (36.6 C) 97.9 F (36.6 C)   TempSrc: Oral Oral Oral   Resp: 22 16 16 20   Height:      Weight:      SpO2: 92% 99%  93%     Intake/Output Summary (Last 24 hours) at 12/16/15 0915 Last data filed at 12/16/15 0827  Gross per 24 hour  Intake   1083 ml  Output   1350 ml  Net   -267 ml      PHYSICAL EXAM  General: Well developed, well nourished, in no acute distress HEENT:  Normocephalic and atramatic Neck:  No JVD.  Lungs: Clear bilaterally to auscultation and percussion. Heart: HRRR . Normal S1 and S2 without gallops or murmurs.  Abdomen: Bowel sounds are positive, abdomen soft and non-tender  Msk:  Back normal, normal gait. Normal strength and tone for age. Extremities: No clubbing, cyanosis or edema.   Neuro: Alert and oriented X 3. Psych:  Good affect, responds appropriately   LABS: Basic Metabolic Panel:  Recent Labs  12/15/15 0828 12/15/15 1225  NA 140  --   K 4.4  --   CL 105  --   CO2 25  --   GLUCOSE 105*  --   BUN 49*  --   CREATININE 2.10*  --   CALCIUM 8.7*  --   MG  --  2.3   Liver Function Tests:  Recent Labs  12/15/15 0828  AST 34  ALT 30  ALKPHOS 77  BILITOT 0.7  PROT 6.8  ALBUMIN 3.3*   No results for input(s): LIPASE, AMYLASE in the last 72 hours. CBC:  Recent Labs  12/15/15 0828 12/16/15 0439  WBC 8.2  --   NEUTROABS 6.1  --   HGB 8.4* 9.1*  HCT 26.1*  --   MCV 96.0  --   PLT 175  --    Cardiac Enzymes:  Recent Labs  12/15/15 0828 12/15/15 1349 12/15/15 2007  TROPONINI 0.77* 0.65* 0.45*   BNP: Invalid input(s): POCBNP D-Dimer: No results for input(s): DDIMER in the last 72 hours. Hemoglobin A1C: No results for input(s): HGBA1C in the last 72 hours. Fasting Lipid Panel: No results for input(s):  CHOL, HDL, LDLCALC, TRIG, CHOLHDL, LDLDIRECT in the last 72 hours. Thyroid Function Tests: No results for input(s): TSH, T4TOTAL, T3FREE, THYROIDAB in the last 72 hours.  Invalid input(s): FREET3 Anemia Panel: No results for input(s): VITAMINB12, FOLATE, FERRITIN, TIBC, IRON, RETICCTPCT in the last 72 hours.  Dg Knee 1-2 Views Right  12/15/2015  CLINICAL DATA:  Fall.  Pain. EXAM: RIGHT KNEE - 1-2 VIEW COMPARISON:  None. FINDINGS: Tricompartment degenerative change. No evidence fracture dislocation. Loose bodies noted. Peripheral vascular calcification . IMPRESSION: 1. Severe tricompartment degenerative change. Degenerative changes most prominent about the medial and patellofemoral compartments. Loose bodies noted. 2. Peripheral vascular disease . Electronically Signed   By: Marcello Moores  Register   On: 12/15/2015 12:35     Echo mildly reduced left ventricular function with LVEF 45-50%  TELEMETRY: Atrial fibrillation with controlled ventricular rate:  ASSESSMENT AND PLAN:  Active Problems:   NSTEMI (non-ST elevated myocardial infarction) ([place])   Acute blood loss anemia   Hematochezia    1. Borderline elevated troponin, and the absence of chest pain  or ECG changes, in the setting of anemia and GI bleed, likely due to demand supply ischemia, and not due to acute coronary syndrome 2. Chronic atrial fibrillation, rate controlled 3. CAD, status post coronary stent, no chest pain 4. Anemia, GI bleed, EGD and colonoscopy pending  Recommendations  1. Agree with current therapy 2. Continue to hold Eliquis, resume after completion of GI workup 3. Defer further cardiac diagnostics at this time  Signed off for now, please call if any questions   Skyann Ganim, MD, PhD, [facility] 12/16/2015 9:15 AM

## 2015-12-17 LAB — BLOOD GAS, ARTERIAL
ACID-BASE EXCESS: 6.2 mmol/L — AB (ref 0.0–3.0)
Allens test (pass/fail): POSITIVE — AB
BICARBONATE: 33 meq/L — AB (ref 21.0–28.0)
FIO2: 0.28
O2 Saturation: 93.5 %
PATIENT TEMPERATURE: 37
PH ART: 7.37 (ref 7.350–7.450)
pCO2 arterial: 57 mmHg — ABNORMAL HIGH (ref 32.0–48.0)
pO2, Arterial: 71 mmHg — ABNORMAL LOW (ref 83.0–108.0)

## 2015-12-17 LAB — BASIC METABOLIC PANEL
Anion gap: 8 (ref 5–15)
BUN: 48 mg/dL — AB (ref 6–20)
CALCIUM: 8.6 mg/dL — AB (ref 8.9–10.3)
CO2: 28 mmol/L (ref 22–32)
Chloride: 106 mmol/L (ref 101–111)
Creatinine, Ser: 1.7 mg/dL — ABNORMAL HIGH (ref 0.44–1.00)
GFR calc Af Amer: 31 mL/min — ABNORMAL LOW (ref >60)
GFR, EST NON AFRICAN AMERICAN: 27 mL/min — AB (ref >60)
Glucose, Bld: 253 mg/dL — ABNORMAL HIGH (ref 65–99)
POTASSIUM: 4.1 mmol/L (ref 3.5–5.1)
SODIUM: 142 mmol/L (ref 135–145)

## 2015-12-17 LAB — GLUCOSE, CAPILLARY
GLUCOSE-CAPILLARY: 225 mg/dL — AB (ref 65–99)
GLUCOSE-CAPILLARY: 209 mg/dL — AB (ref 65–99)
Glucose-Capillary: 182 mg/dL — ABNORMAL HIGH (ref 65–99)
Glucose-Capillary: 156 mg/dL — ABNORMAL HIGH (ref 65–99)

## 2015-12-17 LAB — HEMOGLOBIN: HEMOGLOBIN: 8.8 g/dL — AB (ref 12.0–16.0)

## 2015-12-17 NOTE — Progress Notes (Addendum)
Arrived to pt's room, pt on floor, per CNA [facility], Pt's bed alarm activated, when entering room pt. Was getting up to edge of bed stating she needed to use bedside commode. Bedside commode was placed bedside. As pt. Was being transferred to [facility] she became weak and was assisted to the floor. Pt. Assisted back to bed. Vital signs taken. Pt. Assessed for injuries no new discolorations, shorting or deformities of limbs. Pt. Is able to move all limbs. Pt. Did  Not head. No c/o pain. MD notified, no new orders. Daughter notified. Will continue to monitor pt. Pt. Moved to room 252 and fall mats placed for protection.

## 2015-12-17 NOTE — Progress Notes (Signed)
PT Cancellation Note  Patient Details Name: Jill York MRN: [id number] DOB: [birth year]   Cancelled Treatment:    Reason Eval/Treat Not Completed: Patient's level of consciousness Attempted to see pt this AM and PM. On both occasions she was very confused and lethargic and could not really stay awake enough to participate and when she did try to say her name or answer questions she would doze off or mumble incoherently.  Will try back when pt is more appropriate.   Kreg Shropshire 12/17/2015, 4:35 PM

## 2015-12-17 NOTE — BH Assessment (Signed)
Writer informed psychiatrist (Dr. Pucilowska) of consult. 

## 2015-12-17 NOTE — Progress Notes (Addendum)
[place] at [place] NAME: Jill York    MR#:  [id number]  DATE OF BIRTH:  12/02/1930  SUBJECTIVE:  CHIEF COMPLAINT:   Chief Complaint  Patient presents with  . Weakness  Patient is 82 year old female with past medical history significant for history of multiple medical problems including congestive heart failure, diabetes mellitus, hypertension, hyperlipidemia, arrhythmia, COPD, CK D, who presents to the hospital with generalized weakness and fall, but no loss of consciousness or syncope. She was complaining of intermittent bloody stool for the past one week, while on Eliquis. In emergency room, she was noted to have mildly elevated troponin, which was felt to be demand ischemia, but not acute coronary syndrome per cardiologist. Hemoglobin level was found to be low at 8.4 on admission, the patient was transfused 1 unit of packed red blood cells due to elevated troponin. She is admitted to the hospital for further evaluation. Gastrointestinal consultation was obtained, gastroenterologist. Noted that patient had colonoscopy and upper endoscopy within the past year, no further interventions were recommended by gastroenterologist, but conservative therapy. Cardiology saw patient in consultation and recommended to hold blood thinners, but no further cardiac diagnostics. Patient Is asleep now, unable to get review of systems, per nursing staff, she is more confused. Patient had fall earlier today with no injury, per nursing staff. Patient complained of right shoulder pain, x-ray done yesterday showed no fracture or subluxation.   Review of Systems  Unable to perform ROS: other  asleep  VITAL SIGNS: Blood pressure 153/64, pulse 80, temperature 98.1 F (36.7 C), temperature source Oral, resp. rate 21, height 5\' 4"  (1.626 m), weight 112.946 kg (249 lb), SpO2 97 %.  PHYSICAL EXAMINATION:   GENERAL:  82 y.o.-year-old patient lying in the bed with no  acute distress.  EYES: Pupils equal, round, reactive to light and accommodation. No scleral icterus. Extraocular muscles intact.  HEENT: Head atraumatic, normocephalic. Oropharynx and nasopharynx clear.  NECK:  Supple, no jugular venous distention. No thyroid enlargement, no tenderness.  LUNGS: Normal breath sounds bilaterally, no wheezing, rales,rhonchi or crepitation. No use of accessory muscles of respiration.  CARDIOVASCULAR: S1, S2 normal. No murmurs, rubs, or gallops.  ABDOMEN: Soft, nontender, nondistended. Bowel sounds present. No organomegaly or mass.  EXTREMITIES: 1-2+ lower extremity and pedal edema, no cyanosis, or clubbing.  NEUROLOGIC: Cranial nerves II through XII are grossly intact with limited evaluation due to sleepiness. Muscle strength unable to evaluate. Sensation unable to evaluate. Gait not checked.  PSYCHIATRIC: The patient is sleeping, not responding to painful stimuli  SKIN: No obvious rash, lesion, or ulcer.   ORDERS/RESULTS REVIEWED:   CBC  Recent Labs Lab 12/15/15 0828 12/16/15 0439 12/17/15 0535  WBC 8.2  --   --   HGB 8.4* 9.1* 8.8*  HCT 26.1*  --   --   PLT 175  --   --   MCV 96.0  --   --   MCH 31.0  --   --   MCHC 32.3  --   --   RDW 19.0*  --   --   LYMPHSABS 1.5  --   --   MONOABS 0.4  --   --   EOSABS 0.1  --   --   BASOSABS 0.0  --   --    ------------------------------------------------------------------------------------------------------------------  Chemistries   Recent Labs Lab 12/15/15 0828 12/15/15 1225 12/17/15 0535  NA 140  --  142  K 4.4  --  4.1  CL 105  --  106  CO2 25  --  28  GLUCOSE 105*  --  253*  BUN 49*  --  48*  CREATININE 2.10*  --  1.70*  CALCIUM 8.7*  --  8.6*  MG  --  2.3  --   AST 34  --   --   ALT 30  --   --   ALKPHOS 77  --   --   BILITOT 0.7  --   --    ------------------------------------------------------------------------------------------------------------------ estimated creatinine  clearance is 30.9 mL/min (by C-G formula based on Cr of 1.7). ------------------------------------------------------------------------------------------------------------------ No results for input(s): TSH, T4TOTAL, T3FREE, THYROIDAB in the last 72 hours.  Invalid input(s): FREET3  Cardiac Enzymes  Recent Labs Lab 12/15/15 0828 12/15/15 1349 12/15/15 2007  TROPONINI 0.77* 0.65* 0.45*   ------------------------------------------------------------------------------------------------------------------ Invalid input(s): POCBNP ---------------------------------------------------------------------------------------------------------------  RADIOLOGY: Dg Shoulder Right  12/16/2015  CLINICAL DATA:  Fall, right shoulder pain EXAM: RIGHT SHOULDER - 2+ VIEW COMPARISON:  10/20/2014 FINDINGS: Three views of the right shoulder submitted. There is old fracture deformity of the right humeral head without significant change from prior exam. Extensive degenerative changes AC joint again noted. Stable degenerative changes glenohumeral joint. Stable mild inferior spurring of glenoid. No acute fracture or subluxation. IMPRESSION: No acute fracture or or subluxation. Stable old fracture deformity of the right humeral head. Extensive osteoarthritic changes as described above. Diffuse osteopenia. Electronically Signed   By: Lahoma Crocker M.D.   On: 12/16/2015 16:21    EKG:  Orders placed or performed during the hospital encounter of 12/15/15  . ED EKG  . ED EKG    ASSESSMENT AND PLAN:  Active Problems:   NSTEMI (non-ST elevated myocardial infarction) ([place])   Acute blood loss anemia   Hematochezia #1. Acute on chronic posthemorrhagic symptomatic anemia, status post 1 unit of packed red blood cell transfusion, hemoglobin level is stable at 8.8  today , the patient is on a regular diet #2. Elevated troponin due to demand ischemia, no further diagnostics per cardiologist. Continue Lipitor and metoprolol, not on  aspirin due to GI bleed #3. Gastrointestinal bleed due to unclear etiology at present, status post EGD and colonoscopy within within 1 year, which showed only polyps in colon, removed, s/p  clip placement , no further diagnostics were recommended by gastroenterologist as long as bleeding subsides, holding anticoagulation with Eliquis.  Following patient closely and getting gastroenterologist back if recurrent bleeding while off anticoagulation . Continue soft diet #4. Acute on chronic renal failure, status post transfusion, creatinine has improved, which is her baseline at 1.5-1.7. #5 . Confusion, likely delirious, etiology remains unclear, patient's family endorse hallucinations at home for the past few months, getting psychiatrist involved for further recommendations  #6. Right shoulder pain, no injury on x-ray #7 fall, generalized weakness, getting physical therapist involved for further recommendations #8. History of obstructive sleep apnea, rule out CO2 retention, get ABGs   Management plans discussed with the patient, family and they are in agreement.   DRUG ALLERGIES:  Allergies  Allergen Reactions  . Amlodipine Swelling    Leg swelling only   . Oxycodone Other (See Comments)    Reaction: patient states she was "out" for 2 hours and did not notice people in the area    . Demerol [Meperidine] Diarrhea and Nausea And Vomiting  . Lisinopril Cough  . Tape Rash    CODE STATUS:     Code Status Orders        Start  Ordered   12/15/15 1200  Do not attempt resuscitation (DNR)   Continuous    Question Answer Comment  In the event of cardiac or respiratory ARREST Do not call a "code blue"   In the event of cardiac or respiratory ARREST Do not perform Intubation, CPR, defibrillation or ACLS   In the event of cardiac or respiratory ARREST Use medication by any route, position, wound care, and other measures to relive pain and suffering. May use oxygen, suction and manual treatment  of airway obstruction as needed for comfort.      12/15/15 1159    Code Status History    Date Active Date Inactive Code Status Order ID Comments User Context   01/01/2015 10:29 AM 01/04/2015  1:43 PM DNR [id number]  Loletha Grayer, MD ED    Advance Directive Documentation        Most Recent Value   Type of Advance Directive  Healthcare Power of Attorney   Pre-existing out of facility DNR order (yellow form or pink MOST form)     "MOST" Form in Place?        TOTAL TIME TAKING CARE OF THIS PATIENT: 40 minutes.  Discussed with patient's daughter [name], all questions were answered  Jill York M.D on 12/17/2015 at 1:48 PM  Between 7am to 6pm - Pager - [phone]  After 6pm go to [email or web address] - password [email or web address] [facility]  Office  [phone]  CC: Primary care physician; Sharyne Peach, MD

## 2015-12-17 NOTE — Progress Notes (Signed)
Patient had an assisted fall this morning around 5:30. Patient is much more confused from yesterday, but then has periods where she answers questions appropriately. Only truly oriented to herself. Night shift has assessed for injuries and found no new sites. Patient complaining of slight ankle pain in her left ankle this morning, but she also complained of generalized discomfort in her legs yesterday. When asked again about the ankle pain, patient didn't recall saying it hurt earlier. Vitals are stable. Fall mat is beside patient's bed and patient has been moved closer to the nurses station. Bed alarm on the middle sensitivity setting. Will continue to assess.

## 2015-12-17 NOTE — Progress Notes (Signed)
Patient has been disoriented all day, but has improved a little around 1700 for dinner. Still disoriented to time and situation, but now knows she is in the hospital. Daughter has been at the bedside for the past couple of hours. Paged Dr. Ether Griffins to talk to her and MD updated daughter. Daughter states patient has been having some hallucinations for the past month at home, but the confusion she has now is new. Patient needs assistance eating and drinking due to how drowsy she becomes quickly. Patient throws her arms frequently when she tries to hold anything. Has been incontinent today. Patient had one episode of nausea today that was relieved by zofran. Post fall vitals are stable. Afib on monitor baseline rate controlled.

## 2015-12-18 DIAGNOSIS — R441 Visual hallucinations: Secondary | ICD-10-CM

## 2015-12-18 DIAGNOSIS — H5316 Psychophysical visual disturbances: Secondary | ICD-10-CM

## 2015-12-18 LAB — GLUCOSE, CAPILLARY
GLUCOSE-CAPILLARY: 207 mg/dL — AB (ref 65–99)
GLUCOSE-CAPILLARY: 125 mg/dL — AB (ref 65–99)
GLUCOSE-CAPILLARY: 165 mg/dL — AB (ref 65–99)
Glucose-Capillary: 196 mg/dL — ABNORMAL HIGH (ref 65–99)

## 2015-12-18 LAB — HEMOGLOBIN: Hemoglobin: 8.5 g/dL — ABNORMAL LOW (ref 12.0–16.0)

## 2015-12-18 LAB — AMMONIA: AMMONIA: 20 umol/L (ref 9–35)

## 2015-12-18 MED ORDER — SODIUM CHLORIDE 0.9 % IV SOLN
10.0000 mg | Freq: Two times a day (BID) | INTRAVENOUS | Status: DC
  Administered 2015-12-18 – 2015-12-20 (×4): 10 mg via INTRAVENOUS
  Filled 2015-12-18 (×6): qty 1

## 2015-12-18 NOTE — Evaluation (Addendum)
Physical Therapy Evaluation Patient Details Name: Jill York MRN: [id number] DOB: 12/25/1930 Today's Date: 12/18/2015   History of Present Illness  82 yo F presented to ED on 6/23 after a fall and increased weakness. She was admitted for possibly having had an NSTEMI. PMH includes coronary angioplasty x4, COPD, CKD, CHF, and DM. Pt has also been having memory problems and hallucinations for the past month.  Clinical Impression  Pt demonstrated generalized weakness and difficulty walking during evaluation. She was previously living alone and independent with FWW. Pt now requires mod A for bed mobility. Transfers and limited ambulation of 2 ft with FWW require mod A +2 for safety. She is limited by fatigue and L ankle pain in standing. STR recommended after hospital discharge to address deficits of strength, endurance, gait, transfers and balance to progress towards PLOF. Pt would be unsafe to return home at this time. Pt will benefit from skilled PT services to increase functional I and mobility for safe discharge.     Follow Up Recommendations SNF    Equipment Recommendations  None recommended by PT (pt has recommended FWW)    Recommendations for Other Services       Precautions / Restrictions Precautions Precautions: Fall Restrictions Weight Bearing Restrictions: No      Mobility  Bed Mobility Overal bed mobility: Needs Assistance Bed Mobility: Supine to Sit     Supine to sit: Mod assist;HOB elevated     General bed mobility comments: difficulty getting trunk upright  Transfers Overall transfer level: Needs assistance Equipment used: Rolling walker (2 wheeled) Transfers: Sit to/from [facility] Sit to Stand: Mod assist;+2 safety/equipment Stand pivot transfers: Mod assist;+2 safety/equipment       General transfer comment: cues for hand placement, anterior weight shifting and keeping FWW on the floor, difficulty taking steps, c/o L ankle  pain  Ambulation/Gait Ambulation/Gait assistance: Mod assist;+2 physical assistance Ambulation Distance (Feet): 2 Feet Assistive device: Rolling walker (2 wheeled) Gait Pattern/deviations: Step-to pattern;Narrow base of support Gait velocity: reduced   General Gait Details: Very small steps. Difficulty due to L ankle pain and decreased balance/strength.  Stairs            Wheelchair Mobility    Modified Rankin (Stroke Patients Only)       Balance Overall balance assessment: Needs assistance;History of Falls Sitting-balance support: Bilateral upper extremity supported;Feet supported Sitting balance-Leahy Scale: Fair Sitting balance - Comments: maintains static balance   Standing balance support: Bilateral upper extremity supported Standing balance-Leahy Scale: Poor Standing balance comment: requires min to mod A to maintain standing balance                             Pertinent Vitals/Pain Pain Assessment: No/denies pain (none at rest, L ankle pain in standing)    Home Living Family/patient expects to be discharged to:: Private residence Living Arrangements: Alone Available Help at Discharge: Family Type of Home: House Home Access: Stairs to enter Entrance Stairs-Rails: Can reach both Entrance Stairs-Number of Steps: 3 Home Layout: One level Home Equipment: Environmental consultant - 2 wheels Additional Comments: medical alert system    Prior Function Level of Independence: Independent with assistive device(s)         Comments: Limited ambulation with FWW     Hand Dominance        Extremity/Trunk Assessment   Upper Extremity Assessment: Generalized weakness           Lower Extremity  Assessment: Generalized weakness (grossly 4-/5)         Communication   Communication: No difficulties  Cognition Arousal/Alertness: Awake/alert Behavior During Therapy: WFL for tasks assessed/performed Overall Cognitive Status: Within Functional Limits for tasks  assessed                      General Comments General comments (skin integrity, edema, etc.): c/o L ankle pain, RN aware    Exercises Other Exercises Other Exercises: B LE seated therex: ankle pumps, LAQs, marching, hip add squeezes x10 each. Cues for staying on task and technique.      Assessment/Plan    PT Assessment Patient needs continued PT services  PT Diagnosis Difficulty walking;Generalized weakness   PT Problem List Decreased strength;Decreased activity tolerance;Decreased balance;Decreased mobility;Decreased knowledge of use of DME;Decreased safety awareness;Obesity;Pain  PT Treatment Interventions DME instruction;Gait training;Stair training;Therapeutic activities;Therapeutic exercise;Balance training;Neuromuscular re-education;Patient/family education   PT Goals (Current goals can be found in the Care Plan section) Acute Rehab PT Goals Patient Stated Goal: to get stronger PT Goal Formulation: With patient Time For Goal Achievement: 01/01/16 Potential to Achieve Goals: Fair    Frequency Min 2X/week   Barriers to discharge Inaccessible home environment;Decreased caregiver support steps to enter, lives alone    Co-evaluation               End of Session Equipment Utilized During Treatment: Gait belt;Oxygen Activity Tolerance: Patient limited by fatigue;Patient limited by pain Patient left: in chair;with call bell/phone within reach;with chair alarm set Nurse Communication: Mobility status         Time: [id number] PT Time Calculation (min) (ACUTE ONLY): 29 min   Charges:   PT Evaluation $PT Eval Moderate Complexity: 1 Procedure PT Treatments $Therapeutic Exercise: 8-22 mins   PT G Codes:        Neoma Laming, PT, DPT  12/18/2015, 3:25 PM [phone]

## 2015-12-18 NOTE — Consult Note (Signed)
[facility] Face-to-Face Psychiatry Consult   Reason for Consult:  Consult for this 82 year old woman without any past significant psychiatric problems. Concern about visual hallucinations. Referring Physician:  Ether Griffins Patient Identification: Jill York MRN:  [id number] Principal Diagnosis: Episodes of formed visual hallucinations Diagnosis:   Patient Active Problem List   Diagnosis Date Noted  . Episodes of formed visual hallucinations [H53.16] 12/18/2015  . NSTEMI (non-ST elevated myocardial infarction) ([place]) [I21.4] 12/15/2015  . Acute blood loss anemia [D62]   . Hematochezia [K92.1]   . Acute respiratory failure with hypoxia ([place]) [J96.01] 01/01/2015  . Chronic diastolic heart failure (HCC) [I50.32] 11/01/2014    Total Time spent with patient: 1 hour  Subjective:   Jill York is a 82 y.o. female patient admitted with "I see a chubby little African-American woman".  HPI:  Patient interviewed. Chart reviewed. Labs and vitals reviewed. Consult because of persistent hallucinations. Patient states that her hallucinations primarily happen at night. She doesn't have an awareness that they are happening during the daytime. She says the most common thing she will see is a chubby little African-American woman sitting crosslegged at the end of her bed. This is actually somebody the patient knew from years ago. Patient says it usually happens at night and that it does not make her emotionally distressed. She says it actually makes her feel sort of happy as this was someone who was a friend of hers. She also says she will sometimes see visions of her mother. Her description of this is a HEARSE going by. She still says that it does not make her emotionally upset. Patient does not have any complaints of psychiatric symptoms in the daytime. She denies feeling depressed although she says that it has been hard adjusting to her husband passing away 3 years ago. She still has positive things that she  enjoys during the day. She admits she has some difficulty sleeping much of which is due to her medical problems. The need to go to the bathroom wakes her up at night as does her joint pain. Patient absolutely denies any suicidal thoughts or wish to die. She does not show evidence of any paranoid delusions that I can identify. Patient denies any use of alcohol or drugs. Obviously the idea has arisen among the patient and her family that these symptoms could be caused by narcotics. I agreed with her that narcotics were a common cause of visual hallucinations but that it doesn't look like she is actually on any narcotics right now and it's not clear to me whether she was before coming into the hospital.  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 Living independently. Daughter lives nearby. Good relationship with her adult children and family. Reports that she still has friends living that she sees regularly. Morning her husband from 3 years ago but seems to actually have a pretty good quality of life from her description.  Medical history: Status post knee replacement on the left side. Now with painful arthritis on the right side which is evidently judged to be nonoperable. History of diastolic heart failure respiratory failure history of a myocardial infarction. Overweight.  Substance abuse history: Patient denies that she's ever had any sort of alcohol problem. She describes herself as "allergic" to alcohol because she had a bad reaction to it the first time she tried any when she was in her 2s. Never had any kind of drug problem.  Past Psychiatric History: She says that many years ago probably about 66 or 40 years  ago she was having spells of anxiety. She was referred to see a psychiatrist at [facility]. She briefly took Xanax but didn't stay on it for very long. It sounds like she was also referred for some relaxation therapy. Never had psychiatric hospitalization never had psychiatric treatment other than  described above. No history of suicide or psychotic disorder.  Risk to Self: Is patient at risk for suicide?: No Risk to Others:   Prior Inpatient Therapy:   Prior Outpatient Therapy:    Past Medical History:  Past Medical History  Diagnosis Date  . CHF (congestive heart failure) ([place])   . Thyroid disease   . Diabetes mellitus without complication ([place])   . Hypertension   . Hyperlipidemia   . Arrhythmia     PVC  . COPD (chronic obstructive pulmonary disease) ([place])   . Chronic kidney disease   . OSA (obstructive sleep apnea) 2011  . Anemia   . Anxiety and depression     [name]  . Lymphoma ([place])   . Osteoarthritis   . Lymphedema     Past Surgical History  Procedure Laterality Date  . Tonsillectomy and adenoidectomy    . Appendectomy    . Knee surgery Left   . Abdominal hysterectomy    . Cardiac catheterization    . Coronary angioplasty      x4  . Cardiac stents     Family History:  Family History  Problem Relation Age of Onset  . Aneurysm Mother   . Stroke Mother   . Liver cancer Father    Family Psychiatric  History: She had an older sister who had an alcohol problem and that is the only family history she knows of. Social History:  History  Alcohol Use No     History  Drug Use No    Social History   Social History  . Marital Status: Widowed    Spouse Name: N/A  . Number of Children: N/A  . Years of Education: N/A   Social History Main Topics  . Smoking status: Former Smoker -- 1.50 packs/day for 18 years    Quit date: 06/25/1975  . Smokeless tobacco: Never Used  . Alcohol Use: No  . Drug Use: No  . Sexual Activity: Not Asked   Other Topics Concern  . None   Social History Narrative   Additional Social History:    Allergies:   Allergies  Allergen Reactions  . Amlodipine Swelling    Leg swelling only   . Oxycodone Other (See Comments)    Reaction: patient states she was "out" for 2 hours and did not notice  people in the area    . Demerol [Meperidine] Diarrhea and Nausea And Vomiting  . Lisinopril Cough  . Tape Rash    Labs:  Results for orders placed or performed during the hospital encounter of 12/15/15 (from the past 48 hour(s))  Glucose, capillary     Status: Abnormal   Collection Time: 12/16/15  8:36 PM  Result Value Ref Range   Glucose-Capillary 224 (H) 65 - 99 mg/dL   Comment 1 Notify RN   Basic metabolic panel     Status: Abnormal   Collection Time: 12/17/15  5:35 AM  Result Value Ref Range   Sodium 142 135 - 145 mmol/L   Potassium 4.1 3.5 - 5.1 mmol/L   Chloride 106 101 - 111 mmol/L   CO2 28 22 - 32 mmol/L   Glucose, Bld 253 (H)  65 - 99 mg/dL   BUN 48 (H) 6 - 20 mg/dL   Creatinine, Ser 1.70 (H) 0.44 - 1.00 mg/dL   Calcium 8.6 (L) 8.9 - 10.3 mg/dL   GFR calc non Af Amer 27 (L) >60 mL/min   GFR calc Af Amer 31 (L) >60 mL/min    Comment: (NOTE) The eGFR has been calculated using the CKD EPI equation. This calculation has not been validated in all clinical situations. eGFR's persistently <60 mL/min signify possible Chronic Kidney Disease.    Anion gap 8 5 - 15  Hemoglobin     Status: Abnormal   Collection Time: 12/17/15  5:35 AM  Result Value Ref Range   Hemoglobin 8.8 (L) 12.0 - 16.0 g/dL  Glucose, capillary     Status: Abnormal   Collection Time: 12/17/15  7:41 AM  Result Value Ref Range   Glucose-Capillary 225 (H) 65 - 99 mg/dL  Glucose, capillary     Status: Abnormal   Collection Time: 12/17/15 12:05 PM  Result Value Ref Range   Glucose-Capillary 209 (H) 65 - 99 mg/dL  Blood gas, arterial     Status: Abnormal   Collection Time: 12/17/15  4:05 PM  Result Value Ref Range   FIO2 0.28    pH, Arterial 7.37 7.350 - 7.450   pCO2 arterial 57 (H) 32.0 - 48.0 mmHg   pO2, Arterial 71 (L) 83.0 - 108.0 mmHg   Bicarbonate 33.0 (H) 21.0 - 28.0 mEq/L   Acid-Base Excess 6.2 (H) 0.0 - 3.0 mmol/L   O2 Saturation 93.5 %   Patient temperature 37.0    Collection site RIGHT  RADIAL    Sample type ARTERIAL DRAW    Allens test (pass/fail) POSITIVE (A) PASS  Glucose, capillary     Status: Abnormal   Collection Time: 12/17/15  4:39 PM  Result Value Ref Range   Glucose-Capillary 182 (H) 65 - 99 mg/dL  Glucose, capillary     Status: Abnormal   Collection Time: 12/17/15  8:45 PM  Result Value Ref Range   Glucose-Capillary 156 (H) 65 - 99 mg/dL  Hemoglobin     Status: Abnormal   Collection Time: 12/18/15  6:04 AM  Result Value Ref Range   Hemoglobin 8.5 (L) 12.0 - 16.0 g/dL  Glucose, capillary     Status: Abnormal   Collection Time: 12/18/15  7:19 AM  Result Value Ref Range   Glucose-Capillary 207 (H) 65 - 99 mg/dL  Ammonia     Status: None   Collection Time: 12/18/15  8:20 AM  Result Value Ref Range   Ammonia 20 9 - 35 umol/L  Glucose, capillary     Status: Abnormal   Collection Time: 12/18/15 11:05 AM  Result Value Ref Range   Glucose-Capillary 125 (H) 65 - 99 mg/dL  Glucose, capillary     Status: Abnormal   Collection Time: 12/18/15  4:46 PM  Result Value Ref Range   Glucose-Capillary 165 (H) 65 - 99 mg/dL    Current Facility-Administered Medications  Medication Dose Route Frequency Provider Last Rate Last Dose  . 0.9 %  sodium chloride infusion  250 mL Intravenous PRN Demetrios Loll, MD      . acetaminophen (TYLENOL) tablet 650 mg  650 mg Oral Q4H PRN Demetrios Loll, MD   650 mg at 12/18/15 1842  . albuterol (PROVENTIL) (2.5 MG/3ML) 0.083% nebulizer solution 2.5 mg  2.5 mg Nebulization Q6H PRN Demetrios Loll, MD      . atorvastatin (LIPITOR) tablet 80 mg  80 mg Oral QHS Demetrios Loll, MD   80 mg at 12/17/15 2249  . baclofen (LIORESAL) tablet 5 mg  5 mg Oral QHS PRN Saundra Shelling, MD   5 mg at 12/16/15 2148  . bisacodyl (DULCOLAX) EC tablet 5 mg  5 mg Oral Daily PRN Demetrios Loll, MD      . calcitRIOL (ROCALTROL) capsule 0.25 mcg  0.25 mcg Oral Q M,W,F Demetrios Loll, MD   0.25 mcg at 12/18/15 1103  . docusate sodium (COLACE) capsule 100 mg  100 mg Oral Daily PRN Demetrios Loll, MD       . famotidine (PEPCID) 10 mg in sodium chloride 0.9 % 25 mL  10 mg Intravenous Q12H Sheema M Hallaji, RPH      . fluticasone (FLONASE) 50 MCG/ACT nasal spray 1 spray  1 spray Each Nare Daily PRN Demetrios Loll, MD   1 spray at 12/16/15 2152  . furosemide (LASIX) tablet 40 mg  40 mg Oral Daily Theodoro Grist, MD   40 mg at 12/18/15 1103  . gabapentin (NEURONTIN) capsule 300 mg  300 mg Oral BID Demetrios Loll, MD   300 mg at 12/18/15 1103  . insulin aspart (novoLOG) injection 0-5 Units  0-5 Units Subcutaneous QHS Demetrios Loll, MD   2 Units at 12/16/15 2149  . insulin aspart (novoLOG) injection 0-9 Units  0-9 Units Subcutaneous TID WC Demetrios Loll, MD   2 Units at 12/18/15 1738  . insulin glargine (LANTUS) injection 50 Units  50 Units Subcutaneous QHS Demetrios Loll, MD   50 Units at 12/17/15 2252  . latanoprost (XALATAN) 0.005 % ophthalmic solution 1 drop  1 drop Both Eyes QHS Demetrios Loll, MD   1 drop at 12/17/15 2249  . levothyroxine (SYNTHROID, LEVOTHROID) tablet 137 mcg  137 mcg Oral QAC breakfast Demetrios Loll, MD   137 mcg at 12/18/15 [phone]  . levothyroxine (SYNTHROID, LEVOTHROID) tablet 137 mcg  137 mcg Oral Q Joeseph Amor, MD   137 mcg at 12/15/15 1322  . metoprolol tartrate (LOPRESSOR) tablet 12.5 mg  12.5 mg Oral BID Demetrios Loll, MD   12.5 mg at 12/18/15 1103  . mometasone-formoterol (DULERA) 200-5 MCG/ACT inhaler 2 puff  2 puff Inhalation BID Demetrios Loll, MD   2 puff at 12/18/15 1102  . montelukast (SINGULAIR) tablet 10 mg  10 mg Oral QHS Demetrios Loll, MD   10 mg at 12/17/15 2249  . nitroGLYCERIN (NITROSTAT) SL tablet 0.4 mg  0.4 mg Sublingual Q5 min PRN Demetrios Loll, MD      . ondansetron [facility]) injection 4 mg  4 mg Intravenous Q6H PRN Demetrios Loll, MD   4 mg at 12/17/15 1210  . polyethylene glycol (MIRALAX / GLYCOLAX) packet 17 g  17 g Oral Daily PRN Demetrios Loll, MD      . senna [facility]) tablet 8.6 mg  1 tablet Oral Daily Theodoro Grist, MD   8.6 mg at 12/18/15 1103  . senna-docusate (Senokot-S) tablet 2 tablet  2 tablet Oral  BID PRN Demetrios Loll, MD      . sodium chloride flush (NS) 0.9 % injection 3 mL  3 mL Intravenous Q12H Demetrios Loll, MD   3 mL at 12/18/15 1104  . sodium chloride flush (NS) 0.9 % injection 3 mL  3 mL Intravenous PRN Demetrios Loll, MD      . venlafaxine [facility]) tablet 75 mg  75 mg Oral BID Demetrios Loll, MD   75 mg at 12/18/15 1103    Musculoskeletal: Strength &  Muscle Tone: decreased Gait & Station: unable to stand Patient leans: N/A  Psychiatric Specialty Exam: Physical Exam  Nursing note and vitals reviewed. Constitutional: She appears well-developed and well-nourished.  HENT:  Head: Normocephalic and atraumatic.  Eyes: Conjunctivae are normal. Pupils are equal, round, and reactive to light.  Neck: Normal range of motion.  Cardiovascular: Normal heart sounds.   Respiratory: Effort normal. No respiratory distress.    GI: Soft.  Musculoskeletal: Normal range of motion.       Legs: Neurological: She is alert.  Skin: Skin is warm and dry.  Psychiatric: She has a normal mood and affect. Her behavior is normal. Judgment and thought content normal. Her speech is tangential. Cognition and memory are normal.    Review of Systems  Constitutional: Negative.   HENT: Negative.   Eyes: Negative.   Respiratory: Negative.   Cardiovascular: Negative.   Gastrointestinal: Negative.   Musculoskeletal: Negative.   Skin: Negative.   Neurological: Negative.   Psychiatric/Behavioral: Positive for hallucinations. Negative for depression, suicidal ideas, memory loss and substance abuse. The patient is not nervous/anxious and does not have insomnia.     Blood pressure 112/49, pulse 72, temperature 98.4 F (36.9 C), temperature source Oral, resp. rate 16, height _0  (1.626 m), weight 112.946 kg (249 lb), SpO2 96 %.Body mass index is 42.72 kg/(m^2).  General Appearance: Fairly Groomed  Eye Contact:  Good  Speech:  Clear and Coherent  Volume:  Normal  Mood:  Euthymic  Affect:  Appropriate  Thought  Process:  Disorganized and Descriptions of Associations: Tangential  Orientation:  Full (Time, Place, and Person)  Thought Content:  Hallucinations: Visual and Tangential  Suicidal Thoughts:  No  Homicidal Thoughts:  No  Memory:  Immediate;   Good Recent;   Good Remote;   Good  Judgement:  Fair  Insight:  Fair  Psychomotor Activity:  Decreased  Concentration:  Concentration: Fair  Recall:  [place] of Knowledge:  Fair  Language:  Fair  Akathisia:  No  Handed:  Right  AIMS (if indicated):     Assets:  Communication Skills Desire for Improvement Housing Resilience Social Support  ADL's:  Impaired  Cognition:  WNL  Sleep:        Treatment Plan Summary: Plan 82 year old woman. If I can take her history at face value, she is having visual hallucinations that happen almost exclusively at night and which are causing her little or no distress. She does not have any psychotic or delusional content. She is aware that they are only hallucinations. She does not have any mood symptoms or paranoia or psychosis during the day. As I explained to her, visual hallucinations are relatively calm and especially among older people and do not represent a psychotic disorder. They are more common among people with visual problems (which she has) and with interrupted sleep. Narcotics are frequently associated with visual hallucinations. Patient is not complaining of terrible pain right now. We discussed how it would be better to avoid narcotics as much is possible as long as her pain was still under adequate control without them. She agreed to this. Under the circumstances, I do not think it would be a good idea to add antipsychotics at this point. Antipsychotics would come with side effects including an increased risk of death in the elderly and would increase further her risk of falls. All of that while being only partially effective for visual hallucinations. Instead, and a person as intact as she is, I  encouraged her to try seeing if she could simply ignore them and live with them for now. Patient seemed perfectly content with this suggestion. No medicine added. I will follow-up as needed.  Disposition: Patient does not meet criteria for psychiatric inpatient admission. Supportive therapy provided about ongoing stressors.  Alethia Berthold, MD 12/18/2015 7:32 PM

## 2015-12-18 NOTE — Care Management Important Message (Signed)
Important Message  Patient Details  Name: Jill York MRN: [id number] Date of Birth: Oct 22, 1930   Medicare Important Message Given:  Yes    Jolly Mango, RN 12/18/2015, 11:16 AM

## 2015-12-18 NOTE — Progress Notes (Signed)
  Jill Lame, MD [facility]   [address], [place], [place] [address] Phone: [phone] Fax : [phone]   Subjective: The patient has been suffering with confusion. There has been a report of some bloody bowel movements yesterday. The patient denies any abdominal pain. Her hemoglobin has been stable.   Objective: Vital signs in last 24 hours: Filed Vitals:   12/17/15 2115 12/18/15 0429 12/18/15 1102 12/18/15 1509  BP: 106/82 119/64 150/88 112/49  Pulse: 84 92 73 72  Temp:  98.4 F (36.9 C) 98.4 F (36.9 C)   TempSrc:  Oral Oral   Resp:  20 16   Height:      Weight:      SpO2: 99% 96% 91% 96%   Weight change:   Intake/Output Summary (Last 24 hours) at 12/18/15 1715 Last data filed at 12/18/15 1335  Gross per 24 hour  Intake    360 ml  Output   1400 ml  Net  -1040 ml     Exam: Alert and pleasant and in no apparent distress    Lab Results: @LABTEST2 @ Micro Results: No results found for this or any previous visit (from the past 240 hour(s)). Studies/Results: No results found. Medications: I have reviewed the patient's current medications. Scheduled Meds: . atorvastatin  80 mg Oral QHS  . calcitRIOL  0.25 mcg Oral Q M,W,F  . famotidine (PEPCID) IV  10 mg Intravenous Q12H  . furosemide  40 mg Oral Daily  . gabapentin  300 mg Oral BID  . insulin aspart  0-5 Units Subcutaneous QHS  . insulin aspart  0-9 Units Subcutaneous TID WC  . insulin glargine  50 Units Subcutaneous QHS  . latanoprost  1 drop Both Eyes QHS  . levothyroxine  137 mcg Oral QAC breakfast  . levothyroxine  137 mcg Oral Q Fri  . metoprolol tartrate  12.5 mg Oral BID  . mometasone-formoterol  2 puff Inhalation BID  . montelukast  10 mg Oral QHS  . senna  1 tablet Oral Daily  . sodium chloride flush  3 mL Intravenous Q12H  . venlafaxine  75 mg Oral BID   Continuous Infusions:  PRN Meds:.sodium chloride, acetaminophen, albuterol, baclofen, bisacodyl, docusate sodium, fluticasone,  nitroGLYCERIN, ondansetron (ZOFRAN) IV, polyethylene glycol, senna-docusate, sodium chloride flush   Assessment: Active Problems:   NSTEMI (non-ST elevated myocardial infarction) ([place])   Acute blood loss anemia   Hematochezia    Plan: This patient is being followed for rectal bleeding. The patient had an EGD and colonoscopy within the last 6 months. She has a report of some rectal bleeding yesterday but her hemoglobin has been stable. The patient likely has hemorrhoids as the cause of her bleeding. I would not recommend repeating any of the procedures at the present time.    LOS: 3 days   Jill York 12/18/2015, 5:15 PM

## 2015-12-18 NOTE — NC FL2 (Signed)
[place] LEVEL OF CARE SCREENING TOOL     IDENTIFICATION  Patient Name: Jill York Birthdate: [birth year] Sex: female Admission Date (Current Location): 12/15/2015  [place] and [place] Number:  Engineering geologist and Address:  [facility], [address], [place], [place] [address]      Provider Number: [id number]  Attending Physician Name and Address:  Theodoro Grist, MD  Relative Name and Phone Number:       Current Level of Care: Hospital Recommended Level of Care: [place] Prior Approval Number:    Date Approved/Denied:   PASRR Number:  [id number]   Discharge Plan: SNF    Current Diagnoses: Patient Active Problem List   Diagnosis Date Noted  . NSTEMI (non-ST elevated myocardial infarction) ([place]) 12/15/2015  . Acute blood loss anemia   . Hematochezia   . Acute respiratory failure with hypoxia ([place]) 01/01/2015  . Chronic diastolic heart failure ([place]) 11/01/2014    Orientation RESPIRATION BLADDER Height & Weight     Self, Place  O2 (Nasal Cannula (1 L/min)) Incontinent Weight: 249 lb (112.946 kg) Height:  5\' 4"  (162.6 cm)  BEHAVIORAL SYMPTOMS/MOOD NEUROLOGICAL BOWEL NUTRITION STATUS   (None)  (None) Incontinent Diet (Heart)  AMBULATORY STATUS COMMUNICATION OF NEEDS Skin   Extensive Assist Verbally Normal                       Personal Care Assistance Level of Assistance  Bathing, Feeding, Dressing Bathing Assistance: Limited assistance Feeding assistance: Independent Dressing Assistance: Limited assistance     Functional Limitations Info  Sight, Hearing, Speech Sight Info: Adequate Hearing Info: Adequate Speech Info: Adequate    SPECIAL CARE FACTORS FREQUENCY  PT (By licensed PT)     PT Frequency: 5              Contractures      Additional Factors Info  Code Status, Allergies, Insulin Sliding Scale Code Status Info: DNR Allergies Info: Amlodipine, Oxycodone, Demerol,  Lisinopril, Tape   Insulin Sliding Scale Info: insulin glargine (LANTUS) injection 50 Units 50 Units, Subcutaneous, Daily at bedtime   Comments: Give half dose if still NPO. insulin aspart (novoLOG) injection 0-9 Units 0-9 Units, Subcutaneous, 3 times daily with meals  insulin aspart (novoLOG) injection 0-5 Units 0-5 Units, Subcutaneous, Daily at bedtime       Current Medications (12/18/2015):  This is the current hospital active medication list Current Facility-Administered Medications  Medication Dose Route Frequency Provider Last Rate Last Dose  . 0.9 %  sodium chloride infusion  250 mL Intravenous PRN Demetrios Loll, MD      . acetaminophen (TYLENOL) tablet 650 mg  650 mg Oral Q4H PRN Demetrios Loll, MD   650 mg at 12/16/15 1744  . albuterol (PROVENTIL) (2.5 MG/3ML) 0.083% nebulizer solution 2.5 mg  2.5 mg Nebulization Q6H PRN Demetrios Loll, MD      . atorvastatin (LIPITOR) tablet 80 mg  80 mg Oral QHS Demetrios Loll, MD   80 mg at 12/17/15 2249  . baclofen (LIORESAL) tablet 5 mg  5 mg Oral QHS PRN Saundra Shelling, MD   5 mg at 12/16/15 2148  . bisacodyl (DULCOLAX) EC tablet 5 mg  5 mg Oral Daily PRN Demetrios Loll, MD      . calcitRIOL (ROCALTROL) capsule 0.25 mcg  0.25 mcg Oral Q M,W,F Demetrios Loll, MD   0.25 mcg at 12/18/15 1103  . docusate sodium (COLACE) capsule 100 mg  100  mg Oral Daily PRN Demetrios Loll, MD      . famotidine (PEPCID) 10 mg in sodium chloride 0.9 % 25 mL  10 mg Intravenous Q12H Sheema M Hallaji, RPH      . fluticasone (FLONASE) 50 MCG/ACT nasal spray 1 spray  1 spray Each Nare Daily PRN Demetrios Loll, MD   1 spray at 12/16/15 2152  . furosemide (LASIX) tablet 40 mg  40 mg Oral Daily Theodoro Grist, MD   40 mg at 12/18/15 1103  . gabapentin (NEURONTIN) capsule 300 mg  300 mg Oral BID Demetrios Loll, MD   300 mg at 12/18/15 1103  . insulin aspart (novoLOG) injection 0-5 Units  0-5 Units Subcutaneous QHS Demetrios Loll, MD   2 Units at 12/16/15 2149  . insulin aspart (novoLOG) injection 0-9 Units  0-9 Units  Subcutaneous TID WC Demetrios Loll, MD   1 Units at 12/18/15 1255  . insulin glargine (LANTUS) injection 50 Units  50 Units Subcutaneous QHS Demetrios Loll, MD   50 Units at 12/17/15 2252  . latanoprost (XALATAN) 0.005 % ophthalmic solution 1 drop  1 drop Both Eyes QHS Demetrios Loll, MD   1 drop at 12/17/15 2249  . levothyroxine (SYNTHROID, LEVOTHROID) tablet 137 mcg  137 mcg Oral QAC breakfast Demetrios Loll, MD   137 mcg at 12/18/15 [phone]  . levothyroxine (SYNTHROID, LEVOTHROID) tablet 137 mcg  137 mcg Oral Q Joeseph Amor, MD   137 mcg at 12/15/15 1322  . metoprolol tartrate (LOPRESSOR) tablet 12.5 mg  12.5 mg Oral BID Demetrios Loll, MD   12.5 mg at 12/18/15 1103  . mometasone-formoterol (DULERA) 200-5 MCG/ACT inhaler 2 puff  2 puff Inhalation BID Demetrios Loll, MD   2 puff at 12/18/15 1102  . montelukast (SINGULAIR) tablet 10 mg  10 mg Oral QHS Demetrios Loll, MD   10 mg at 12/17/15 2249  . nitroGLYCERIN (NITROSTAT) SL tablet 0.4 mg  0.4 mg Sublingual Q5 min PRN Demetrios Loll, MD      . ondansetron [facility]) injection 4 mg  4 mg Intravenous Q6H PRN Demetrios Loll, MD   4 mg at 12/17/15 1210  . polyethylene glycol (MIRALAX / GLYCOLAX) packet 17 g  17 g Oral Daily PRN Demetrios Loll, MD      . senna [facility]) tablet 8.6 mg  1 tablet Oral Daily Theodoro Grist, MD   8.6 mg at 12/18/15 1103  . senna-docusate (Senokot-S) tablet 2 tablet  2 tablet Oral BID PRN Demetrios Loll, MD      . sodium chloride flush (NS) 0.9 % injection 3 mL  3 mL Intravenous Q12H Demetrios Loll, MD   3 mL at 12/18/15 1104  . sodium chloride flush (NS) 0.9 % injection 3 mL  3 mL Intravenous PRN Demetrios Loll, MD      . venlafaxine [facility]) tablet 75 mg  75 mg Oral BID Demetrios Loll, MD   75 mg at 12/18/15 1103     Discharge Medications: Please see discharge summary for a list of discharge medications.  Relevant Imaging Results:  Relevant Lab Results:   Additional Information [id number]  [place], LCSW

## 2015-12-18 NOTE — Clinical Social Work Note (Signed)
Clinical Social Work Assessment  Patient Details  Name: Jill York MRN: [id number] Date of Birth: 1931-04-19  Date of referral:  12/18/15               Reason for consult:  Discharge Planning                Permission sought to share information with:  Family Supports Permission granted to share information::  Yes, Verbal Permission Granted  Name::        Agency::     Relationship::   (Jill York- Daughter )  Contact Information:     Housing/Transportation Living arrangements for the past 2 months:  Single Family Home Source of Information:  Patient, Adult Children Patient Interpreter Needed:  None Criminal Activity/Legal Involvement Pertinent to Current Situation/Hospitalization:  No - Comment as needed Significant Relationships:  Adult Children, Other Family Members Lives with:  Self Do you feel safe going back to the place where you live?  Yes Need for family participation in patient care:  Yes (Comment) (Jill York- Daughter )  Care giving concerns:  Patient and her daughter feel that patient will benefit from SNF placement at discharge.    Social Worker assessment / plan:  CSW met with patient and daughter at bedside. Introduced herself and her role. Was granted verbal permission to discuss discharge plans in front of patient's daughter. Per patient she's been to rehab before in the past. Reported that she prefers to go to [facility] or [facility] in [place] CSW verbal permission to send SNF referral to SNFs in [facility] and [place] completed, placed in MDs basket for signature and faxed to SNFs. Awaiting bed offers.   Presented bed offers. Patient accepted bed offer at [facility]. CSW informed Seth Bake- Admissions Coordinator of above. CSW will continue to follow and assist.    Employment status:  Retired Forensic scientist:  Medicare PT Recommendations:  [place] / Referral to community resources:  [place]  Patient/Family's Response to care:  Patient and her daughter are in agreement to discharge to [facility] when medically stable.   Patient/Family's Understanding of and Emotional Response to Diagnosis, Current Treatment, and Prognosis:  Patient and her daughter are appreciative of CSWs assistance.   Emotional Assessment Appearance:  Appears stated age Attitude/Demeanor/Rapport:   (None) Affect (typically observed):  Calm, Pleasant, Accepting Orientation:  Oriented to Self Alcohol / Substance use:  Not Applicable Psych involvement (Current and /or in the community):  No (Comment)  Discharge Needs  Concerns to be addressed:  Discharge Planning Concerns Readmission within the last 30 days:  No Current discharge risk:  Chronically ill Barriers to Discharge:  Continued Medical Work up   [employer], LCSW 12/18/2015, 4:17 PM

## 2015-12-18 NOTE — Progress Notes (Signed)
[facility] [place] NAME: Jill York    MR#:  [id number]  DATE OF BIRTH:  1930-07-24  SUBJECTIVE:  CHIEF COMPLAINT:   Chief Complaint  Patient presents with  . Weakness  Patient is 82 year old female with past medical history significant for history of multiple medical problems including congestive heart failure, diabetes mellitus, hypertension, hyperlipidemia, arrhythmia, COPD, CK D, who presents to the hospital with generalized weakness and fall, but no loss of consciousness or syncope. She was complaining of intermittent bloody stool for the past one week, while on Eliquis. In emergency room, she was noted to have mildly elevated troponin, which was felt to be demand ischemia, but not acute coronary syndrome per cardiologist. Hemoglobin level was found to be low at 8.4 on admission, the patient was transfused 1 unit of packed red blood cells due to elevated troponin. She is admitted to the hospital for further evaluation. Gastrointestinal consultation was obtained, gastroenterologist. Noted that patient had colonoscopy and upper endoscopy within the past year, no further interventions were recommended by gastroenterologist, but conservative therapy. Cardiology saw patient in consultation and recommended to hold blood thinners, but no further cardiac diagnostics. Patient had fall Yesterday with no injury, per nursing staff. Patient complained of right shoulder pain, x-ray done yesterday showed no fracture or subluxation. Patient is more confused today, hallucinating, seeing people in the room  Review of Systems  Unable to perform ROS: other  asleep  VITAL SIGNS: Blood pressure 112/49, pulse 72, temperature 98.4 F (36.9 C), temperature source Oral, resp. rate 16, height 5\' 4"  (1.626 m), weight 112.946 kg (249 lb), SpO2 96 %.  PHYSICAL EXAMINATION:   GENERAL:  82 y.o.-year-old patient lying in the bed with no acute distress, although more  agitated, restless, today, uncomfortable,  stressed-out.  EYES: Pupils equal, round, reactive to light and accommodation. No scleral icterus. Extraocular muscles intact.  HEENT: Head atraumatic, normocephalic. Oropharynx and nasopharynx clear.  NECK:  Supple, no jugular venous distention. No thyroid enlargement, no tenderness.  LUNGS: Normal breath sounds bilaterally, no wheezing, rales,rhonchi or crepitation. No use of accessory muscles of respiration.  CARDIOVASCULAR: S1, S2 normal. No murmurs, rubs, or gallops.  ABDOMEN: Soft, nontender, nondistended. Bowel sounds present. No organomegaly or mass.  EXTREMITIES: 1-2+ lower extremity and pedal edema, no cyanosis, or clubbing.  NEUROLOGIC: Cranial nerves II through XII are grossly intact  Muscle strength 5 out of 5 in all extremities. Sensation intact to light touch. Gait not checked.  PSYCHIATRIC: The patient is some agitated, pressured speech SKIN: No obvious rash, lesion, or ulcer.   ORDERS/RESULTS REVIEWED:   CBC  Recent Labs Lab 12/15/15 0828 12/16/15 0439 12/17/15 0535 12/18/15 0604  WBC 8.2  --   --   --   HGB 8.4* 9.1* 8.8* 8.5*  HCT 26.1*  --   --   --   PLT 175  --   --   --   MCV 96.0  --   --   --   MCH 31.0  --   --   --   MCHC 32.3  --   --   --   RDW 19.0*  --   --   --   LYMPHSABS 1.5  --   --   --   MONOABS 0.4  --   --   --   EOSABS 0.1  --   --   --   BASOSABS 0.0  --   --   --    ------------------------------------------------------------------------------------------------------------------  Chemistries   Recent Labs Lab 12/15/15 0828 12/15/15 1225 12/17/15 0535  NA 140  --  142  K 4.4  --  4.1  CL 105  --  106  CO2 25  --  28  GLUCOSE 105*  --  253*  BUN 49*  --  48*  CREATININE 2.10*  --  1.70*  CALCIUM 8.7*  --  8.6*  MG  --  2.3  --   AST 34  --   --   ALT 30  --   --   ALKPHOS 77  --   --   BILITOT 0.7  --   --     ------------------------------------------------------------------------------------------------------------------ estimated creatinine clearance is 30.9 mL/min (by C-G formula based on Cr of 1.7). ------------------------------------------------------------------------------------------------------------------ No results for input(s): TSH, T4TOTAL, T3FREE, THYROIDAB in the last 72 hours.  Invalid input(s): FREET3  Cardiac Enzymes  Recent Labs Lab 12/15/15 0828 12/15/15 1349 12/15/15 2007  TROPONINI 0.77* 0.65* 0.45*   ------------------------------------------------------------------------------------------------------------------ Invalid input(s): POCBNP ---------------------------------------------------------------------------------------------------------------  RADIOLOGY: No results found.  EKG:  Orders placed or performed during the hospital encounter of 12/15/15  . ED EKG  . ED EKG    ASSESSMENT AND PLAN:  Active Problems:   NSTEMI (non-ST elevated myocardial infarction) ([place])   Acute blood loss anemia   Hematochezia #1. Acute on chronic posthemorrhagic symptomatic anemia, status post 1 unit of packed red blood cell transfusion, hemoglobin level is stable at 8.5  today , the patient is on a regular diet #2. Elevated troponin due to demand ischemia, no further diagnostics per cardiologist. Continue Lipitor and metoprolol, not on aspirin due to GI bleed #3. Gastrointestinal bleed due to unclear etiology at present, status post EGD and colonoscopy within within 1 year, which showed only polyps in colon, removed, s/p  clip placement , no further diagnostics were recommended by gastroenterologist as long as bleeding subsides, holding anticoagulation with Eliquis.  Following patient closely and getting gastroenterologist back if recurrent bleeding while off anticoagulation . Continue soft diet #4. Acute on chronic renal failure, status post transfusion, creatinine has  improved, which is her baseline at 1.5-1.7. #5 . Hallucinations,  patient's family endorse hallucinations at home , getting worse over the past few months, waiting for psychiatrist input , ammonia level was normal #6. Right shoulder pain, no injury on x-ray #7 fall, generalized weakness, physical therapist recommended skilled nursing facility placement #8. History of obstructive sleep apnea, mild chronic CO2 retention, respiratory therapist was recommended to decrease oxygen flow and keep pulse oximetry at 88-92%, not higher.    Management plans discussed with the patient, family and they are in agreement.   DRUG ALLERGIES:  Allergies  Allergen Reactions  . Amlodipine Swelling    Leg swelling only   . Oxycodone Other (See Comments)    Reaction: patient states she was "out" for 2 hours and did not notice people in the area    . Demerol [Meperidine] Diarrhea and Nausea And Vomiting  . Lisinopril Cough  . Tape Rash    CODE STATUS:     Code Status Orders        Start     Ordered   12/15/15 1200  Do not attempt resuscitation (DNR)   Continuous    Question Answer Comment  In the event of cardiac or respiratory ARREST Do not call a "code blue"   In the event of cardiac or respiratory ARREST Do not perform Intubation, CPR, defibrillation or ACLS   In the event of  cardiac or respiratory ARREST Use medication by any route, position, wound care, and other measures to relive pain and suffering. May use oxygen, suction and manual treatment of airway obstruction as needed for comfort.      12/15/15 1159    Code Status History    Date Active Date Inactive Code Status Order ID Comments User Context   01/01/2015 10:29 AM 01/04/2015  1:43 PM DNR [id number]  Loletha Grayer, MD ED    Advance Directive Documentation        Most Recent Value   Type of Advance Directive  Healthcare Power of Attorney   Pre-existing out of facility DNR order (yellow form or pink MOST form)     "MOST" Form in  Place?        TOTAL TIME TAKING CARE OF THIS PATIENT: 40 minutes.  Discussed with patient's daughter MelodyAgain today, all questions were answered  Kagen Kunath M.D on 12/18/2015 at 4:37 PM  Between 7am to 6pm - Pager - [phone]  After 6pm go to [email or web address] - password [email or web address] [facility]  Office  [phone]  CC: Primary care physician; Sharyne Peach, MD

## 2015-12-18 NOTE — Progress Notes (Signed)
Inpatient Diabetes Program Recommendations  AACE/ADA: New Consensus Statement on Inpatient Glycemic Control (2015)  Target Ranges:  Prepandial:   less than 140 mg/dL      Peak postprandial:   less than 180 mg/dL (1-2 hours)      Critically ill patients:  140 - 180 mg/dL   Results for DAILYN, MCGEEHAN (MRN [id number]) as of 12/18/2015 10:06  Ref. Range 12/17/2015 07:41 12/17/2015 12:05 12/17/2015 16:39 12/17/2015 20:45 12/18/2015 07:19  Glucose-Capillary Latest Ref Range: 65-99 mg/dL 225 (H) 209 (H) 182 (H) 156 (H) 207 (H)   Review of Glycemic Control  Diabetes history: DM2 Outpatient Diabetes medications: Lantus 50 units QHS, Humalog 20 units with breakfast, 26 units with lunch, 20 units with supper Current orders for Inpatient glycemic control: Lantus 50 units QHS, Novolog 0-9 units TID with meals, Novolog 0-5 units QHS  Inpatient Diabetes Program Recommendations: Insulin - Meal Coverage: Post prandial glucose is consistently elevated. Please consider ordering Novolog 5 units TID with meals for meal coverage. Insulin-Basal: Please consider increasing Lantus to 52 units QHS.  Thanks, Barnie Alderman, RN, MSN, CDE Diabetes Coordinator Inpatient Diabetes Program [phone] (Team Pager from [place] to [place]) [phone] (AP office) [phone] [facility] office) [phone] [facility] office)

## 2015-12-18 NOTE — Care Management (Signed)
Psych consult pending due to hallucinations. PT recommending SNF. CSW consulted.

## 2015-12-18 NOTE — Plan of Care (Signed)
Problem: Activity: Goal: Risk for activity intolerance will decrease Outcome: Progressing Up in chair with PT

## 2015-12-19 ENCOUNTER — Inpatient Hospital Stay: Payer: Medicare Other

## 2015-12-19 LAB — GLUCOSE, CAPILLARY
GLUCOSE-CAPILLARY: 228 mg/dL — AB (ref 65–99)
GLUCOSE-CAPILLARY: 250 mg/dL — AB (ref 65–99)
Glucose-Capillary: 208 mg/dL — ABNORMAL HIGH (ref 65–99)
Glucose-Capillary: 207 mg/dL — ABNORMAL HIGH (ref 65–99)

## 2015-12-19 MED ORDER — TRAMADOL HCL 50 MG PO TABS: 50.0000 mg | ORAL_TABLET | Freq: Four times a day (QID) | ORAL | Status: DC | PRN

## 2015-12-19 MED ORDER — APIXABAN 2.5 MG PO TABS
2.5000 mg | ORAL_TABLET | Freq: Two times a day (BID) | ORAL | Status: DC
  Administered 2015-12-19 – 2015-12-20 (×3): 2.5 mg via ORAL
  Filled 2015-12-19 (×3): qty 1

## 2015-12-19 MED ORDER — TRAZODONE HCL 50 MG PO TABS: 50.0000 mg | ORAL_TABLET | Freq: Every evening | ORAL | Status: DC | PRN

## 2015-12-19 NOTE — Progress Notes (Signed)
ANTICOAGULATION CONSULT NOTE - Initial Consult  Pharmacy Consult for Apixaban Indication: atrial fibrillation  Allergies  Allergen Reactions  . Amlodipine Swelling    Leg swelling only   . Oxycodone Other (See Comments)    Reaction: patient states she was "out" for 2 hours and did not notice people in the area    . Demerol [Meperidine] Diarrhea and Nausea And Vomiting  . Lisinopril Cough  . Tape Rash    Patient Measurements: Height: 5\' 4"  (162.6 cm) Weight: 249 lb (112.946 kg) IBW/kg (Calculated) : 54.7   Vital Signs: Temp: 98.1 F (36.7 C) (06/27 0450) Temp Source: Oral (06/27 0450) BP: 99/59 mmHg (06/27 0451) Pulse Rate: 78 (06/27 0451)  Labs:  Recent Labs  12/17/15 0535 12/18/15 0604  HGB 8.8* 8.5*  CREATININE 1.70*  --     Estimated Creatinine Clearance: 30.9 mL/min (by C-G formula based on Cr of 1.7).   Medical History: Past Medical History  Diagnosis Date  . CHF (congestive heart failure) ([place])   . Thyroid disease   . Diabetes mellitus without complication ([place])   . Hypertension   . Hyperlipidemia   . Arrhythmia     PVC  . COPD (chronic obstructive pulmonary disease) ([place])   . Chronic kidney disease   . OSA (obstructive sleep apnea) 2011  . Anemia   . Anxiety and depression     [name]  . Lymphoma ([place])   . Osteoarthritis   . Lymphedema     Medications:  Scheduled:  . apixaban  2.5 mg Oral BID  . atorvastatin  80 mg Oral QHS  . calcitRIOL  0.25 mcg Oral Q M,W,F  . famotidine (PEPCID) IV  10 mg Intravenous Q12H  . furosemide  40 mg Oral Daily  . gabapentin  300 mg Oral BID  . insulin aspart  0-5 Units Subcutaneous QHS  . insulin aspart  0-9 Units Subcutaneous TID WC  . insulin glargine  50 Units Subcutaneous QHS  . latanoprost  1 drop Both Eyes QHS  . levothyroxine  137 mcg Oral QAC breakfast  . levothyroxine  137 mcg Oral Q Fri  . metoprolol tartrate  12.5 mg Oral BID  . mometasone-formoterol  2 puff  Inhalation BID  . montelukast  10 mg Oral QHS  . senna  1 tablet Oral Daily  . sodium chloride flush  3 mL Intravenous Q12H  . venlafaxine  75 mg Oral BID   Infusions:    Assessment: Pharmacy consulted to dose apixaban in an 82 yo female admitted with NSTEMI.  Patient takes apixaban 2.5 mg po BID chronically as outpatient due to atrial fibrillation.   Anticoagulation withheld previously due to GIB.  However, MD wishes to restart today.   Weight: 112.9 kg, Baseline SCr 1.5-1.7 (chronic CKD per notes)  Goal of Therapy:  SCr and CBC per policy Hemoglobin in AM   Plan:  Will restart patient on apixaban 2.5 mg po BID.  Pharmacy will continue to follow/monitor.  Bryten Maher G 12/19/2015,9:10 AM

## 2015-12-19 NOTE — Progress Notes (Signed)
orthopedic foot brace placed left foot as per MD order. Will continue to monitor,

## 2015-12-19 NOTE — Progress Notes (Signed)
[facility] NAME: Jill York    MR#:  [id number]  DATE OF BIRTH:  1930/11/11  SUBJECTIVE:  CHIEF COMPLAINT:   Chief Complaint  Patient presents with  . Weakness  Patient is 82 year old female admitted with GI bleeding. NO further sign of bleeding this morning. Pt compains of left foot pain after a fall. Was having visual hallucinations at night but did not report any overnight.   Review of Systems  Unable to perform ROS: other  No Chest, No Shortness of breath  VITAL SIGNS: Blood pressure 99/59, pulse 78, temperature 98.1 F (36.7 C), temperature source Oral, resp. rate 18, height 5\' 4"  (1.626 m), weight 112.946 kg (249 lb), SpO2 100 %.  PHYSICAL EXAMINATION:   GENERAL:  82 y.o.-year-old patient lying in the bed with no acute distress, EYES: Pupils equal, round, reactive to light and accommodation. No scleral icterus. Extraocular muscles intact.  HEENT: Head atraumatic, normocephalic. Oropharynx and nasopharynx clear.  NECK:  Supple, no jugular venous distention. No thyroid enlargement, no tenderness.  LUNGS: Normal breath sounds bilaterally, no wheezing, rales,rhonchi or crepitation. No use of accessory muscles of respiration.  CARDIOVASCULAR: Irregular.No murmurs, rubs, or gallops.  ABDOMEN: Soft, nontender, nondistended. Bowel sounds present. No organomegaly or mass.  EXTREMITIES: 1-2+ lower extremity and pedal edema, no cyanosis, or clubbing. Left ankle tender in medial aspect and with extension.  NEUROLOGIC: Cranial nerves II through XII are grossly intact  Muscle strength 5 out of 5 in all extremities. Sensation intact to light touch. Gait not checked.  PSYCHIATRIC: The patient is some agitated, pressured speech SKIN: No obvious rash, lesion, or ulcer.   ORDERS/RESULTS REVIEWED:   CBC  Recent Labs Lab 12/15/15 0828 12/16/15 0439 12/17/15 0535 12/18/15 0604  WBC 8.2  --   --   --   HGB 8.4* 9.1* 8.8* 8.5*   HCT 26.1*  --   --   --   PLT 175  --   --   --   MCV 96.0  --   --   --   MCH 31.0  --   --   --   MCHC 32.3  --   --   --   RDW 19.0*  --   --   --   LYMPHSABS 1.5  --   --   --   MONOABS 0.4  --   --   --   EOSABS 0.1  --   --   --   BASOSABS 0.0  --   --   --    ------------------------------------------------------------------------------------------------------------------  Chemistries   Recent Labs Lab 12/15/15 0828 12/15/15 1225 12/17/15 0535  NA 140  --  142  K 4.4  --  4.1  CL 105  --  106  CO2 25  --  28  GLUCOSE 105*  --  253*  BUN 49*  --  48*  CREATININE 2.10*  --  1.70*  CALCIUM 8.7*  --  8.6*  MG  --  2.3  --   AST 34  --   --   ALT 30  --   --   ALKPHOS 77  --   --   BILITOT 0.7  --   --    ------------------------------------------------------------------------------------------------------------------ estimated creatinine clearance is 30.9 mL/min (by C-G formula based on Cr of 1.7). ------------------------------------------------------------------------------------------------------------------ No results for input(s): TSH, T4TOTAL, T3FREE, THYROIDAB in the last 72 hours.  Invalid input(s): FREET3  Cardiac Enzymes  Recent Labs Lab 12/15/15 0828 12/15/15 1349 12/15/15 2007  TROPONINI 0.77* 0.65* 0.45*   ------------------------------------------------------------------------------------------------------------------ Invalid input(s): POCBNP ---------------------------------------------------------------------------------------------------------------  RADIOLOGY: No results found.  EKG:  Orders placed or performed during the hospital encounter of 12/15/15  . ED EKG  . ED EKG    ASSESSMENT AND PLAN:  Principal Problem:   Episodes of formed visual hallucinations Active Problems:   NSTEMI (non-ST elevated myocardial infarction) (HCC)   Acute blood loss anemia   Hematochezia #1. Acute on chronic posthemorrhagic symptomatic anemia,  status post 1 unit of packed red blood cell transfusion, hemoglobin level is stable at 8.5  today , Repeat Hgb in am. #2. Elevated troponin due to demand ischemia, no further diagnostics per cardiologist. Continue Lipitor and metoprolol, not on aspirin due to GI bleed #3. Gastrointestinal bleed due to unclear etiology at present, status post EGD and colonoscopy within within 1 year, which showed only polyps in colon, removed, s/p  clip placement , no further diagnostics were recommended by gastroenterologist as long as bleeding subsidesHgb stable off anticoagulation. Will resart Eliquis today and monitor HgB. #4. Acute on chronic renal failure, status post transfusion, creatinine has improved, which is her baseline at 1.5-1.7. #5 . Hallucinations, Evaluated by psych. Rec not using narcotics unless absolutely needed. #6. Right shoulder pain, no injury on x-ray #7. Left ankle pain s/p fall: X-ray pending. #8 fall, generalized weakness, physical therapist recommended skilled nursing facility placement #89 History of obstructive sleep apnea, mild chronic CO2 retention, respiratory therapist was recommended to decrease oxygen flow and keep pulse oximetry at 88-92%, not higher.    Marland Kitchen   DRUG ALLERGIES:  Allergies  Allergen Reactions  . Amlodipine Swelling    Leg swelling only   . Oxycodone Other (See Comments)    Reaction: patient states she was "out" for 2 hours and did not notice people in the area    . Demerol [Meperidine] Diarrhea and Nausea And Vomiting  . Lisinopril Cough  . Tape Rash    CODE STATUS:     Code Status Orders        Start     Ordered   12/15/15 1200  Do not attempt resuscitation (DNR)   Continuous    Question Answer Comment  In the event of cardiac or respiratory ARREST Do not call a "code blue"   In the event of cardiac or respiratory ARREST Do not perform Intubation, CPR, defibrillation or ACLS   In the event of cardiac or respiratory ARREST Use medication by any  route, position, wound care, and other measures to relive pain and suffering. May use oxygen, suction and manual treatment of airway obstruction as needed for comfort.      12/15/15 1159    Code Status History    Date Active Date Inactive Code Status Order ID Comments User Context   01/01/2015 10:29 AM 01/04/2015  1:43 PM DNR [id number]  Loletha Grayer, MD ED    Advance Directive Documentation        Most Recent Value   Type of Advance Directive  Healthcare Power of Attorney   Pre-existing out of facility DNR order (yellow form or pink MOST form)     "MOST" Form in Place?        TOTAL TIME TAKING CARE OF THIS PATIENT: 35 minutes.  Discussed with patient's daughter MelodyAgain today, all questions were answered  Dana Allan.D on 12/19/2015 at 9:00 AM  Between 7am to 6pm - Pager - [phone]  After  6pm go to [email or web address] - password [email or web address] [facility]  Office  [phone]  CC: Primary care physician; Sharyne Peach, MD

## 2015-12-19 NOTE — Progress Notes (Signed)
Received report from radiology, CX ray left ankle fracture, Dr Edwina Barth notified,

## 2015-12-19 NOTE — Consult Note (Signed)
[facility] Face-to-Face Psychiatry Consult   Reason for Consult:  Consult for this 82 year old woman without any past significant psychiatric problems. Concern about visual hallucinations. Referring Physician:  Ether Griffins Patient Identification: Jill York MRN:  [id number] Principal Diagnosis: Episodes of formed visual hallucinations Diagnosis:   Patient Active Problem List   Diagnosis Date Noted  . Episodes of formed visual hallucinations [H53.16] 12/18/2015  . NSTEMI (non-ST elevated myocardial infarction) ([place]) [I21.4] 12/15/2015  . Acute blood loss anemia [D62]   . Hematochezia [K92.1]   . Acute respiratory failure with hypoxia ([place]) [J96.01] 01/01/2015  . Chronic diastolic heart failure (HCC) [I50.32] 11/01/2014    Total Time spent with patient: 20 minutes  Subjective:   Jill York is a 82 y.o. female patient admitted with "I see a chubby little African-American woman".  Follow-up for Tuesday the 27th. Patient was seen today. Chart reviewed. Her daughter was also present. Patient reports that she didn't sleep very well last night. She did continue to have visual hallucinations. Saw a woman sitting on the end of her bed. Woke up this morning and thought that the tree outside her window was an Army man pointing a gun at her. Although she got confused she was easily redirected and it doesn't sound like she panicked over it. At this point she is not reporting any delusions and she is calm. No mood symptoms.  HPI:  Patient interviewed. Chart reviewed. Labs and vitals reviewed. Consult because of persistent hallucinations. Patient states that her hallucinations primarily happen at night. She doesn't have an awareness that they are happening during the daytime. She says the most common thing she will see is a chubby little African-American woman sitting crosslegged at the end of her bed. This is actually somebody the patient knew from years ago. Patient says it usually happens at night and  that it does not make her emotionally distressed. She says it actually makes her feel sort of happy as this was someone who was a friend of hers. She also says she will sometimes see visions of her mother. Her description of this is a HEARSE going by. She still says that it does not make her emotionally upset. Patient does not have any complaints of psychiatric symptoms in the daytime. She denies feeling depressed although she says that it has been hard adjusting to her husband passing away 3 years ago. She still has positive things that she enjoys during the day. She admits she has some difficulty sleeping much of which is due to her medical problems. The need to go to the bathroom wakes her up at night as does her joint pain. Patient absolutely denies any suicidal thoughts or wish to die. She does not show evidence of any paranoid delusions that I can identify. Patient denies any use of alcohol or drugs. Obviously the idea has arisen among the patient and her family that these symptoms could be caused by narcotics. I agreed with her that narcotics were a common cause of visual hallucinations but that it doesn't look like she is actually on any narcotics right now and it's not clear to me whether she was before coming into the hospital.  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 Living independently. Daughter lives nearby. Good relationship with her adult children and family. Reports that she still has friends living that she sees regularly. Morning her husband from 3 years ago but seems to actually have a pretty good quality of life from her description.  Medical history: Status post knee  replacement on the left side. Now with painful arthritis on the right side which is evidently judged to be nonoperable. History of diastolic heart failure respiratory failure history of a myocardial infarction. Overweight.  Substance abuse history: Patient denies that she's ever had any sort of alcohol problem. She  describes herself as "allergic" to alcohol because she had a bad reaction to it the first time she tried any when she was in her 66s. Never had any kind of drug problem.  Past Psychiatric History: She says that many years ago probably about 30 or 40 years ago she was having spells of anxiety. She was referred to see a psychiatrist at [facility]. She briefly took Xanax but didn't stay on it for very long. It sounds like she was also referred for some relaxation therapy. Never had psychiatric hospitalization never had psychiatric treatment other than described above. No history of suicide or psychotic disorder.  Risk to Self: Is patient at risk for suicide?: No Risk to Others:   Prior Inpatient Therapy:   Prior Outpatient Therapy:    Past Medical History:  Past Medical History  Diagnosis Date  . CHF (congestive heart failure) ([place])   . Thyroid disease   . Diabetes mellitus without complication ([place])   . Hypertension   . Hyperlipidemia   . Arrhythmia     PVC  . COPD (chronic obstructive pulmonary disease) ([place])   . Chronic kidney disease   . OSA (obstructive sleep apnea) 2011  . Anemia   . Anxiety and depression     [name]  . Lymphoma ([place])   . Osteoarthritis   . Lymphedema     Past Surgical History  Procedure Laterality Date  . Tonsillectomy and adenoidectomy    . Appendectomy    . Knee surgery Left   . Abdominal hysterectomy    . Cardiac catheterization    . Coronary angioplasty      x4  . Cardiac stents     Family History:  Family History  Problem Relation Age of Onset  . Aneurysm Mother   . Stroke Mother   . Liver cancer Father    Family Psychiatric  History: She had an older sister who had an alcohol problem and that is the only family history she knows of. Social History:  History  Alcohol Use No     History  Drug Use No    Social History   Social History  . Marital Status: Widowed    Spouse Name: N/A  . Number of Children: N/A    . Years of Education: N/A   Social History Main Topics  . Smoking status: Former Smoker -- 1.50 packs/day for 18 years    Quit date: 06/25/1975  . Smokeless tobacco: Never Used  . Alcohol Use: No  . Drug Use: No  . Sexual Activity: Not Asked   Other Topics Concern  . None   Social History Narrative   Additional Social History:    Allergies:   Allergies  Allergen Reactions  . Amlodipine Swelling    Leg swelling only   . Oxycodone Other (See Comments)    Reaction: patient states she was "out" for 2 hours and did not notice people in the area    . Demerol [Meperidine] Diarrhea and Nausea And Vomiting  . Lisinopril Cough  . Tape Rash    Labs:  Results for orders placed or performed during the hospital encounter of 12/15/15 (from the past 48 hour(s))  Glucose, capillary     Status: Abnormal   Collection Time: 12/17/15  8:45 PM  Result Value Ref Range   Glucose-Capillary 156 (H) 65 - 99 mg/dL  Hemoglobin     Status: Abnormal   Collection Time: 12/18/15  6:04 AM  Result Value Ref Range   Hemoglobin 8.5 (L) 12.0 - 16.0 g/dL  Glucose, capillary     Status: Abnormal   Collection Time: 12/18/15  7:19 AM  Result Value Ref Range   Glucose-Capillary 207 (H) 65 - 99 mg/dL  Ammonia     Status: None   Collection Time: 12/18/15  8:20 AM  Result Value Ref Range   Ammonia 20 9 - 35 umol/L  Glucose, capillary     Status: Abnormal   Collection Time: 12/18/15 11:05 AM  Result Value Ref Range   Glucose-Capillary 125 (H) 65 - 99 mg/dL  Glucose, capillary     Status: Abnormal   Collection Time: 12/18/15  4:46 PM  Result Value Ref Range   Glucose-Capillary 165 (H) 65 - 99 mg/dL  Glucose, capillary     Status: Abnormal   Collection Time: 12/18/15  9:05 PM  Result Value Ref Range   Glucose-Capillary 196 (H) 65 - 99 mg/dL  Glucose, capillary     Status: Abnormal   Collection Time: 12/19/15  7:24 AM  Result Value Ref Range   Glucose-Capillary 208 (H) 65 - 99 mg/dL   Comment 1  Notify RN   Glucose, capillary     Status: Abnormal   Collection Time: 12/19/15 11:50 AM  Result Value Ref Range   Glucose-Capillary 228 (H) 65 - 99 mg/dL   Comment 1 Notify RN   Glucose, capillary     Status: Abnormal   Collection Time: 12/19/15  4:29 PM  Result Value Ref Range   Glucose-Capillary 207 (H) 65 - 99 mg/dL   Comment 1 Notify RN     Current Facility-Administered Medications  Medication Dose Route Frequency Provider Last Rate Last Dose  . 0.9 %  sodium chloride infusion  250 mL Intravenous PRN Demetrios Loll, MD      . acetaminophen (TYLENOL) tablet 650 mg  650 mg Oral Q4H PRN Demetrios Loll, MD   650 mg at 12/19/15 1650  . albuterol (PROVENTIL) (2.5 MG/3ML) 0.083% nebulizer solution 2.5 mg  2.5 mg Nebulization Q6H PRN Demetrios Loll, MD      . apixaban Arne Cleveland) tablet 2.5 mg  2.5 mg Oral BID Loleta Dicker, RPH   2.5 mg at 12/19/15 0928  . atorvastatin (LIPITOR) tablet 80 mg  80 mg Oral QHS Demetrios Loll, MD   80 mg at 12/18/15 2151  . baclofen (LIORESAL) tablet 5 mg  5 mg Oral QHS PRN Saundra Shelling, MD   5 mg at 12/16/15 2148  . bisacodyl (DULCOLAX) EC tablet 5 mg  5 mg Oral Daily PRN Demetrios Loll, MD      . calcitRIOL (ROCALTROL) capsule 0.25 mcg  0.25 mcg Oral Q M,W,F Demetrios Loll, MD   0.25 mcg at 12/18/15 1103  . docusate sodium (COLACE) capsule 100 mg  100 mg Oral Daily PRN Demetrios Loll, MD      . famotidine (PEPCID) 10 mg in sodium chloride 0.9 % 25 mL  10 mg Intravenous Q12H Sheema M Hallaji, RPH   10 mg at 12/19/15 1110  . fluticasone (FLONASE) 50 MCG/ACT nasal spray 1 spray  1 spray Each Nare Daily PRN Demetrios Loll, MD   1 spray at 12/16/15 2152  .  furosemide (LASIX) tablet 40 mg  40 mg Oral Daily Theodoro Grist, MD   40 mg at 12/19/15 0923  . gabapentin (NEURONTIN) capsule 300 mg  300 mg Oral BID Demetrios Loll, MD   300 mg at 12/19/15 [id number]  . insulin aspart (novoLOG) injection 0-5 Units  0-5 Units Subcutaneous QHS Demetrios Loll, MD   2 Units at 12/16/15 2149  . insulin aspart (novoLOG) injection  0-9 Units  0-9 Units Subcutaneous TID WC Demetrios Loll, MD   3 Units at 12/19/15 1649  . insulin glargine (LANTUS) injection 50 Units  50 Units Subcutaneous QHS Demetrios Loll, MD   50 Units at 12/18/15 2152  . latanoprost (XALATAN) 0.005 % ophthalmic solution 1 drop  1 drop Both Eyes QHS Demetrios Loll, MD   1 drop at 12/18/15 2151  . levothyroxine (SYNTHROID, LEVOTHROID) tablet 137 mcg  137 mcg Oral QAC breakfast Demetrios Loll, MD   137 mcg at 12/19/15 [phone]  . levothyroxine (SYNTHROID, LEVOTHROID) tablet 137 mcg  137 mcg Oral Q Joeseph Amor, MD   137 mcg at 12/15/15 1322  . metoprolol tartrate (LOPRESSOR) tablet 12.5 mg  12.5 mg Oral BID Demetrios Loll, MD   12.5 mg at 12/18/15 2152  . mometasone-formoterol (DULERA) 200-5 MCG/ACT inhaler 2 puff  2 puff Inhalation BID Demetrios Loll, MD   2 puff at 12/19/15 0825  . montelukast (SINGULAIR) tablet 10 mg  10 mg Oral QHS Demetrios Loll, MD   10 mg at 12/18/15 2151  . nitroGLYCERIN (NITROSTAT) SL tablet 0.4 mg  0.4 mg Sublingual Q5 min PRN Demetrios Loll, MD      . ondansetron [facility]) injection 4 mg  4 mg Intravenous Q6H PRN Demetrios Loll, MD   4 mg at 12/17/15 1210  . polyethylene glycol (MIRALAX / GLYCOLAX) packet 17 g  17 g Oral Daily PRN Demetrios Loll, MD      . senna [facility]) tablet 8.6 mg  1 tablet Oral Daily Theodoro Grist, MD   8.6 mg at 12/19/15 0923  . senna-docusate (Senokot-S) tablet 2 tablet  2 tablet Oral BID PRN Demetrios Loll, MD      . sodium chloride flush (NS) 0.9 % injection 3 mL  3 mL Intravenous Q12H Demetrios Loll, MD   3 mL at 12/19/15 0924  . sodium chloride flush (NS) 0.9 % injection 3 mL  3 mL Intravenous PRN Demetrios Loll, MD      . traZODone (DESYREL) tablet 50 mg  50 mg Oral QHS PRN Gonzella Lex, MD      . venlafaxine [facility]) tablet 75 mg  75 mg Oral BID Demetrios Loll, MD   75 mg at 12/19/15 [id number]    Musculoskeletal: Strength & Muscle Tone: decreased Gait & Station: unable to stand Patient leans: N/A  Psychiatric Specialty Exam: Physical Exam  Nursing note and vitals  reviewed. Constitutional: She appears well-developed and well-nourished.  HENT:  Head: Normocephalic and atraumatic.  Eyes: Conjunctivae are normal. Pupils are equal, round, and reactive to light.  Neck: Normal range of motion.  Cardiovascular: Normal heart sounds.   Respiratory: Effort normal. No respiratory distress.    GI: Soft.  Musculoskeletal: Normal range of motion.       Legs: Neurological: She is alert.  Skin: Skin is warm and dry.  Psychiatric: She has a normal mood and affect. Her behavior is normal. Judgment and thought content normal. Her speech is tangential. Cognition and memory are normal.    Review of Systems  Constitutional: Negative.  HENT: Negative.   Eyes: Negative.   Respiratory: Negative.   Cardiovascular: Negative.   Gastrointestinal: Negative.   Musculoskeletal: Negative.   Skin: Negative.   Neurological: Negative.   Psychiatric/Behavioral: Positive for hallucinations. Negative for depression, suicidal ideas, memory loss and substance abuse. The patient is not nervous/anxious and does not have insomnia.     Blood pressure 139/60, pulse 79, temperature 97.8 F (36.6 C), temperature source Oral, resp. rate 21, height 5\' 4"  (1.626 m), weight 112.946 kg (249 lb), SpO2 95 %.Body mass index is 42.72 kg/(m^2).  General Appearance: Fairly Groomed  Eye Contact:  Good  Speech:  Clear and Coherent  Volume:  Normal  Mood:  Euthymic  Affect:  Appropriate  Thought Process:  Disorganized and Descriptions of Associations: Tangential  Orientation:  Full (Time, Place, and Person)  Thought Content:  Hallucinations: Visual and Tangential  Suicidal Thoughts:  No  Homicidal Thoughts:  No  Memory:  Immediate;   Good Recent;   Good Remote;   Good  Judgement:  Fair  Insight:  Fair  Psychomotor Activity:  Decreased  Concentration:  Concentration: Fair  Recall:  [place] of Knowledge:  Fair  Language:  Fair  Akathisia:  No  Handed:  Right  AIMS (if indicated):      Assets:  Communication Skills Desire for Improvement Housing Resilience Social Support  ADL's:  Impaired  Cognition:  WNL  Sleep:        Treatment Plan Summary: Plan I repeated my recommendation to the patient and her daughter that I would not start antipsychotic medicines for these kind of symptoms. I think the risks would outweigh the potential benefits. Patient is not getting overly agitated or psychotic from her symptoms. We discussed how narcotic pain medicines may be part of the problem but also acknowledged that she has a broken bone and may need some stronger pain medicine. As far as her sleep I suggested we try trazodone 50 mg as needed for sleep tonight which she agreed to. I will follow up tomorrow.  Disposition: Patient does not meet criteria for psychiatric inpatient admission. Supportive therapy provided about ongoing stressors.  Alethia Berthold, MD 12/19/2015 6:29 PM

## 2015-12-19 NOTE — Progress Notes (Signed)
Physical Therapy Treatment Patient Details Name: Jill York MRN: [id number] DOB: 1930/06/26 Today's Date: 12/19/2015    History of Present Illness 82 yo F presented to ED on 6/23 after a fall and increased weakness. She was admitted for possibly having had an NSTEMI. PMH includes coronary angioplasty x4, COPD, CKD, CHF, and DM.    PT Comments    Pt continued to have difficulty with mobility this session. She had imaging of her L ankle today due to c/o pain and was found to have a fibula fracture and is now on PWB precautions for L LE. She continues to require mod A for bed mobility. Transfers with FWW require mod A +2. Pt tends to lean posteriorly in standing and requires physical assistance and cues for correction. Pt educated on PWB of L LE and how to maintain during transfers using FWW. Ambulation not attempted this session due to difficulty with transfers. Plan to progress mobility as tolerated.  Follow Up Recommendations  SNF     Equipment Recommendations  None recommended by PT (pt has recommended FWW)    Recommendations for Other Services       Precautions / Restrictions Precautions Precautions: Fall Restrictions Weight Bearing Restrictions: Yes LLE Weight Bearing: Partial weight bearing    Mobility  Bed Mobility Overal bed mobility: Needs Assistance Bed Mobility: Supine to Sit     Supine to sit: Mod assist;HOB elevated     General bed mobility comments: difficulty getting trunk upright  Transfers Overall transfer level: Needs assistance Equipment used: Rolling walker (2 wheeled) Transfers: Sit to/from [facility] Sit to Stand: Mod assist;+2 safety/equipment Stand pivot transfers: Mod assist;+2 safety/equipment       General transfer comment: cues for hand placement, anterior weight shifting and keeping FWW on the floor, difficulty taking steps, c/o L ankle pain  Ambulation/Gait             General Gait Details: Nt this  session   Stairs            Wheelchair Mobility    Modified Rankin (Stroke Patients Only)       Balance Overall balance assessment: Needs assistance;History of Falls Sitting-balance support: Bilateral upper extremity supported;Feet supported Sitting balance-Leahy Scale: Fair Sitting balance - Comments: maintains static balance   Standing balance support: Bilateral upper extremity supported Standing balance-Leahy Scale: Poor Standing balance comment: requires mod A to maintain balance                    Cognition Arousal/Alertness: Awake/alert Behavior During Therapy: WFL for tasks assessed/performed Overall Cognitive Status: Within Functional Limits for tasks assessed                      Exercises Other Exercises Other Exercises: Multiple stand pivot transfer to/from bed/BSC and to/from BSC/recliner with FWW and mod A +2. Cues for PWB technique for L LE, staying close to [facility] and standing upright to improve balance. Increased time required to complete due to level of difficulty.     General Comments General comments (skin integrity, edema, etc.): L LE boot due to fibula fracture      Pertinent Vitals/Pain Pain Assessment: 0-10 Pain Score: 7  Pain Location: L ankle Pain Descriptors / Indicators: Aching Pain Intervention(s): Limited activity within patient's tolerance;Monitored during session;Repositioned    Home Living                      Prior Function  PT Goals (current goals can now be found in the care plan section) Acute Rehab PT Goals Patient Stated Goal: to get stronger PT Goal Formulation: With patient Time For Goal Achievement: 01/01/16 Potential to Achieve Goals: Fair Progress towards PT goals: Progressing toward goals    Frequency  Min 2X/week    PT Plan Current plan remains appropriate    Co-evaluation             End of Session Equipment Utilized During Treatment: Gait belt;Oxygen Activity  Tolerance: Patient limited by fatigue;Patient limited by pain Patient left: in chair;with call bell/phone within reach;with chair alarm set     Time: [id number] PT Time Calculation (min) (ACUTE ONLY): 26 min  Charges:  $Therapeutic Activity: 23-37 mins                    G Codes:      Neoma Laming, PT, DPT  12/19/2015, 4:51 PM [phone]

## 2015-12-19 NOTE — Consult Note (Signed)
ORTHOPAEDIC CONSULTATION  REQUESTING PHYSICIAN: Baxter Hire, MD  Chief Complaint: Left ankle pain  HPI: Jill York is a 82 y.o. female who complains of  left ankle pain following a fall at home yesterday when trying to go to the bathroom.  She was admitted to [facility] for medical problems and x-rays done today show a fracture of the distal fibula on the left.  This is nondisplaced.  Ankle mortise is normal.  We will recommend treatment with an ankle-foot orthosis and protected weightbearing with PT.  Past Medical History  Diagnosis Date  . CHF (congestive heart failure) ([place])   . Thyroid disease   . Diabetes mellitus without complication ([place])   . Hypertension   . Hyperlipidemia   . Arrhythmia     PVC  . COPD (chronic obstructive pulmonary disease) ([place])   . Chronic kidney disease   . OSA (obstructive sleep apnea) 2011  . Anemia   . Anxiety and depression     [name]  . Lymphoma ([place])   . Osteoarthritis   . Lymphedema    Past Surgical History  Procedure Laterality Date  . Tonsillectomy and adenoidectomy    . Appendectomy    . Knee surgery Left   . Abdominal hysterectomy    . Cardiac catheterization    . Coronary angioplasty      x4  . Cardiac stents     Social History   Social History  . Marital Status: Widowed    Spouse Name: N/A  . Number of Children: N/A  . Years of Education: N/A   Social History Main Topics  . Smoking status: Former Smoker -- 1.50 packs/day for 18 years    Quit date: 06/25/1975  . Smokeless tobacco: Never Used  . Alcohol Use: No  . Drug Use: No  . Sexual Activity: Not Asked   Other Topics Concern  . None   Social History Narrative   Family History  Problem Relation Age of Onset  . Aneurysm Mother   . Stroke Mother   . Liver cancer Father    Allergies  Allergen Reactions  . Amlodipine Swelling    Leg swelling only   . Oxycodone Other (See Comments)    Reaction:  patient states she was "out" for 2 hours and did not notice people in the area    . Demerol [Meperidine] Diarrhea and Nausea And Vomiting  . Lisinopril Cough  . Tape Rash   Prior to Admission medications   Medication Sig Start Date End Date Taking? Authorizing Provider  acetaminophen (TYLENOL) 500 MG tablet Take 1,000 mg by mouth every 6 (six) hours as needed.   Yes Historical Provider, MD  albuterol (PROVENTIL HFA;VENTOLIN HFA) 108 (90 BASE) MCG/ACT inhaler Inhale 1 puff into the lungs every 4 (four) hours as needed for wheezing or shortness of breath.    Yes Historical Provider, MD  albuterol (PROVENTIL) (2.5 MG/3ML) 0.083% nebulizer solution Take 2.5 mg by nebulization every 6 (six) hours as needed for wheezing or shortness of breath.   Yes Historical Provider, MD  apixaban (ELIQUIS) 2.5 MG TABS tablet Take 2.5 mg by mouth 2 (two) times daily.   Yes Historical Provider, MD  aspirin EC 81 MG tablet Take 81 mg by mouth daily.   Yes Historical Provider, MD  atorvastatin (LIPITOR) 80 MG tablet Take 80 mg by mouth daily.   Yes Historical Provider, MD  baclofen (LIORESAL) 10 MG tablet Take 5 mg  by mouth at bedtime as needed for muscle spasms.   Yes Historical Provider, MD  bisacodyl (DULCOLAX) 5 MG EC tablet Take 5 mg by mouth daily as needed for moderate constipation.   Yes Historical Provider, MD  calcitRIOL (ROCALTROL) 0.25 MCG capsule Take 0.25 mcg by mouth every Monday, Wednesday, and Friday.   Yes Historical Provider, MD  diclofenac sodium (VOLTAREN) 1 % GEL Apply 2 g topically 2 (two) times daily.   Yes Historical Provider, MD  docusate sodium (COLACE) 100 MG capsule Take 100 mg by mouth daily as needed for mild constipation or moderate constipation.    Yes Historical Provider, MD  esomeprazole (NEXIUM) 20 MG capsule Take 20 mg by mouth daily as needed (for acid reflux).   Yes Historical Provider, MD  fluticasone (FLONASE) 50 MCG/ACT nasal spray Place 1 spray into both nostrils daily.   Yes  Historical Provider, MD  Fluticasone-Salmeterol (ADVAIR) 250-50 MCG/DOSE AEPB Inhale 1 puff into the lungs 2 (two) times daily.   Yes Historical Provider, MD  furosemide (LASIX) 40 MG tablet Take 40 mg by mouth daily.   Yes Historical Provider, MD  gabapentin (NEURONTIN) 300 MG capsule Take 300 mg by mouth 2 (two) times daily.   Yes Historical Provider, MD  insulin glargine (LANTUS) 100 UNIT/ML injection Inject 50 Units into the skin at bedtime.    Yes Historical Provider, MD  insulin lispro (HUMALOG) 100 UNIT/ML injection Inject 20-26 Units into the skin 3 (three) times daily before meals. Inject 20 units sub-q in morning, inject 26 units sub-q in afternoon, and inject 20 units sub-q in evening   Yes Historical Provider, MD  latanoprost (XALATAN) 0.005 % ophthalmic solution Place 1 drop into both eyes at bedtime.   Yes Historical Provider, MD  levothyroxine (SYNTHROID, LEVOTHROID) 137 MCG tablet Take 137-274 mcg by mouth daily before breakfast. Take 184mcg (1 tablet) daily on Mon, Tues, Wed, Thurs, Sat, and Sun. Take 265mcg (2 tabs) daily on Friday. (Take with a glass of water at least 30 to 60 minutes before breakfast)   Yes Historical Provider, MD  losartan (COZAAR) 25 MG tablet Take 12.5 mg by mouth 2 (two) times daily.   Yes Historical Provider, MD  metoprolol tartrate (LOPRESSOR) 25 MG tablet Take 0.5 tablets (12.5 mg total) by mouth 2 (two) times daily. 01/03/15  Yes Richard Leslye Peer, MD  montelukast (SINGULAIR) 10 MG tablet Take 10 mg by mouth at bedtime.   Yes Historical Provider, MD  nitroGLYCERIN (NITROSTAT) 0.4 MG SL tablet Place 0.4 mg under the tongue every 5 (five) minutes as needed for chest pain.   Yes Historical Provider, MD  nystatin-triamcinolone (MYCOLOG II) cream Apply 1 application topically 2 (two) times daily.   Yes Historical Provider, MD  polyethylene glycol (MIRALAX / GLYCOLAX) packet Take 17 g by mouth daily as needed.   Yes Historical Provider, MD  senna-docusate (SENOKOT-S)  8.6-50 MG tablet Take 2 tablets by mouth 2 (two) times daily as needed for mild constipation.   Yes Historical Provider, MD  tetrahydrozoline 0.05 % ophthalmic solution Place 1 drop into both eyes daily as needed.   Yes Historical Provider, MD  venlafaxine (EFFEXOR) 75 MG tablet Take 75 mg by mouth 2 (two) times daily.   Yes Historical Provider, MD  amLODipine (NORVASC) 5 MG tablet Take 5 mg by mouth daily.    Historical Provider, MD  Calcium Carbonate-Vitamin D 600-400 MG-UNIT per tablet Take 1 tablet by mouth 2 (two) times daily with a meal. 11/25/14 11/25/15  Historical Provider, MD  Cholecalciferol 1000 UNITS tablet Take 1,000 Units by mouth daily.    Historical Provider, MD   Dg Foot Complete Left  12/19/2015  CLINICAL DATA:  Patient fell yesterday while getting out of bed, previous recent fall, pain in lower leg and throughout left ankle, tender to touch, difficult to obtain images- tech held for positioning EXAM: LEFT FOOT - COMPLETE 3+ VIEW COMPARISON:  None. FINDINGS: There is and oblique comminuted fracture of the lateral malleolus. There are plantar and Achilles spurs of the calcaneus. Vascular calcifications are present. IMPRESSION: Comminuted fracture of the lateral malleolus. Consider dedicated views of the ankle. These results will be called to the ordering clinician or representative by the Radiologist Assistant, and communication documented in the PACS or zVision Dashboard. Electronically Signed   By: Nolon Nations M.D.   On: 12/19/2015 09:34    Positive ROS: All other systems have been reviewed and were otherwise negative with the exception of those mentioned in the HPI and as above.  Physical Exam: General: Alert, no acute distress Cardiovascular: No pedal edema Respiratory: No cyanosis, no use of accessory musculature GI: No organomegaly, abdomen is soft and non-tender Skin: No lesions in the area of chief complaint Neurologic: Sensation intact distally Psychiatric: Patient is  competent for consent with normal mood and affect Lymphatic: No axillary or cervical lymphadenopathy  MUSCULOSKELETAL: Tender left distal fibula without significant swelling or bruising.  The ankle mortise joint is normal.  There is good motion.  Neurovascular status is good.  No other orthopedic injuries are noted.  Assessment: Nondisplaced left distal fibular fracture  Plan: Ace bandage.  An ankle-foot orthosis. PT for protected weightbearing left leg. We'll probably need rehabilitation stay. Return to my office in 2 weeks for exam and x-ray.    Park Breed, MD [phone]   12/19/2015 12:31 PM

## 2015-12-20 LAB — GLUCOSE, CAPILLARY
GLUCOSE-CAPILLARY: 263 mg/dL — AB (ref 65–99)
Glucose-Capillary: 226 mg/dL — ABNORMAL HIGH (ref 65–99)

## 2015-12-20 LAB — HEMOGLOBIN
HEMOGLOBIN: 8.2 g/dL — AB (ref 12.0–16.0)
Hemoglobin: 7.9 g/dL — ABNORMAL LOW (ref 12.0–16.0)

## 2015-12-20 MED ORDER — FAMOTIDINE 20 MG PO TABS: 10.0000 mg | ORAL_TABLET | Freq: Two times a day (BID) | ORAL | Status: DC

## 2015-12-20 MED ORDER — TRAMADOL HCL 50 MG PO TABS
50.0000 mg | ORAL_TABLET | Freq: Once | ORAL | Status: AC
  Administered 2015-12-20: 50 mg via ORAL
  Filled 2015-12-20: qty 1

## 2015-12-20 NOTE — Clinical Social Work Note (Signed)
Patient discharging today to [facility]. Seth Bake at [facility] is aware and discharge information has been sent. Nurse to call report. Patient and her daughter are aware and patient requests transport via EMS. Shela Leff MSW,LCSW [phone]

## 2015-12-20 NOTE — Consult Note (Signed)
[facility] Face-to-Face Psychiatry Consult   Reason for Consult:  Consult for this 82 year old woman without any past significant psychiatric problems. Concern about visual hallucinations. Referring Physician:  Ether Griffins Patient Identification: Jill York MRN:  [id number] Principal Diagnosis: Episodes of formed visual hallucinations Diagnosis:   Patient Active Problem List   Diagnosis Date Noted  . Episodes of formed visual hallucinations [H53.16] 12/18/2015  . NSTEMI (non-ST elevated myocardial infarction) ([place]) [I21.4] 12/15/2015  . Acute blood loss anemia [D62]   . Hematochezia [K92.1]   . Acute respiratory failure with hypoxia ([place]) [J96.01] 01/01/2015  . Chronic diastolic heart failure (HCC) [I50.32] 11/01/2014    Total Time spent with patient: 20 minutes  Subjective:   Jill York is a 82 y.o. female patient admitted with "I see a chubby little African-American woman".  Follow-up Wednesday the 28th. Patient has no new complaints psychiatrically. Mood is good. Has hallucinations at night with good insight into them. No sign of psychosis. Not having hallucinations during the day.  HPI:  Patient interviewed. Chart reviewed. Labs and vitals reviewed. Consult because of persistent hallucinations. Patient states that her hallucinations primarily happen at night. She doesn't have an awareness that they are happening during the daytime. She says the most common thing she will see is a chubby little African-American woman sitting crosslegged at the end of her bed. This is actually somebody the patient knew from years ago. Patient says it usually happens at night and that it does not make her emotionally distressed. She says it actually makes her feel sort of happy as this was someone who was a friend of hers. She also says she will sometimes see visions of her mother. Her description of this is a HEARSE going by. She still says that it does not make her emotionally upset. Patient does not have  any complaints of psychiatric symptoms in the daytime. She denies feeling depressed although she says that it has been hard adjusting to her husband passing away 3 years ago. She still has positive things that she enjoys during the day. She admits she has some difficulty sleeping much of which is due to her medical problems. The need to go to the bathroom wakes her up at night as does her joint pain. Patient absolutely denies any suicidal thoughts or wish to die. She does not show evidence of any paranoid delusions that I can identify. Patient denies any use of alcohol or drugs. Obviously the idea has arisen among the patient and her family that these symptoms could be caused by narcotics. I agreed with her that narcotics were a common cause of visual hallucinations but that it doesn't look like she is actually on any narcotics right now and it's not clear to me whether she was before coming into the hospital.  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 Living independently. Daughter lives nearby. Good relationship with her adult children and family. Reports that she still has friends living that she sees regularly. Morning her husband from 3 years ago but seems to actually have a pretty good quality of life from her description.  Medical history: Status post knee replacement on the left side. Now with painful arthritis on the right side which is evidently judged to be nonoperable. History of diastolic heart failure respiratory failure history of a myocardial infarction. Overweight.  Substance abuse history: Patient denies that she's ever had any sort of alcohol problem. She describes herself as "allergic" to alcohol because she had a bad reaction to it the first  time she tried any when she was in her 45s. Never had any kind of drug problem.  Past Psychiatric History: She says that many years ago probably about 30 or 40 years ago she was having spells of anxiety. She was referred to see a psychiatrist at  [facility]. She briefly took Xanax but didn't stay on it for very long. It sounds like she was also referred for some relaxation therapy. Never had psychiatric hospitalization never had psychiatric treatment other than described above. No history of suicide or psychotic disorder.  Risk to Self: Is patient at risk for suicide?: No Risk to Others:   Prior Inpatient Therapy:   Prior Outpatient Therapy:    Past Medical History:  Past Medical History  Diagnosis Date  . CHF (congestive heart failure) ([place])   . Thyroid disease   . Diabetes mellitus without complication ([place])   . Hypertension   . Hyperlipidemia   . Arrhythmia     PVC  . COPD (chronic obstructive pulmonary disease) ([place])   . Chronic kidney disease   . OSA (obstructive sleep apnea) 2011  . Anemia   . Anxiety and depression     [name]  . Lymphoma ([place])   . Osteoarthritis   . Lymphedema     Past Surgical History  Procedure Laterality Date  . Tonsillectomy and adenoidectomy    . Appendectomy    . Knee surgery Left   . Abdominal hysterectomy    . Cardiac catheterization    . Coronary angioplasty      x4  . Cardiac stents     Family History:  Family History  Problem Relation Age of Onset  . Aneurysm Mother   . Stroke Mother   . Liver cancer Father    Family Psychiatric  History: She had an older sister who had an alcohol problem and that is the only family history she knows of. Social History:  History  Alcohol Use No     History  Drug Use No    Social History   Social History  . Marital Status: Widowed    Spouse Name: N/A  . Number of Children: N/A  . Years of Education: N/A   Social History Main Topics  . Smoking status: Former Smoker -- 1.50 packs/day for 18 years    Quit date: 06/25/1975  . Smokeless tobacco: Never Used  . Alcohol Use: No  . Drug Use: No  . Sexual Activity: Not Asked   Other Topics Concern  . None   Social History Narrative   Additional Social  History:    Allergies:   Allergies  Allergen Reactions  . Amlodipine Swelling    Leg swelling only   . Oxycodone Other (See Comments)    Reaction: patient states she was "out" for 2 hours and did not notice people in the area    . Demerol [Meperidine] Diarrhea and Nausea And Vomiting  . Lisinopril Cough  . Tape Rash    Labs:  Results for orders placed or performed during the hospital encounter of 12/15/15 (from the past 48 hour(s))  Glucose, capillary     Status: Abnormal   Collection Time: 12/18/15  9:05 PM  Result Value Ref Range   Glucose-Capillary 196 (H) 65 - 99 mg/dL  Glucose, capillary     Status: Abnormal   Collection Time: 12/19/15  7:24 AM  Result Value Ref Range   Glucose-Capillary 208 (H) 65 - 99 mg/dL   Comment 1  Notify RN   Glucose, capillary     Status: Abnormal   Collection Time: 12/19/15 11:50 AM  Result Value Ref Range   Glucose-Capillary 228 (H) 65 - 99 mg/dL   Comment 1 Notify RN   Glucose, capillary     Status: Abnormal   Collection Time: 12/19/15  4:29 PM  Result Value Ref Range   Glucose-Capillary 207 (H) 65 - 99 mg/dL   Comment 1 Notify RN   Glucose, capillary     Status: Abnormal   Collection Time: 12/19/15  8:50 PM  Result Value Ref Range   Glucose-Capillary 250 (H) 65 - 99 mg/dL   Comment 1 Notify RN   Glucose, capillary     Status: Abnormal   Collection Time: 12/20/15  8:16 AM  Result Value Ref Range   Glucose-Capillary 226 (H) 65 - 99 mg/dL  Hemoglobin     Status: Abnormal   Collection Time: 12/20/15  8:27 AM  Result Value Ref Range   Hemoglobin 7.9 (L) 12.0 - 16.0 g/dL  Glucose, capillary     Status: Abnormal   Collection Time: 12/20/15 12:12 PM  Result Value Ref Range   Glucose-Capillary 263 (H) 65 - 99 mg/dL  Hemoglobin     Status: Abnormal   Collection Time: 12/20/15 12:59 PM  Result Value Ref Range   Hemoglobin 8.2 (L) 12.0 - 16.0 g/dL    Current Facility-Administered Medications  Medication Dose Route Frequency Provider  Last Rate Last Dose  . 0.9 %  sodium chloride infusion  250 mL Intravenous PRN Demetrios Loll, MD      . acetaminophen (TYLENOL) tablet 650 mg  650 mg Oral Q4H PRN Demetrios Loll, MD   650 mg at 12/20/15 0934  . albuterol (PROVENTIL) (2.5 MG/3ML) 0.083% nebulizer solution 2.5 mg  2.5 mg Nebulization Q6H PRN Demetrios Loll, MD      . apixaban Arne [place]) tablet 2.5 mg  2.5 mg Oral BID Loleta Dicker, RPH   2.5 mg at 12/20/15 0912  . atorvastatin (LIPITOR) tablet 80 mg  80 mg Oral QHS Demetrios Loll, MD   80 mg at 12/19/15 2154  . baclofen (LIORESAL) tablet 5 mg  5 mg Oral QHS PRN Saundra Shelling, MD   5 mg at 12/16/15 2148  . bisacodyl (DULCOLAX) EC tablet 5 mg  5 mg Oral Daily PRN Demetrios Loll, MD   5 mg at 12/19/15 2154  . calcitRIOL (ROCALTROL) capsule 0.25 mcg  0.25 mcg Oral Q M,W,F Demetrios Loll, MD   0.25 mcg at 12/20/15 0920  . docusate sodium (COLACE) capsule 100 mg  100 mg Oral Daily PRN Demetrios Loll, MD      . famotidine (PEPCID) tablet 10 mg  10 mg Oral BID Pernell Dupre, RPH      . fluticasone (FLONASE) 50 MCG/ACT nasal spray 1 spray  1 spray Each Nare Daily PRN Demetrios Loll, MD   1 spray at 12/16/15 2152  . furosemide (LASIX) tablet 40 mg  40 mg Oral Daily Theodoro Grist, MD   40 mg at 12/20/15 0926  . gabapentin (NEURONTIN) capsule 300 mg  300 mg Oral BID Demetrios Loll, MD   300 mg at 12/20/15 [id number]  . insulin aspart (novoLOG) injection 0-5 Units  0-5 Units Subcutaneous QHS Demetrios Loll, MD   2 Units at 12/19/15 2155  . insulin aspart (novoLOG) injection 0-9 Units  0-9 Units Subcutaneous TID WC Demetrios Loll, MD   5 Units at 12/20/15 1230  . insulin glargine (LANTUS) injection  50 Units  50 Units Subcutaneous QHS Demetrios Loll, MD   50 Units at 12/19/15 2154  . latanoprost (XALATAN) 0.005 % ophthalmic solution 1 drop  1 drop Both Eyes QHS Demetrios Loll, MD   1 drop at 12/19/15 2204  . levothyroxine (SYNTHROID, LEVOTHROID) tablet 137 mcg  137 mcg Oral QAC breakfast Demetrios Loll, MD   137 mcg at 12/20/15 [phone]  . levothyroxine (SYNTHROID,  LEVOTHROID) tablet 137 mcg  137 mcg Oral Q Joeseph Amor, MD   137 mcg at 12/15/15 1322  . metoprolol tartrate (LOPRESSOR) tablet 12.5 mg  12.5 mg Oral BID Demetrios Loll, MD   12.5 mg at 12/20/15 0912  . mometasone-formoterol (DULERA) 200-5 MCG/ACT inhaler 2 puff  2 puff Inhalation BID Demetrios Loll, MD   2 puff at 12/20/15 [phone]  . montelukast (SINGULAIR) tablet 10 mg  10 mg Oral QHS Demetrios Loll, MD   10 mg at 12/19/15 2154  . nitroGLYCERIN (NITROSTAT) SL tablet 0.4 mg  0.4 mg Sublingual Q5 min PRN Demetrios Loll, MD      . ondansetron [facility]) injection 4 mg  4 mg Intravenous Q6H PRN Demetrios Loll, MD   4 mg at 12/17/15 1210  . polyethylene glycol (MIRALAX / GLYCOLAX) packet 17 g  17 g Oral Daily PRN Demetrios Loll, MD      . senna [facility]) tablet 8.6 mg  1 tablet Oral Daily Theodoro Grist, MD   8.6 mg at 12/20/15 0912  . senna-docusate (Senokot-S) tablet 2 tablet  2 tablet Oral BID PRN Demetrios Loll, MD      . sodium chloride flush (NS) 0.9 % injection 3 mL  3 mL Intravenous Q12H Demetrios Loll, MD   3 mL at 12/20/15 0926  . sodium chloride flush (NS) 0.9 % injection 3 mL  3 mL Intravenous PRN Demetrios Loll, MD      . traZODone (DESYREL) tablet 50 mg  50 mg Oral QHS PRN Gonzella Lex, MD      . venlafaxine [facility]) tablet 75 mg  75 mg Oral BID Demetrios Loll, MD   75 mg at 12/20/15 [id number]    Musculoskeletal: Strength & Muscle Tone: decreased Gait & Station: unable to stand Patient leans: N/A  Psychiatric Specialty Exam: Physical Exam  Nursing note and vitals reviewed. Constitutional: She appears well-developed and well-nourished.  HENT:  Head: Normocephalic and atraumatic.  Eyes: Conjunctivae are normal. Pupils are equal, round, and reactive to light.  Neck: Normal range of motion.  Cardiovascular: Normal heart sounds.   Respiratory: Effort normal. No respiratory distress.    GI: Soft.  Musculoskeletal: Normal range of motion.       Legs: Neurological: She is alert.  Skin: Skin is warm and dry.  Psychiatric: She has a  normal mood and affect. Her behavior is normal. Judgment and thought content normal. Her speech is tangential. Cognition and memory are normal.    Review of Systems  Constitutional: Negative.   HENT: Negative.   Eyes: Negative.   Respiratory: Negative.   Cardiovascular: Negative.   Gastrointestinal: Negative.   Musculoskeletal: Negative.   Skin: Negative.   Neurological: Negative.   Psychiatric/Behavioral: Positive for hallucinations. Negative for depression, suicidal ideas, memory loss and substance abuse. The patient is not nervous/anxious and does not have insomnia.     Blood pressure 124/51, pulse 74, temperature 97.6 F (36.4 C), temperature source Oral, resp. rate 19, height 5\' 4"  (1.626 m), weight 112.946 kg (249 lb), SpO2 90 %.Body mass index is 42.72 kg/(m^2).  General Appearance: Fairly Groomed  Eye Contact:  Good  Speech:  Clear and Coherent  Volume:  Normal  Mood:  Euthymic  Affect:  Appropriate  Thought Process:  Disorganized and Descriptions of Associations: Tangential  Orientation:  Full (Time, Place, and Person)  Thought Content:  Hallucinations: Visual and Tangential  Suicidal Thoughts:  No  Homicidal Thoughts:  No  Memory:  Immediate;   Good Recent;   Good Remote;   Good  Judgement:  Fair  Insight:  Fair  Psychomotor Activity:  Decreased  Concentration:  Concentration: Fair  Recall:  [place] of Knowledge:  Fair  Language:  Fair  Akathisia:  No  Handed:  Right  AIMS (if indicated):     Assets:  Communication Skills Desire for Improvement Housing Resilience Social Support  ADL's:  Impaired  Cognition:  WNL  Sleep:        Treatment Plan Summary: Plan Patient continues to do well emotionally during the day. Tolerates the hallucinations at night. No sign of worsening delirium or psychosis. Supportive counseling but still no medication change.  Disposition: Patient does not meet criteria for psychiatric inpatient admission. Supportive therapy  provided about ongoing stressors.  Alethia Berthold, MD 12/20/2015 5:25 PM

## 2015-12-20 NOTE — Clinical Social Work Placement (Signed)
   CLINICAL SOCIAL WORK PLACEMENT  NOTE  Date:  12/20/2015  Patient Details  Name: Jill York MRN: [id number] Date of Birth: 01-Dec-1930  Clinical Social Work is seeking post-discharge placement for this patient at the [place] level of care (*CSW will initial, date and re-position this form in  chart as items are completed):  No   Patient/family provided with [facility] Work Department's list of facilities offering this level of care within the geographic area requested by the patient (or if unable, by the patient's family).  No   Patient/family informed of their freedom to choose among providers that offer the needed level of care, that participate in Medicare, Medicaid or managed care program needed by the patient, have an available bed and are willing to accept the patient.  No   Patient/family informed of [facility]'s ownership interest in [facility] and [facility], as well as of the fact that they are under no obligation to receive care at these facilities.  PASRR submitted to EDS on       PASRR number received on       Existing PASRR number confirmed on 12/19/15     FL2 transmitted to all facilities in geographic area requested by pt/family on 12/12/15     FL2 transmitted to all facilities within larger geographic area on       Patient informed that his/her managed care company has contracts with or will negotiate with certain facilities, including the following:        Yes   Patient/family informed of bed offers received.  Patient chooses bed at  [facility])     Physician recommends and patient chooses bed at      Patient to be transferred to  [facility]) on 12/20/15.  Patient to be transferred to facility by  (EMS)     Patient family notified on 12/20/15 of transfer.  Name of family member notified:  daughter     PHYSICIAN       Additional Comment:    _______________________________________________ Shela Leff, LCSW 12/20/2015, 2:43 PM

## 2015-12-20 NOTE — Progress Notes (Signed)
PHARMACIST - PHYSICIAN COMMUNICATION  CONCERNING: IV to Oral Route Change Policy  RECOMMENDATION: This patient is receiving famotidine 10mg  by the intravenous route.  Based on criteria approved by the Pharmacy and Therapeutics Committee, the intravenous medication(s) is/are being converted to the equivalent oral dose form(s).   DESCRIPTION: These criteria include:  The patient is eating (either orally or via tube) and/or has been taking other orally administered medications for a least 24 hours  The patient has no evidence of active gastrointestinal bleeding or impaired GI absorption (gastrectomy, short bowel, patient on TNA or NPO).  If you have questions about this conversion, please contact the Pharmacy Department  [phone] )  [place], PharmD Clinical Pharmacist 12/20/2015 11:29 AM

## 2015-12-20 NOTE — Discharge Summary (Signed)
[facility] [place] NAME: Jill York    MR#:  [id number]  DATE OF BIRTH:  Nov 05, 1930  DATE OF ADMISSION:  12/15/2015 ADMITTING PHYSICIAN: Demetrios Loll, MD  DATE OF DISCHARGE: 12/20/2015  PRIMARY CARE PHYSICIAN: Sharyne Peach, MD    ADMISSION DIAGNOSIS:  Weakness [R53.1] Elevated troponin I measurement [R79.89] Fall, initial encounter [W19.XXXA] Chronic kidney disease, unspecified stage [N18.9] Anemia, unspecified anemia type [D64.9]  DISCHARGE DIAGNOSIS:  Principal Problem:   Episodes of formed visual hallucinations Active Problems:   Elevated troponinn   Acute blood loss anemia   Hematochezia   SECONDARY DIAGNOSIS:   Past Medical History  Diagnosis Date  . CHF (congestive heart failure) ([place])   . Thyroid disease   . Diabetes mellitus without complication ([place])   . Hypertension   . Hyperlipidemia   . Arrhythmia     PVC  . COPD (chronic obstructive pulmonary disease) ([place])   . Chronic kidney disease   . OSA (obstructive sleep apnea) 2011  . Anemia   . Anxiety and depression     [name]  . Lymphoma ([place])   . Osteoarthritis   . Lymphedema     HOSPITAL COURSE:    82 y.o. female with a known history of anemia, CHF, COPD, hypertension and diabetes. The patient presently ED with fall today and generalized weakness.  1. Acute on chronic posthemorrhagic symptomatic anemia: She is status post 1 unit of packed red blood cell transfusion, hemoglobin level is stable at 8.2 this am.  2. Elevated troponin due to demand ischemia: This is not ACS. Continue Lipitor and metoprolol, not on aspirin due to GI bleed.   3. Gastrointestinal bleed due to unclear etiology at present, status post EGD and colonoscopy within within 1 year, which showed only polyps in colon, removed, s/p clip placement , no further diagnostics were recommended by gastroenterologist as long as bleeding subsides. Hgb stable back on  Eliquis.  4. Acute on chronic kidney injury:  status post transfusion, creatinine has improved, which is her baseline at 1.5-1.7. 5 . Visual Hallucinations: Evaluated by psych. Rec not using narcotics unless absolutely needed.#6. Right shoulder pain, no injury on x-ray  7. Left ankle pain s/p fall: As per ORTHO Ace bandage. An ankle-foot orthosis. Follow up with [facility] outpatient in 2 weeks  8 generalized weakness, physical therapist recommended skilled nursing facility placement  9; OSA: Continue PRN nocturnal O2 sats 90-2%  DISCHARGE CONDITIONS AND DIET:   Stable for discharge on heart healthy diet  CONSULTS OBTAINED:  Treatment Team:  [name], MD Isaias Cowman, MD Corey Skains, MD Clovis Fredrickson, MD Gonzella Lex, MD Chelsea Primus, MD Earnestine Leys, MD  DRUG ALLERGIES:   Allergies  Allergen Reactions  . Amlodipine Swelling    Leg swelling only   . Oxycodone Other (See Comments)    Reaction: patient states she was "out" for 2 hours and did not notice people in the area    . Demerol [Meperidine] Diarrhea and Nausea And Vomiting  . Lisinopril Cough  . Tape Rash    DISCHARGE MEDICATIONS:   Current Discharge Medication List    CONTINUE these medications which have NOT CHANGED   Details  acetaminophen (TYLENOL) 500 MG tablet Take 1,000 mg by mouth every 6 (six) hours as needed.    albuterol (PROVENTIL HFA;VENTOLIN HFA) 108 (90 BASE) MCG/ACT inhaler Inhale 1 puff into the lungs every 4 (four) hours  as needed for wheezing or shortness of breath.     albuterol (PROVENTIL) (2.5 MG/3ML) 0.083% nebulizer solution Take 2.5 mg by nebulization every 6 (six) hours as needed for wheezing or shortness of breath.    apixaban (ELIQUIS) 2.5 MG TABS tablet Take 2.5 mg by mouth 2 (two) times daily.    atorvastatin (LIPITOR) 80 MG tablet Take 80 mg by mouth daily.    baclofen (LIORESAL) 10 MG tablet Take 5 mg by mouth at bedtime as needed for muscle spasms.     bisacodyl (DULCOLAX) 5 MG EC tablet Take 5 mg by mouth daily as needed for moderate constipation.    calcitRIOL (ROCALTROL) 0.25 MCG capsule Take 0.25 mcg by mouth every Monday, Wednesday, and Friday.    docusate sodium (COLACE) 100 MG capsule Take 100 mg by mouth daily as needed for mild constipation or moderate constipation.     esomeprazole (NEXIUM) 20 MG capsule Take 20 mg by mouth daily as needed (for acid reflux).    fluticasone (FLONASE) 50 MCG/ACT nasal spray Place 1 spray into both nostrils daily.    Fluticasone-Salmeterol (ADVAIR) 250-50 MCG/DOSE AEPB Inhale 1 puff into the lungs 2 (two) times daily.    furosemide (LASIX) 40 MG tablet Take 40 mg by mouth daily.    gabapentin (NEURONTIN) 300 MG capsule Take 300 mg by mouth 2 (two) times daily.    insulin glargine (LANTUS) 100 UNIT/ML injection Inject 50 Units into the skin at bedtime.     insulin lispro (HUMALOG) 100 UNIT/ML injection Inject 20-26 Units into the skin 3 (three) times daily before meals. Inject 20 units sub-q in morning, inject 26 units sub-q in afternoon, and inject 20 units sub-q in evening    latanoprost (XALATAN) 0.005 % ophthalmic solution Place 1 drop into both eyes at bedtime.    levothyroxine (SYNTHROID, LEVOTHROID) 137 MCG tablet Take 137-274 mcg by mouth daily before breakfast. Take 119mcg (1 tablet) daily on Mon, Tues, Wed, Thurs, Sat, and Sun. Take 225mcg (2 tabs) daily on Friday. (Take with a glass of water at least 30 to 60 minutes before breakfast)    metoprolol tartrate (LOPRESSOR) 25 MG tablet Take 0.5 tablets (12.5 mg total) by mouth 2 (two) times daily. Qty: 30 tablet, Refills: 0    montelukast (SINGULAIR) 10 MG tablet Take 10 mg by mouth at bedtime.    nitroGLYCERIN (NITROSTAT) 0.4 MG SL tablet Place 0.4 mg under the tongue every 5 (five) minutes as needed for chest pain.    nystatin-triamcinolone (MYCOLOG II) cream Apply 1 application topically 2 (two) times daily.    polyethylene glycol  (MIRALAX / GLYCOLAX) packet Take 17 g by mouth daily as needed.    senna-docusate (SENOKOT-S) 8.6-50 MG tablet Take 2 tablets by mouth 2 (two) times daily as needed for mild constipation.    tetrahydrozoline 0.05 % ophthalmic solution Place 1 drop into both eyes daily as needed.    venlafaxine (EFFEXOR) 75 MG tablet Take 75 mg by mouth 2 (two) times daily.    amLODipine (NORVASC) 5 MG tablet Take 5 mg by mouth daily.    Cholecalciferol 1000 UNITS tablet Take 1,000 Units by mouth daily.      STOP taking these medications     aspirin EC 81 MG tablet      diclofenac sodium (VOLTAREN) 1 % GEL      losartan (COZAAR) 25 MG tablet      Calcium Carbonate-Vitamin D 600-400 MG-UNIT per tablet  Today   CHIEF COMPLAINT:   No acute events overnight. Hemoglobin is stable. No dark-colored stools.   VITAL SIGNS:  Blood pressure 128/54, pulse 76, temperature 97.6 F (36.4 C), temperature source Oral, resp. rate 21, height 5\' 4"  (1.626 m), weight 112.946 kg (249 lb), SpO2 93 %.   REVIEW OF SYSTEMS:  Review of Systems  Constitutional: Negative for fever, chills and malaise/fatigue.  HENT: Negative for ear discharge, ear pain, hearing loss, nosebleeds and sore throat.   Eyes: Negative for blurred vision and pain.  Respiratory: Negative for cough, hemoptysis, shortness of breath and wheezing.   Cardiovascular: Negative for chest pain, palpitations and leg swelling.  Gastrointestinal: Negative for nausea, vomiting, abdominal pain, diarrhea and blood in stool.  Genitourinary: Negative for dysuria.  Musculoskeletal: Negative for back pain.  Neurological: Negative for dizziness, tremors, speech change, focal weakness, seizures and headaches.  Endo/Heme/Allergies: Does not bruise/bleed easily.  Psychiatric/Behavioral: Negative for depression, suicidal ideas and hallucinations.     PHYSICAL EXAMINATION:  GENERAL:  82 y.o.-year-old patient lying in the bed with no acute  distress. obese NECK:  Supple, no jugular venous distention. No thyroid enlargement, no tenderness.  LUNGS: Normal breath sounds bilaterally, no wheezing, rales,rhonchi  No use of accessory muscles of respiration.  CARDIOVASCULAR: S1, S2 normal. No murmurs, rubs, or gallops.  ABDOMEN: Soft, non-tender, non-distended. Bowel sounds present. No organomegaly or mass.  EXTREMITIES: No pedal edema, cyanosis, or clubbing. Has orthostasis placed  PSYCHIATRIC: The patient is alert and oriented x 3.  SKIN: No obvious rash, lesion, or ulcer.   DATA REVIEW:   CBC  Recent Labs Lab 12/15/15 0828  12/20/15 1259  WBC 8.2  --   --   HGB 8.4*  < > 8.2*  HCT 26.1*  --   --   PLT 175  --   --   < > = values in this interval not displayed.  Chemistries   Recent Labs Lab 12/15/15 0828 12/15/15 1225 12/17/15 0535  NA 140  --  142  K 4.4  --  4.1  CL 105  --  106  CO2 25  --  28  GLUCOSE 105*  --  253*  BUN 49*  --  48*  CREATININE 2.10*  --  1.70*  CALCIUM 8.7*  --  8.6*  MG  --  2.3  --   AST 34  --   --   ALT 30  --   --   ALKPHOS 77  --   --   BILITOT 0.7  --   --     Cardiac Enzymes  Recent Labs Lab 12/15/15 0828 12/15/15 1349 12/15/15 2007  TROPONINI 0.77* 0.65* 0.45*    Microbiology Results  @[email or web address] @  RADIOLOGY:  Dg Foot Complete Left  12/19/2015  CLINICAL DATA:  Patient fell yesterday while getting out of bed, previous recent fall, pain in lower leg and throughout left ankle, tender to touch, difficult to obtain images- tech held for positioning EXAM: LEFT FOOT - COMPLETE 3+ VIEW COMPARISON:  None. FINDINGS: There is and oblique comminuted fracture of the lateral malleolus. There are plantar and Achilles spurs of the calcaneus. Vascular calcifications are present. IMPRESSION: Comminuted fracture of the lateral malleolus. Consider dedicated views of the ankle. These results will be called to the ordering clinician or representative by the Radiologist Assistant, and  communication documented in the PACS or zVision Dashboard. Electronically Signed   By: Nolon Nations M.D.   On: 12/19/2015 09:34  Management plans discussed with the patient and she is in agreement. Stable for discharge SNF  Patient should follow up with PCP in 1 week  CODE STATUS:     Code Status Orders        Start     Ordered   12/15/15 1200  Do not attempt resuscitation (DNR)   Continuous    Question Answer Comment  In the event of cardiac or respiratory ARREST Do not call a "code blue"   In the event of cardiac or respiratory ARREST Do not perform Intubation, CPR, defibrillation or ACLS   In the event of cardiac or respiratory ARREST Use medication by any route, position, wound care, and other measures to relive pain and suffering. May use oxygen, suction and manual treatment of airway obstruction as needed for comfort.      12/15/15 1159    Code Status History    Date Active Date Inactive Code Status Order ID Comments User Context   01/01/2015 10:29 AM 01/04/2015  1:43 PM DNR [id number]  Loletha Grayer, MD ED    Advance Directive Documentation        Most Recent Value   Type of Advance Directive  Healthcare Power of Attorney   Pre-existing out of facility DNR order (yellow form or pink MOST form)     "MOST" Form in Place?        TOTAL TIME TAKING CARE OF THIS PATIENT: 35 minutes.    Note: This dictation was prepared with Dragon dictation along with smaller phrase technology. Any transcriptional errors that result from this process are unintentional.  Bronsyn Shappell M.D on 12/20/2015 at 1:52 PM  Between 7am to 6pm - Pager - [phone] After 6pm go to [email or web address] - password [email or web address] [facility]  Office  [phone]  CC: Primary care physician; Sharyne Peach, MD

## 2015-12-20 NOTE — Care Management Important Message (Signed)
Important Message  Patient Details  Name: Jill York MRN: [id number] Date of Birth: 09-May-1931   Medicare Important Message Given:  Yes    Juliann Pulse A Rana Hochstein 12/20/2015, 1:44 PM

## 2015-12-20 NOTE — Care Management (Addendum)
Per attending- if hemoglobin is stable after lunch.  Patient is in agreement with discharge.  Updated CSW

## 2015-12-21 DIAGNOSIS — I5042 Chronic combined systolic (congestive) and diastolic (congestive) heart failure: Secondary | ICD-10-CM

## 2015-12-21 DIAGNOSIS — S8292XA Unspecified fracture of left lower leg, initial encounter for closed fracture: Secondary | ICD-10-CM

## 2015-12-21 DIAGNOSIS — I482 Chronic atrial fibrillation: Secondary | ICD-10-CM

## 2015-12-21 DIAGNOSIS — E1121 Type 2 diabetes mellitus with diabetic nephropathy: Secondary | ICD-10-CM

## 2015-12-21 DIAGNOSIS — K922 Gastrointestinal hemorrhage, unspecified: Secondary | ICD-10-CM

## 2015-12-21 DIAGNOSIS — J438 Other emphysema: Secondary | ICD-10-CM

## 2015-12-21 DIAGNOSIS — R441 Visual hallucinations: Secondary | ICD-10-CM | POA: Diagnosis not present

## 2015-12-21 DIAGNOSIS — F39 Unspecified mood [affective] disorder: Secondary | ICD-10-CM

## 2015-12-25 ENCOUNTER — Encounter: Payer: Self-pay | Admitting: Emergency Medicine

## 2015-12-25 ENCOUNTER — Inpatient Hospital Stay
Admission: EM | Admit: 2015-12-25 | Discharge: 2016-01-04 | DRG: 377 | Disposition: A | Discharge status: Skilled Nursing Facility | Payer: Medicare Other | Attending: Internal Medicine | Admitting: Internal Medicine

## 2015-12-25 DIAGNOSIS — Z955 Presence of coronary angioplasty implant and graft: Secondary | ICD-10-CM | POA: Diagnosis not present

## 2015-12-25 DIAGNOSIS — G4733 Obstructive sleep apnea (adult) (pediatric): Secondary | ICD-10-CM | POA: Diagnosis present

## 2015-12-25 DIAGNOSIS — IMO0001 Reserved for inherently not codable concepts without codable children: Secondary | ICD-10-CM

## 2015-12-25 DIAGNOSIS — E1122 Type 2 diabetes mellitus with diabetic chronic kidney disease: Secondary | ICD-10-CM | POA: Diagnosis present

## 2015-12-25 DIAGNOSIS — E669 Obesity, unspecified: Secondary | ICD-10-CM | POA: Diagnosis present

## 2015-12-25 DIAGNOSIS — Z66 Do not resuscitate: Secondary | ICD-10-CM | POA: Diagnosis present

## 2015-12-25 DIAGNOSIS — K922 Gastrointestinal hemorrhage, unspecified: Secondary | ICD-10-CM

## 2015-12-25 DIAGNOSIS — I252 Old myocardial infarction: Secondary | ICD-10-CM

## 2015-12-25 DIAGNOSIS — E870 Hyperosmolality and hypernatremia: Secondary | ICD-10-CM | POA: Diagnosis present

## 2015-12-25 DIAGNOSIS — F329 Major depressive disorder, single episode, unspecified: Secondary | ICD-10-CM | POA: Diagnosis present

## 2015-12-25 DIAGNOSIS — D500 Iron deficiency anemia secondary to blood loss (chronic): Secondary | ICD-10-CM | POA: Diagnosis present

## 2015-12-25 DIAGNOSIS — I130 Hypertensive heart and chronic kidney disease with heart failure and stage 1 through stage 4 chronic kidney disease, or unspecified chronic kidney disease: Secondary | ICD-10-CM | POA: Diagnosis present

## 2015-12-25 DIAGNOSIS — E079 Disorder of thyroid, unspecified: Secondary | ICD-10-CM | POA: Diagnosis present

## 2015-12-25 DIAGNOSIS — S8262XD Displaced fracture of lateral malleolus of left fibula, subsequent encounter for closed fracture with routine healing: Secondary | ICD-10-CM

## 2015-12-25 DIAGNOSIS — Z8572 Personal history of non-Hodgkin lymphomas: Secondary | ICD-10-CM | POA: Diagnosis not present

## 2015-12-25 DIAGNOSIS — Z6841 Body Mass Index (BMI) 40.0 and over, adult: Secondary | ICD-10-CM | POA: Diagnosis not present

## 2015-12-25 DIAGNOSIS — N183 Chronic kidney disease, stage 3 (moderate): Secondary | ICD-10-CM | POA: Diagnosis present

## 2015-12-25 DIAGNOSIS — D132 Benign neoplasm of duodenum: Secondary | ICD-10-CM | POA: Diagnosis present

## 2015-12-25 DIAGNOSIS — K921 Melena: Principal | ICD-10-CM | POA: Diagnosis present

## 2015-12-25 DIAGNOSIS — M1990 Unspecified osteoarthritis, unspecified site: Secondary | ICD-10-CM | POA: Diagnosis present

## 2015-12-25 DIAGNOSIS — J449 Chronic obstructive pulmonary disease, unspecified: Secondary | ICD-10-CM | POA: Diagnosis present

## 2015-12-25 DIAGNOSIS — Z87891 Personal history of nicotine dependence: Secondary | ICD-10-CM | POA: Diagnosis not present

## 2015-12-25 DIAGNOSIS — F419 Anxiety disorder, unspecified: Secondary | ICD-10-CM | POA: Diagnosis present

## 2015-12-25 DIAGNOSIS — W19XXXD Unspecified fall, subsequent encounter: Secondary | ICD-10-CM | POA: Diagnosis present

## 2015-12-25 DIAGNOSIS — E785 Hyperlipidemia, unspecified: Secondary | ICD-10-CM | POA: Diagnosis present

## 2015-12-25 DIAGNOSIS — I5043 Acute on chronic combined systolic (congestive) and diastolic (congestive) heart failure: Secondary | ICD-10-CM | POA: Diagnosis present

## 2015-12-25 DIAGNOSIS — D62 Acute posthemorrhagic anemia: Secondary | ICD-10-CM | POA: Diagnosis present

## 2015-12-25 DIAGNOSIS — B3781 Candidal esophagitis: Secondary | ICD-10-CM | POA: Diagnosis present

## 2015-12-25 DIAGNOSIS — K2960 Other gastritis without bleeding: Secondary | ICD-10-CM | POA: Diagnosis present

## 2015-12-25 DIAGNOSIS — I248 Other forms of acute ischemic heart disease: Secondary | ICD-10-CM | POA: Diagnosis present

## 2015-12-25 DIAGNOSIS — D649 Anemia, unspecified: Secondary | ICD-10-CM

## 2015-12-25 DIAGNOSIS — I5032 Chronic diastolic (congestive) heart failure: Secondary | ICD-10-CM

## 2015-12-25 DIAGNOSIS — I4891 Unspecified atrial fibrillation: Secondary | ICD-10-CM | POA: Diagnosis present

## 2015-12-25 LAB — CBC
HEMATOCRIT: 21.1 % — AB (ref 35.0–47.0)
HEMOGLOBIN: 6.7 g/dL — AB (ref 12.0–16.0)
MCH: 30.6 pg (ref 26.0–34.0)
MCHC: 31.7 g/dL — ABNORMAL LOW (ref 32.0–36.0)
MCV: 96.4 fL (ref 80.0–100.0)
Platelets: 179 10*3/uL (ref 150–440)
RBC: 2.19 MIL/uL — ABNORMAL LOW (ref 3.80–5.20)
RDW: 18.3 % — AB (ref 11.5–14.5)
WBC: 7.5 10*3/uL (ref 3.6–11.0)

## 2015-12-25 LAB — COMPREHENSIVE METABOLIC PANEL
ALBUMIN: 3.1 g/dL — AB (ref 3.5–5.0)
ALT: 25 U/L (ref 14–54)
ANION GAP: 7 (ref 5–15)
AST: 23 U/L (ref 15–41)
Alkaline Phosphatase: 72 U/L (ref 38–126)
BUN: 45 mg/dL — AB (ref 6–20)
CHLORIDE: 107 mmol/L (ref 101–111)
CO2: 27 mmol/L (ref 22–32)
Calcium: 8.5 mg/dL — ABNORMAL LOW (ref 8.9–10.3)
Creatinine, Ser: 1.72 mg/dL — ABNORMAL HIGH (ref 0.44–1.00)
GFR calc Af Amer: 30 mL/min — ABNORMAL LOW (ref >60)
GFR, EST NON AFRICAN AMERICAN: 26 mL/min — AB (ref >60)
GLUCOSE: 91 mg/dL (ref 65–99)
POTASSIUM: 4.2 mmol/L (ref 3.5–5.1)
Sodium: 141 mmol/L (ref 135–145)
Total Bilirubin: 0.4 mg/dL (ref 0.3–1.2)
Total Protein: 6.3 g/dL — ABNORMAL LOW (ref 6.5–8.1)

## 2015-12-25 LAB — GLUCOSE, CAPILLARY
GLUCOSE-CAPILLARY: 64 mg/dL — AB (ref 65–99)
Glucose-Capillary: 91 mg/dL (ref 65–99)

## 2015-12-25 LAB — PREPARE RBC (CROSSMATCH)

## 2015-12-25 LAB — TROPONIN I
TROPONIN I: 0.04 ng/mL — AB (ref <0.03)
TROPONIN I: 0.05 ng/mL — AB (ref <0.03)

## 2015-12-25 MED ORDER — ALBUTEROL SULFATE (2.5 MG/3ML) 0.083% IN NEBU
2.5000 mg | INHALATION_SOLUTION | Freq: Four times a day (QID) | RESPIRATORY_TRACT | Status: DC | PRN
Start: 2015-12-25 — End: 2016-01-04

## 2015-12-25 MED ORDER — ACETAMINOPHEN 325 MG PO TABS
650.0000 mg | ORAL_TABLET | Freq: Four times a day (QID) | ORAL | Status: DC | PRN
  Administered 2015-12-25 – 2015-12-29 (×6): 650 mg via ORAL
  Filled 2015-12-25 (×6): qty 2

## 2015-12-25 MED ORDER — PANTOPRAZOLE SODIUM 40 MG PO TBEC: 40.0000 mg | DELAYED_RELEASE_TABLET | Freq: Every day | ORAL | Status: DC

## 2015-12-25 MED ORDER — METOPROLOL TARTRATE 25 MG PO TABS
12.5000 mg | ORAL_TABLET | Freq: Two times a day (BID) | ORAL | Status: DC
  Administered 2015-12-25 – 2015-12-26 (×3): 12.5 mg via ORAL
  Administered 2015-12-27: 25 mg via ORAL
  Administered 2015-12-27 – 2015-12-30 (×5): 12.5 mg via ORAL
  Filled 2015-12-25 (×10): qty 1

## 2015-12-25 MED ORDER — INSULIN ASPART 100 UNIT/ML ~~LOC~~ SOLN
0.0000 [IU] | Freq: Every day | SUBCUTANEOUS | Status: DC
  Filled 2015-12-25: qty 3

## 2015-12-25 MED ORDER — AMLODIPINE BESYLATE 5 MG PO TABS: 5.0000 mg | ORAL_TABLET | Freq: Every day | ORAL | Status: DC

## 2015-12-25 MED ORDER — LEVOTHYROXINE SODIUM 137 MCG PO TABS
137.0000 ug | ORAL_TABLET | ORAL | Status: DC
  Administered 2015-12-27 – 2016-01-04 (×7): 137 ug via ORAL
  Filled 2015-12-25 (×7): qty 1

## 2015-12-25 MED ORDER — VENLAFAXINE HCL 37.5 MG PO TABS
75.0000 mg | ORAL_TABLET | Freq: Two times a day (BID) | ORAL | Status: DC
  Administered 2015-12-26 – 2016-01-04 (×20): 75 mg via ORAL
  Filled 2015-12-25 (×23): qty 2

## 2015-12-25 MED ORDER — ALBUTEROL SULFATE HFA 108 (90 BASE) MCG/ACT IN AERS: 1.0000 | INHALATION_SPRAY | RESPIRATORY_TRACT | Status: DC | PRN

## 2015-12-25 MED ORDER — SODIUM CHLORIDE 0.9 % IV SOLN
Freq: Once | INTRAVENOUS | Status: AC
  Administered 2015-12-25: 23:00:00 via INTRAVENOUS

## 2015-12-25 MED ORDER — NITROGLYCERIN 0.4 MG SL SUBL: 0.4000 mg | SUBLINGUAL_TABLET | SUBLINGUAL | Status: DC | PRN

## 2015-12-25 MED ORDER — VITAMIN D 1000 UNITS PO TABS: 1000.0000 [IU] | ORAL_TABLET | Freq: Every day | ORAL | Status: DC

## 2015-12-25 MED ORDER — SODIUM CHLORIDE 0.9 % IV SOLN: INTRAVENOUS | Status: DC

## 2015-12-25 MED ORDER — BACLOFEN 10 MG PO TABS
5.0000 mg | ORAL_TABLET | Freq: Every evening | ORAL | Status: DC | PRN
  Administered 2015-12-29 – 2016-01-01 (×3): 5 mg via ORAL
  Filled 2015-12-25: qty 0.5
  Filled 2015-12-25 (×2): qty 1

## 2015-12-25 MED ORDER — FLUTICASONE PROPIONATE 50 MCG/ACT NA SUSP
1.0000 | Freq: Every day | NASAL | Status: DC
  Administered 2015-12-25 – 2016-01-04 (×11): 1 via NASAL
  Filled 2015-12-25: qty 16

## 2015-12-25 MED ORDER — SODIUM CHLORIDE 0.9 % IV SOLN
8.0000 mg/h | INTRAVENOUS | Status: AC
  Administered 2015-12-25 – 2015-12-28 (×6): 8 mg/h via INTRAVENOUS
  Filled 2015-12-25 (×6): qty 80

## 2015-12-25 MED ORDER — ATORVASTATIN CALCIUM 20 MG PO TABS: 80.0000 mg | ORAL_TABLET | Freq: Every day | ORAL | Status: DC

## 2015-12-25 MED ORDER — MONTELUKAST SODIUM 10 MG PO TABS
10.0000 mg | ORAL_TABLET | Freq: Every day | ORAL | Status: DC
  Administered 2015-12-25 – 2016-01-03 (×10): 10 mg via ORAL
  Filled 2015-12-25 (×10): qty 1

## 2015-12-25 MED ORDER — LATANOPROST 0.005 % OP SOLN
1.0000 [drp] | Freq: Every day | OPHTHALMIC | Status: DC
  Administered 2015-12-25 – 2016-01-03 (×10): 1 [drp] via OPHTHALMIC
  Filled 2015-12-25: qty 2.5

## 2015-12-25 MED ORDER — INSULIN ASPART 100 UNIT/ML ~~LOC~~ SOLN
0.0000 [IU] | Freq: Three times a day (TID) | SUBCUTANEOUS | Status: DC
  Administered 2015-12-26: 3 [IU] via SUBCUTANEOUS

## 2015-12-25 MED ORDER — FUROSEMIDE 40 MG PO TABS
40.0000 mg | ORAL_TABLET | Freq: Every day | ORAL | Status: DC
  Administered 2015-12-26 – 2015-12-28 (×3): 40 mg via ORAL
  Filled 2015-12-25 (×3): qty 1

## 2015-12-25 MED ORDER — DEXTROSE 50 % IV SOLN
INTRAVENOUS | Status: AC
  Filled 2015-12-25: qty 50

## 2015-12-25 MED ORDER — DEXTROSE 50 % IV SOLN
25.0000 mL | Freq: Once | INTRAVENOUS | Status: AC
  Administered 2015-12-25: 25 mL via INTRAVENOUS

## 2015-12-25 MED ORDER — MOMETASONE FURO-FORMOTEROL FUM 200-5 MCG/ACT IN AERO
2.0000 | INHALATION_SPRAY | Freq: Two times a day (BID) | RESPIRATORY_TRACT | Status: DC
  Administered 2015-12-25 – 2016-01-04 (×20): 2 via RESPIRATORY_TRACT
  Filled 2015-12-25: qty 8.8

## 2015-12-25 MED ORDER — SODIUM CHLORIDE 0.9% FLUSH
3.0000 mL | Freq: Two times a day (BID) | INTRAVENOUS | Status: DC
  Administered 2015-12-25 – 2016-01-04 (×17): 3 mL via INTRAVENOUS

## 2015-12-25 MED ORDER — PANTOPRAZOLE SODIUM 40 MG IV SOLR
40.0000 mg | Freq: Two times a day (BID) | INTRAVENOUS | Status: DC
  Administered 2015-12-29: 40 mg via INTRAVENOUS
  Filled 2015-12-25: qty 40

## 2015-12-25 MED ORDER — GABAPENTIN 300 MG PO CAPS
300.0000 mg | ORAL_CAPSULE | Freq: Two times a day (BID) | ORAL | Status: DC
  Administered 2015-12-25 – 2016-01-04 (×20): 300 mg via ORAL
  Filled 2015-12-25 (×20): qty 1

## 2015-12-25 MED ORDER — PANTOPRAZOLE SODIUM 40 MG IV SOLR
80.0000 mg | Freq: Once | INTRAVENOUS | Status: AC
  Administered 2015-12-25: 80 mg via INTRAVENOUS
  Filled 2015-12-25: qty 80

## 2015-12-25 MED ORDER — LOSARTAN POTASSIUM 25 MG PO TABS
25.0000 mg | ORAL_TABLET | Freq: Every day | ORAL | Status: DC
  Administered 2015-12-26 – 2015-12-27 (×2): 25 mg via ORAL
  Filled 2015-12-25 (×2): qty 1

## 2015-12-25 MED ORDER — INSULIN GLARGINE 100 UNIT/ML ~~LOC~~ SOLN
54.0000 [IU] | Freq: Every day | SUBCUTANEOUS | Status: DC
  Filled 2015-12-25 (×2): qty 0.54

## 2015-12-25 MED ORDER — LEVOTHYROXINE SODIUM 137 MCG PO TABS
274.0000 ug | ORAL_TABLET | ORAL | Status: DC
  Administered 2015-12-29: 274 ug via ORAL
  Filled 2015-12-25: qty 2

## 2015-12-25 MED ORDER — CALCITRIOL 0.25 MCG PO CAPS: 0.2500 ug | ORAL_CAPSULE | ORAL | Status: DC

## 2015-12-25 NOTE — H&P (Signed)
Jill York is an 82 y.o. female.   Chief Complaint: Weakness HPI: Patient recently discharge after hospitalization for anemia and GI bleeding. She had a upper and lower endoscopy done about a year ago in which she had gastric polyp clipped. Last admission HgB stabilized after ASA and Eliquis were stopped. It was elected not to repeat endoscopy at that point. Patient was restarted on Eliquis but not ASA and HgB remained stable through remainder of hospital stay. She started feeling weak over past 24 hrs and noted black stools.  Past Medical History  Diagnosis Date  . CHF (congestive heart failure) ([place])   . Thyroid disease   . Diabetes mellitus without complication ([place])   . Hypertension   . Hyperlipidemia   . Arrhythmia     PVC  . COPD (chronic obstructive pulmonary disease) ([place])   . Chronic kidney disease   . OSA (obstructive sleep apnea) 2011  . Anemia   . Anxiety and depression     [name]  . Lymphoma ([place])   . Osteoarthritis   . Lymphedema     Past Surgical History  Procedure Laterality Date  . Tonsillectomy and adenoidectomy    . Appendectomy    . Knee surgery Left   . Abdominal hysterectomy    . Cardiac catheterization    . Coronary angioplasty      x4  . Cardiac stents      Family History  Problem Relation Age of Onset  . Aneurysm Mother   . Stroke Mother   . Liver cancer Father    Social History:  reports that she quit smoking about 40 years ago. She has never used smokeless tobacco. She reports that she does not drink alcohol or use illicit drugs.  Allergies:  Allergies  Allergen Reactions  . Amlodipine Swelling    Leg swelling only   . Oxycodone Other (See Comments)    Reaction: patient states she was "out" for 2 hours and did not notice people in the area    . Demerol [Meperidine] Diarrhea and Nausea And Vomiting  . Lisinopril Cough  . Tape Rash     (Not in a hospital admission)  Results for orders placed or  performed during the hospital encounter of 12/25/15 (from the past 48 hour(s))  Comprehensive metabolic panel     Status: Abnormal   Collection Time: 12/25/15  5:30 PM  Result Value Ref Range   Sodium 141 135 - 145 mmol/L   Potassium 4.2 3.5 - 5.1 mmol/L   Chloride 107 101 - 111 mmol/L   CO2 27 22 - 32 mmol/L   Glucose, Bld 91 65 - 99 mg/dL   BUN 45 (H) 6 - 20 mg/dL   Creatinine, Ser 1.72 (H) 0.44 - 1.00 mg/dL   Calcium 8.5 (L) 8.9 - 10.3 mg/dL   Total Protein 6.3 (L) 6.5 - 8.1 g/dL   Albumin 3.1 (L) 3.5 - 5.0 g/dL   AST 23 15 - 41 U/L   ALT 25 14 - 54 U/L   Alkaline Phosphatase 72 38 - 126 U/L   Total Bilirubin 0.4 0.3 - 1.2 mg/dL   GFR calc non Af Amer 26 (L) >60 mL/min   GFR calc Af Amer 30 (L) >60 mL/min    Comment: (NOTE) The eGFR has been calculated using the CKD EPI equation. This calculation has not been validated in all clinical situations. eGFR's persistently <60 mL/min signify possible Chronic Kidney Disease.  Anion gap 7 5 - 15  CBC     Status: Abnormal   Collection Time: 12/25/15  5:30 PM  Result Value Ref Range   WBC 7.5 3.6 - 11.0 K/uL   RBC 2.19 (L) 3.80 - 5.20 MIL/uL   Hemoglobin 6.7 (L) 12.0 - 16.0 g/dL   HCT 21.1 (L) 35.0 - 47.0 %   MCV 96.4 80.0 - 100.0 fL   MCH 30.6 26.0 - 34.0 pg   MCHC 31.7 (L) 32.0 - 36.0 g/dL   RDW 18.3 (H) 11.5 - 14.5 %   Platelets 179 150 - 440 K/uL  Type and screen [facility]     Status: None (Preliminary result)   Collection Time: 12/25/15  5:30 PM  Result Value Ref Range   ABO/RH(D) A POS    Antibody Screen NEG    Sample Expiration 12/28/2015    Unit Number [id number]    Blood Component Type RED CELLS,LR    Unit division 00    Status of Unit ALLOCATED    Transfusion Status OK TO TRANSFUSE    Crossmatch Result Compatible    Unit Number [id number]    Blood Component Type RED CELLS,LR    Unit division 00    Status of Unit ALLOCATED    Transfusion Status OK TO TRANSFUSE    Crossmatch  Result Compatible   Troponin I     Status: Abnormal   Collection Time: 12/25/15  5:30 PM  Result Value Ref Range   Troponin I 0.04 (HH) <0.03 ng/mL    Comment: CRITICAL RESULT CALLED TO, READ BACK BY AND VERIFIED WITH  KIM GAULT AT 1845 12/25/15 SDR   Prepare RBC     Status: None   Collection Time: 12/25/15  7:30 PM  Result Value Ref Range   Order Confirmation ORDER PROCESSED BY BLOOD BANK    No results found.  Review of Systems  Constitutional: Negative for fever and chills.  HENT: Negative for hearing loss.   Eyes: Negative for blurred vision.  Respiratory: Negative for shortness of breath.   Cardiovascular: Positive for leg swelling. Negative for chest pain.  Gastrointestinal: Negative for nausea and vomiting.  Genitourinary: Negative for dysuria.  Musculoskeletal: Positive for joint pain.  Skin: Negative for rash.  Neurological: Negative for sensory change.    Blood pressure 104/75, pulse 77, temperature 97.5 F (36.4 C), temperature source Oral, resp. rate 18, height 5' 4"  (1.626 m), weight 108.863 kg (240 lb), SpO2 94 %. Physical Exam  Constitutional: She is oriented to person, place, and time. She appears well-developed and well-nourished. No distress.  HENT:  Head: Normocephalic and atraumatic.  Mouth/Throat: Oropharynx is clear and moist. No oropharyngeal exudate.  Eyes: EOM are normal. Pupils are equal, round, and reactive to light.  Neck: Neck supple. No JVD present. No tracheal deviation present. No thyromegaly present.  Cardiovascular: Normal rate and regular rhythm.   No murmur heard. Respiratory: Effort normal and breath sounds normal. No respiratory distress. She exhibits no tenderness.  GI: Soft. Bowel sounds are normal. She exhibits no mass.  Musculoskeletal: Normal range of motion. She exhibits no edema or tenderness.  Lymphadenopathy:    She has no cervical adenopathy.  Neurological: She is alert and oriented to person, place, and time. No cranial nerve  deficit.  Skin: Skin is warm and dry. No erythema.     Assessment/Plan 1. Acute Blood Loss Anemia: Secondary to GI blood loss. Will transfuse 2 units PRBC. Stop Eliquis. Consult GI.  2.  GI Bleed: Discussed with GI. Patient will likely need upper endoscopy. Will start protonix.  3. Stage 3 CRF: Base line Cr 1.5-1.7. Appears to be at this range. Monitor.  4. DM: Will be NPO overnight. Will continue her basal lantus for now but hold her bolus doses and use SSI until taking PO.  5. Left Ankle Fx: Continue wearing boot. Is to follow up with ortho as scheduled. Dx on last admission.  6. Code Status: Patient has MOST form on file and wishes to be DNR.  Total time spent: 50 min  Baxter Hire, MD 12/25/2015, 7:16 PM

## 2015-12-25 NOTE — ED Provider Notes (Signed)
[facility] Emergency Department Provider Note   ____________________________________________  Time seen: Seen upon arrival to the emergency department  I have reviewed the triage vital signs and the nursing notes.   HISTORY  Chief Complaint Abnormal Lab   HPI Jill York is a 82 y.o. female with a history of CHF as well as atrial fibrillation on eliquis who is presenting to the emergency department with weakness as well as black stools.States that she has had intermittent black stool since October 2016. Has had several over the past several days with associated weakness. Had her hemoglobin checked as an outpatient and was found to be 6.2. She does have a history of chronic anemia but this is worse than her baseline. Denies any pain except to her left lower extremity where she has suffered a fracture recently left ankle and tibia and fibula. Last dose of eliquis was yesterday.   Past Medical History  Diagnosis Date  . CHF (congestive heart failure) ([place])   . Thyroid disease   . Diabetes mellitus without complication ([place])   . Hypertension   . Hyperlipidemia   . Arrhythmia     PVC  . COPD (chronic obstructive pulmonary disease) ([place])   . Chronic kidney disease   . OSA (obstructive sleep apnea) 2011  . Anemia   . Anxiety and depression     [name]  . Lymphoma ([place])   . Osteoarthritis   . Lymphedema     Patient Active Problem List   Diagnosis Date Noted  . Episodes of formed visual hallucinations 12/18/2015  . NSTEMI (non-ST elevated myocardial infarction) ([place]) 12/15/2015  . Acute blood loss anemia   . Hematochezia   . Acute respiratory failure with hypoxia ([place]) 01/01/2015  . Chronic diastolic heart failure ([place]) 11/01/2014    Past Surgical History  Procedure Laterality Date  . Tonsillectomy and adenoidectomy    . Appendectomy    . Knee surgery Left   . Abdominal hysterectomy    . Cardiac  catheterization    . Coronary angioplasty      x4  . Cardiac stents      Current Outpatient Rx  Name  Route  Sig  Dispense  Refill  . acetaminophen (TYLENOL) 500 MG tablet   Oral   Take 1,000 mg by mouth every 6 (six) hours as needed.         Marland Kitchen albuterol (PROVENTIL HFA;VENTOLIN HFA) 108 (90 BASE) MCG/ACT inhaler   Inhalation   Inhale 1 puff into the lungs every 4 (four) hours as needed for wheezing or shortness of breath.          Marland Kitchen albuterol (PROVENTIL) (2.5 MG/3ML) 0.083% nebulizer solution   Nebulization   Take 2.5 mg by nebulization every 6 (six) hours as needed for wheezing or shortness of breath.         Marland Kitchen amLODipine (NORVASC) 5 MG tablet   Oral   Take 5 mg by mouth daily.         Marland Kitchen apixaban (ELIQUIS) 2.5 MG TABS tablet   Oral   Take 2.5 mg by mouth 2 (two) times daily.         Marland Kitchen atorvastatin (LIPITOR) 80 MG tablet   Oral   Take 80 mg by mouth daily.         . baclofen (LIORESAL) 10 MG tablet   Oral   Take 5 mg by mouth at bedtime as needed for muscle spasms.         Marland Kitchen  bisacodyl (DULCOLAX) 5 MG EC tablet   Oral   Take 5 mg by mouth daily as needed for moderate constipation.         . calcitRIOL (ROCALTROL) 0.25 MCG capsule   Oral   Take 0.25 mcg by mouth every Monday, Wednesday, and Friday.         . Cholecalciferol 1000 UNITS tablet   Oral   Take 1,000 Units by mouth daily.         Marland Kitchen docusate sodium (COLACE) 100 MG capsule   Oral   Take 100 mg by mouth daily as needed for mild constipation or moderate constipation.          Marland Kitchen esomeprazole (NEXIUM) 20 MG capsule   Oral   Take 20 mg by mouth daily as needed (for acid reflux).         . fluticasone (FLONASE) 50 MCG/ACT nasal spray   Each Nare   Place 1 spray into both nostrils daily.         . Fluticasone-Salmeterol (ADVAIR) 250-50 MCG/DOSE AEPB   Inhalation   Inhale 1 puff into the lungs 2 (two) times daily.         . furosemide (LASIX) 40 MG tablet   Oral   Take 40 mg  by mouth daily.         Marland Kitchen gabapentin (NEURONTIN) 300 MG capsule   Oral   Take 300 mg by mouth 2 (two) times daily.         . insulin glargine (LANTUS) 100 UNIT/ML injection   Subcutaneous   Inject 50 Units into the skin at bedtime.          . insulin lispro (HUMALOG) 100 UNIT/ML injection   Subcutaneous   Inject 20-26 Units into the skin 3 (three) times daily before meals. Inject 20 units sub-q in morning, inject 26 units sub-q in afternoon, and inject 20 units sub-q in evening         . latanoprost (XALATAN) 0.005 % ophthalmic solution   Both Eyes   Place 1 drop into both eyes at bedtime.         Marland Kitchen levothyroxine (SYNTHROID, LEVOTHROID) 137 MCG tablet   Oral   Take 137-274 mcg by mouth daily before breakfast. Take 192mcg (1 tablet) daily on Mon, Tues, Wed, Thurs, Sat, and Sun. Take 268mcg (2 tabs) daily on Friday. (Take with a glass of water at least 30 to 60 minutes before breakfast)         . metoprolol tartrate (LOPRESSOR) 25 MG tablet   Oral   Take 0.5 tablets (12.5 mg total) by mouth 2 (two) times daily.   30 tablet   0   . montelukast (SINGULAIR) 10 MG tablet   Oral   Take 10 mg by mouth at bedtime.         . nitroGLYCERIN (NITROSTAT) 0.4 MG SL tablet   Sublingual   Place 0.4 mg under the tongue every 5 (five) minutes as needed for chest pain.         Marland Kitchen nystatin-triamcinolone (MYCOLOG II) cream   Topical   Apply 1 application topically 2 (two) times daily.         . polyethylene glycol (MIRALAX / GLYCOLAX) packet   Oral   Take 17 g by mouth daily as needed.         . senna-docusate (SENOKOT-S) 8.6-50 MG tablet   Oral   Take 2 tablets by mouth 2 (two) times daily as needed  for mild constipation.         Marland Kitchen tetrahydrozoline 0.05 % ophthalmic solution   Both Eyes   Place 1 drop into both eyes daily as needed.         . venlafaxine (EFFEXOR) 75 MG tablet   Oral   Take 75 mg by mouth 2 (two) times daily.            Allergies Amlodipine; Oxycodone; Demerol; Lisinopril; and Tape  Family History  Problem Relation Age of Onset  . Aneurysm Mother   . Stroke Mother   . Liver cancer Father     Social History Social History  Substance Use Topics  . Smoking status: Former Smoker -- 1.50 packs/day for 18 years    Quit date: 06/25/1975  . Smokeless tobacco: Never Used  . Alcohol Use: No    Review of Systems Constitutional: No fever/chills Eyes: No visual changes. ENT: No sore throat. Cardiovascular: Denies chest pain. Respiratory: Denies shortness of breath. Gastrointestinal: No abdominal pain.  No nausea, no vomiting.   Genitourinary: Negative for dysuria. Musculoskeletal: Negative for back pain. Skin: Negative for rash. Neurological: Negative for headaches, focal weakness or numbness.  10-point ROS otherwise negative.  ____________________________________________   PHYSICAL EXAM:  VITAL SIGNS: ED Triage Vitals  Enc Vitals Group     BP 12/25/15 1718 112/39 mmHg     Pulse Rate 12/25/15 1718 78     Resp 12/25/15 1718 18     Temp 12/25/15 1718 97.5 F (36.4 C)     Temp Source 12/25/15 1718 Oral     SpO2 12/25/15 1718 95 %     Weight 12/25/15 1718 240 lb (108.863 kg)     Height 12/25/15 1718 5\' 4"  (1.626 m)     Head Cir --      Peak Flow --      Pain Score 12/25/15 1720 0     Pain Loc --      Pain Edu? --      Excl. in [place]? --     Constitutional: Alert and oriented. Well appearing and in no acute distress. Eyes: Conjunctivae are normal. PERRL. EOMI. Head: Atraumatic. Nose: No congestion/rhinnorhea. Mouth/Throat: Mucous membranes are moist.   Neck: No stridor.    Cardiovascular: Irregularly irregular. Grossly normal heart sounds.   Respiratory: Normal respiratory effort.  No retractions. Lungs CTAB. Gastrointestinal: Soft and nontender. No distention. Digital rectal exam with hematochezia at this time. Musculoskeletal: Mild bilateral lower extremity edema. Wearing a boot  to the left lower extremity that extends to just distal to left knee. Neurovascularly intact to left foot and says that but does not feel too tight. Neurologic:  Normal speech and language. No gross focal neurologic deficits are appreciated.  Skin:  Skin is warm, dry and intact. No rash noted. Psychiatric: Mood and affect are normal. Speech and behavior are normal.  ____________________________________________   LABS (all labs ordered are listed, but only abnormal results are displayed)  Labs Reviewed  COMPREHENSIVE METABOLIC PANEL - Abnormal; Notable for the following:    BUN 45 (*)    Creatinine, Ser 1.72 (*)    Calcium 8.5 (*)    Total Protein 6.3 (*)    Albumin 3.1 (*)    GFR calc non Af Amer 26 (*)    GFR calc Af Amer 30 (*)    All other components within normal limits  CBC - Abnormal; Notable for the following:    RBC 2.19 (*)    Hemoglobin 6.7 (*)  HCT 21.1 (*)    MCHC 31.7 (*)    RDW 18.3 (*)    All other components within normal limits  TROPONIN I  TYPE AND SCREEN   ____________________________________________  EKG  ED ECG REPORT I, [place] Rogacki,  Youlanda Roys, the attending physician, personally viewed and interpreted this ECG.   Date: 12/25/2015  EKG Time: 1715  Rate: 74  Rhythm: atrial fibrillation  Axis: Normal axis  Intervals:none  ST&T Change: T wave inversions in V4 through V6. No significant change from EKG done on 12/15/2015. ____________________________________________  RADIOLOGY   ____________________________________________   PROCEDURES    Procedures  Critical Care performed: Yes, see critical care note(s) CRITICAL CARE Performed by: Doran Stabler   Total critical care time: 35 minutes  Critical care time was exclusive of separately billable procedures and treating other patients.  Critical care was necessary to treat or prevent imminent or life-threatening deterioration.  Critical care was time spent personally by me on the  following activities: development of treatment plan with patient and/or surrogate as well as nursing, discussions with consultants, evaluation of patient's response to treatment, examination of patient, obtaining history from patient or surrogate, ordering and performing treatments and interventions, ordering and review of laboratory studies, ordering and review of radiographic studies, pulse oximetry and re-evaluation of patient's condition.  ____________________________________________   INITIAL IMPRESSION / ASSESSMENT AND PLAN / ED COURSE  Pertinent labs & imaging results that were available during my care of the patient were reviewed by me and considered in my medical decision making (see chart for details).  ----------------------------------------- 6:26 PM on 12/25/2015 -----------------------------------------  Patient will be admitted to the hospital. Found to have a gastrointestinal bleed with a low hemoglobin. Will require blood transfusion. Discussed case with gastroenterology, Dr. Gustavo Lah, who says that he will see the patient as a consult tomorrow. The patient was signed out to Dr. Edwina Barth. Because of the black stools the patient was started on Protonix. The patient understands the plan for admission and is willing to comply. ____________________________________________   FINAL CLINICAL IMPRESSION(S) / ED DIAGNOSES  Acute gastrointestinal bleeding. Symptomatic anemia.    NEW MEDICATIONS STARTED DURING THIS VISIT:  New Prescriptions   No medications on file     Note:  This document was prepared using Dragon voice recognition software and may include unintentional dictation errors.    Orbie Pyo, MD 12/25/15 [phone]

## 2015-12-25 NOTE — ED Notes (Signed)
Admitting MD in to see patient, ERMD and admitting notified of elevated troponin.

## 2015-12-25 NOTE — ED Notes (Signed)
Low HGB per report, drawn today. Patient states last black stool was yesterday. States has had intermittent black stools since oct 2016. Took Eloquis til yesterday. Takes Eloquis for atrial fib. Monitor does show a fib. Patient has boot L foot which she states is for fracture, states she did not have surgery.

## 2015-12-26 LAB — BASIC METABOLIC PANEL
ANION GAP: 8 (ref 5–15)
BUN: 39 mg/dL — ABNORMAL HIGH (ref 6–20)
CALCIUM: 8.7 mg/dL — AB (ref 8.9–10.3)
CO2: 23 mmol/L (ref 22–32)
Chloride: 113 mmol/L — ABNORMAL HIGH (ref 101–111)
Creatinine, Ser: 1.63 mg/dL — ABNORMAL HIGH (ref 0.44–1.00)
GFR, EST AFRICAN AMERICAN: 33 mL/min — AB (ref >60)
GFR, EST NON AFRICAN AMERICAN: 28 mL/min — AB (ref >60)
Glucose, Bld: 136 mg/dL — ABNORMAL HIGH (ref 65–99)
POTASSIUM: 4.6 mmol/L (ref 3.5–5.1)
Sodium: 144 mmol/L (ref 135–145)

## 2015-12-26 LAB — GLUCOSE, CAPILLARY
GLUCOSE-CAPILLARY: 104 mg/dL — AB (ref 65–99)
GLUCOSE-CAPILLARY: 231 mg/dL — AB (ref 65–99)
Glucose-Capillary: 96 mg/dL (ref 65–99)
Glucose-Capillary: 129 mg/dL — ABNORMAL HIGH (ref 65–99)
Glucose-Capillary: 179 mg/dL — ABNORMAL HIGH (ref 65–99)
Glucose-Capillary: 233 mg/dL — ABNORMAL HIGH (ref 65–99)

## 2015-12-26 LAB — CBC
HCT: 28.2 % — ABNORMAL LOW (ref 35.0–47.0)
Hemoglobin: 9.3 g/dL — ABNORMAL LOW (ref 12.0–16.0)
MCH: 30 pg (ref 26.0–34.0)
MCHC: 33.1 g/dL (ref 32.0–36.0)
MCV: 90.8 fL (ref 80.0–100.0)
Platelets: 163 K/uL (ref 150–440)
RBC: 3.1 MIL/uL — ABNORMAL LOW (ref 3.80–5.20)
RDW: 18.3 % — ABNORMAL HIGH (ref 11.5–14.5)
WBC: 7.8 K/uL (ref 3.6–11.0)

## 2015-12-26 LAB — TROPONIN I: Troponin I: 0.05 ng/mL

## 2015-12-26 MED ORDER — INSULIN ASPART 100 UNIT/ML ~~LOC~~ SOLN
0.0000 [IU] | Freq: Four times a day (QID) | SUBCUTANEOUS | Status: DC
  Administered 2015-12-27: 5 [IU] via SUBCUTANEOUS
  Administered 2015-12-28: 3 [IU] via SUBCUTANEOUS
  Administered 2015-12-28: 5 [IU] via SUBCUTANEOUS
  Administered 2015-12-28: 2 [IU] via SUBCUTANEOUS
  Filled 2015-12-26: qty 5
  Filled 2015-12-26: qty 3
  Filled 2015-12-26: qty 5
  Filled 2015-12-26: qty 2

## 2015-12-26 MED ORDER — NYSTATIN 100000 UNIT/GM EX POWD
Freq: Two times a day (BID) | CUTANEOUS | Status: DC
  Administered 2015-12-26 – 2016-01-04 (×16): via TOPICAL
  Filled 2015-12-26 (×2): qty 15

## 2015-12-26 NOTE — Progress Notes (Signed)
[facility] [place] NAME: Jill York    MR#:  [id number]  DATE OF BIRTH:  1930-10-28  SUBJECTIVE:  CHIEF COMPLAINT:   Chief Complaint  Patient presents with  . Abnormal Lab   Admitted for GI bleed. No further bleeding noticed. No shortness of breath or chest pain or abdominal pain. .   Review of Systems  Constitutional: Negative for fever and chills.  HENT: Negative for sore throat.   Eyes: Negative for blurred vision, double vision and pain.  Respiratory: Negative for cough, hemoptysis, shortness of breath and wheezing.   Cardiovascular: Negative for chest pain, palpitations, orthopnea and leg swelling.  Gastrointestinal: Negative for heartburn, nausea, vomiting, abdominal pain, diarrhea and constipation.  Genitourinary: Negative for dysuria and hematuria.  Musculoskeletal: Negative for back pain and joint pain.  Skin: Negative for rash.  Neurological: Negative for sensory change, speech change, focal weakness and headaches.  Endo/Heme/Allergies: Does not bruise/bleed easily.  Psychiatric/Behavioral: Negative for depression. The patient is not nervous/anxious.     VITAL SIGNS: Blood pressure 146/57, pulse 76, temperature 98.1 F (36.7 C), temperature source Oral, resp. rate 14, height 5\' 4"  (1.626 m), weight 116.62 kg (257 lb 1.6 oz), SpO2 94 %.  PHYSICAL EXAMINATION:   GENERAL:  82 y.o.-year-old patient lying in the bed with no acute distress. Obese EYES: Pupils equal, round, reactive to light and accommodation. No scleral icterus. Extraocular muscles intact.  HEENT: Head atraumatic, normocephalic. Oropharynx and nasopharynx clear.  NECK:  Supple, no jugular venous distention. No thyroid enlargement, no tenderness.  LUNGS: Normal breath sounds bilaterally, no wheezing, rales,rhonchi or crepitation. No use of accessory muscles of respiration.  CARDIOVASCULAR: Irregular.No murmurs, rubs, or gallops.  ABDOMEN: Soft,  nontender, nondistended. Bowel sounds present. No organomegaly or mass.  EXTREMITIES: 1-2+ lower extremity and pedal edema, no cyanosis, or clubbing. Left ankle tender in medial aspect and with extension.  NEUROLOGIC: Cranial nerves II through XII are grossly intact  Muscle strength 5 out of 5 in all extremities.  Gait not checked.  PSYCHIATRIC: The patient is awake and alert SKIN: No obvious rash, lesion, or ulcer.  Brace on LLE  ORDERS/RESULTS REVIEWED:   CBC  Recent Labs Lab 12/20/15 0827 12/20/15 1259 12/25/15 1730 12/26/15 0950  WBC  --   --  7.5 7.8  HGB 7.9* 8.2* 6.7* 9.3*  HCT  --   --  21.1* 28.2*  PLT  --   --  179 163  MCV  --   --  96.4 90.8  MCH  --   --  30.6 30.0  MCHC  --   --  31.7* 33.1  RDW  --   --  18.3* 18.3*   ------------------------------------------------------------------------------------------------------------------  Chemistries   Recent Labs Lab 12/25/15 1730 12/26/15 0950  NA 141 144  K 4.2 4.6  CL 107 113*  CO2 27 23  GLUCOSE 91 136*  BUN 45* 39*  CREATININE 1.72* 1.63*  CALCIUM 8.5* 8.7*  AST 23  --   ALT 25  --   ALKPHOS 72  --   BILITOT 0.4  --    ------------------------------------------------------------------------------------------------------------------ estimated creatinine clearance is 32.8 mL/min (by C-G formula based on Cr of 1.63). ------------------------------------------------------------------------------------------------------------------ No results for input(s): TSH, T4TOTAL, T3FREE, THYROIDAB in the last 72 hours.  Invalid input(s): FREET3  Cardiac Enzymes  Recent Labs Lab 12/25/15 1730 12/25/15 2038 12/26/15 0950  TROPONINI 0.04* 0.05* 0.05*   ------------------------------------------------------------------------------------------------------------------ Invalid input(s):  POCBNP ---------------------------------------------------------------------------------------------------------------  RADIOLOGY:  No results found.  EKG:  Orders placed or performed during the hospital encounter of 12/15/15  . ED EKG  . ED EKG    ASSESSMENT AND PLAN:  Active Problems:   Acute blood loss anemia #1. Acute on chronic posthemorrhagic symptomatic anemia Due to GI bleed status post 2 unit of packed red blood cell transfusion Hb Improved to 9.3 No further bleeding noticed EGD likely tomorrow Hold Eliquis  #2. Elevated troponin due to demand ischemia Seen by cardiology during previous admission with much higher troponins. No further investigation advised. Repeat troponin stable  #3. CKD 3 Stable  #4. Left Comminuted fracture of the lateral malleolus Brace in place  #5 atrial fibrillation Eliquis on hold  DRUG ALLERGIES:  Allergies  Allergen Reactions  . Amlodipine Swelling    Leg swelling only   . Oxycodone Other (See Comments)    Reaction: patient states she was "out" for 2 hours and did not notice people in the area    . Demerol [Meperidine] Diarrhea and Nausea And Vomiting  . Lisinopril Cough  . Tape Rash    CODE STATUS:     Code Status Orders        Start     Ordered   12/15/15 1200  Do not attempt resuscitation (DNR)   Continuous    Question Answer Comment  In the event of cardiac or respiratory ARREST Do not call a "code blue"   In the event of cardiac or respiratory ARREST Do not perform Intubation, CPR, defibrillation or ACLS   In the event of cardiac or respiratory ARREST Use medication by any route, position, wound care, and other measures to relive pain and suffering. May use oxygen, suction and manual treatment of airway obstruction as needed for comfort.      12/15/15 1159    Code Status History    Date Active Date Inactive Code Status Order ID Comments User Context   01/01/2015 10:29 AM 01/04/2015  1:43 PM DNR [id number]  Loletha Grayer, MD ED    Advance Directive Documentation        Most Recent Value   Type of Advance Directive  Healthcare Power of Attorney   Pre-existing out of facility DNR order (yellow form or pink MOST form)     "MOST" Form in Place?        TOTAL TIME TAKING CARE OF THIS PATIENT: 35 minutes.   Hillary Bow R M.D on 12/26/2015 at 1:06 PM  Between 7am to 6pm - Pager - [phone]  After 6pm go to [email or web address] - password [email or web address] [facility]  Office  [phone]  CC: Primary care physician; Sharyne Peach, MD

## 2015-12-26 NOTE — Care Management (Addendum)
Patient is readmitted with weakness and black stools.  her hemoglobin found to be 6.7. Two units of packed cells ordered.  Eliquis is on hold.  GI consult is pending. Patient is from [facility] skilled nursing.  CSW referral

## 2015-12-26 NOTE — Consult Note (Signed)
GI Inpatient Consult Note Lollie Sails MD  Reason for Consult: GI bleeding   Attending Requesting Consult: Dr. Meade Maw  Outpatient Primary Physician:   History of Present Illness: Jill York is a 82 y.o. female admitted to the hospital yesterday anemia. Patient states that she had a black stool last Thursday, 6 days ago and perhaps one the following day, 5 days ago. Her daughter wanted her to have further evaluation in this regard and she came to the emergency room yesterday. She did have some weakness. She has not seen a black stool for at least 5 days. He has been taking Eliquis her last dose within the last 48 hours. She does have cardiac stent. She has had 2 stools reported since hospitalization these being brown.  She denies any current nausea vomiting or abdominal pain there is no dysphagia. She occasionally has some heartburn for which she takes Nexium. She does not take a proton pump inhibitor regularly. She has been on dual antiplatelet therapy with aspirin as well as eliquis.  She has a bowel movement usually daily but does take a stool softener on a daily basis. She was apparently hospitalized at [facility] around 03/09/2015. At that time she had some GI bleeding and underwent EGD and colonoscopy. I have not been able to find result of EGD but was apparently normal from other notes. She did have 5 polyps removed during the colonoscopy.   Past Medical History:  Past Medical History  Diagnosis Date  . CHF (congestive heart failure) ([place])   . Thyroid disease   . Diabetes mellitus without complication ([place])   . Hypertension   . Hyperlipidemia   . Arrhythmia     PVC  . COPD (chronic obstructive pulmonary disease) ([place])   . Chronic kidney disease   . OSA (obstructive sleep apnea) 2011  . Anemia   . Anxiety and depression     [name]  . Lymphoma ([place])   . Osteoarthritis   . Lymphedema     Problem List: Patient Active Problem  List   Diagnosis Date Noted  . Episodes of formed visual hallucinations 12/18/2015  . NSTEMI (non-ST elevated myocardial infarction) ([place]) 12/15/2015  . Acute blood loss anemia   . Hematochezia   . Acute respiratory failure with hypoxia ([place]) 01/01/2015  . Chronic diastolic heart failure ([place]) 11/01/2014    Past Surgical History: Past Surgical History  Procedure Laterality Date  . Tonsillectomy and adenoidectomy    . Appendectomy    . Knee surgery Left   . Abdominal hysterectomy    . Cardiac catheterization    . Coronary angioplasty      x4  . Cardiac stents      Allergies: Allergies  Allergen Reactions  . Amlodipine Swelling    Leg swelling only   . Oxycodone Other (See Comments)    Reaction: patient states she was "out" for 2 hours and did not notice people in the area    . Demerol [Meperidine] Diarrhea and Nausea And Vomiting  . Lisinopril Cough  . Tape Rash    Home Medications: Prescriptions prior to admission  Medication Sig Dispense Refill Last Dose  . acetaminophen (TYLENOL) 500 MG tablet Take 1,000 mg by mouth every 6 (six) hours as needed.   prn at prn  . albuterol (PROVENTIL HFA;VENTOLIN HFA) 108 (90 BASE) MCG/ACT inhaler Inhale 1 puff into the lungs every 4 (four) hours as needed for wheezing or shortness of breath.  prn at prn  . albuterol (PROVENTIL) (2.5 MG/3ML) 0.083% nebulizer solution Take 2.5 mg by nebulization every 6 (six) hours as needed for wheezing or shortness of breath.   prn at prn  . amLODipine (NORVASC) 5 MG tablet Take 5 mg by mouth daily.   12/25/2015 at 0800  . apixaban (ELIQUIS) 2.5 MG TABS tablet Take 2.5 mg by mouth 2 (two) times daily.   12/25/2015 at 0800  . atorvastatin (LIPITOR) 80 MG tablet Take 80 mg by mouth daily.   12/24/2015 at 2000  . baclofen (LIORESAL) 10 MG tablet Take 5 mg by mouth at bedtime as needed for muscle spasms.   prn at prn  . bisacodyl (DULCOLAX) 5 MG EC tablet Take 5 mg by mouth daily as needed for moderate  constipation.   prn at prn  . calcitRIOL (ROCALTROL) 0.25 MCG capsule Take 0.25 mcg by mouth every Monday, Wednesday, and Friday.   12/25/2015 at 0800  . Cholecalciferol 1000 UNITS tablet Take 1,000 Units by mouth daily.   12/25/2015 at 0800  . docusate sodium (COLACE) 100 MG capsule Take 100 mg by mouth daily as needed for mild constipation or moderate constipation.    prn at prn  . esomeprazole (NEXIUM) 20 MG capsule Take 20 mg by mouth daily as needed (for acid reflux).   prn at prn  . fluticasone (FLONASE) 50 MCG/ACT nasal spray Place 1 spray into both nostrils daily.   12/25/2015 at 0800  . Fluticasone-Salmeterol (ADVAIR) 250-50 MCG/DOSE AEPB Inhale 1 puff into the lungs 2 (two) times daily.   12/25/2015 at 0800  . furosemide (LASIX) 40 MG tablet Take 40 mg by mouth daily.   12/25/2015 at 0800  . gabapentin (NEURONTIN) 300 MG capsule Take 300 mg by mouth 2 (two) times daily.   12/25/2015 at 0800  . insulin glargine (LANTUS) 100 UNIT/ML injection Inject 54 Units into the skin at bedtime.    12/24/2015 at 2000  . insulin lispro (HUMALOG) 100 UNIT/ML injection Inject 20-26 Units into the skin 3 (three) times daily with meals. Inject 20 units sub-q in morning, inject 26 units sub-q in afternoon, and inject 20 units sub-q in evening   12/25/2015 at 1100  . latanoprost (XALATAN) 0.005 % ophthalmic solution Place 1 drop into both eyes at bedtime.   12/24/2015 at 2000  . levothyroxine (SYNTHROID, LEVOTHROID) 137 MCG tablet Take 137-274 mcg by mouth daily before breakfast. Take 146mcg (1 tablet) daily on Mon, Tues, Wed, Thurs, Sat, and Sun. Take 226mcg (2 tabs) daily on Friday. (Take with a glass of water at least 30 to 60 minutes before breakfast)   12/25/2015 at 0630  . losartan (COZAAR) 25 MG tablet Take 25 mg by mouth daily.   12/25/2015 at 0800  . metoprolol tartrate (LOPRESSOR) 25 MG tablet Take 0.5 tablets (12.5 mg total) by mouth 2 (two) times daily. 30 tablet 0 12/25/2015 at 0800  . montelukast (SINGULAIR) 10 MG tablet  Take 10 mg by mouth at bedtime.   12/24/2015 at 2000  . nitroGLYCERIN (NITROSTAT) 0.4 MG SL tablet Place 0.4 mg under the tongue every 5 (five) minutes as needed for chest pain.   prn at prn  . polyethylene glycol (MIRALAX / GLYCOLAX) packet Take 17 g by mouth daily as needed.   prn at prn  . senna-docusate (SENOKOT-S) 8.6-50 MG tablet Take 2 tablets by mouth daily.    12/25/2015 at 0800  . tetrahydrozoline 0.05 % ophthalmic solution Place 1 drop into both eyes daily  as needed.   prn at prn  . venlafaxine (EFFEXOR) 75 MG tablet Take 75 mg by mouth 2 (two) times daily.   12/25/2015 at 0800   Home medication reconciliation was completed with the patient.   Scheduled Inpatient Medications:   . fluticasone  1 spray Each Nare Daily  . furosemide  40 mg Oral Daily  . gabapentin  300 mg Oral BID  . insulin aspart  0-15 Units Subcutaneous TID WC  . insulin aspart  0-5 Units Subcutaneous QHS  . insulin glargine  54 Units Subcutaneous QHS  . latanoprost  1 drop Both Eyes QHS  . levothyroxine  137 mcg Oral Once per day on Sun Mon Tue Wed Thu Sat  . [START ON 12/29/2015] levothyroxine  274 mcg Oral Q Fri  . losartan  25 mg Oral Daily  . metoprolol tartrate  12.5 mg Oral BID  . mometasone-formoterol  2 puff Inhalation BID  . montelukast  10 mg Oral QHS  . [START ON 12/29/2015] pantoprazole  40 mg Intravenous Q12H  . sodium chloride flush  3 mL Intravenous Q12H  . venlafaxine  75 mg Oral BID    Continuous Inpatient Infusions:   . sodium chloride    . pantoprozole (PROTONIX) infusion 8 mg/hr (12/26/15 0346)    PRN Inpatient Medications:  acetaminophen, albuterol, baclofen, nitroGLYCERIN  Family History: family history includes Aneurysm in her mother; Liver cancer in her father; Stroke in her mother.   GI Family History: Father with pancreatic cancer, nephew with alcohol-related hepatocellular cancer of the liver.  Social History:   reports that she quit smoking about 40 years ago. She has never  used smokeless tobacco. She reports that she does not drink alcohol or use illicit drugs. The patient denies ETOH, tobacco, or drug use.   ROS  Review of Systems: 10 systems reviewed per admission history and physical agree with same.  Physical Examination: BP 146/57 mmHg  Pulse 76  Temp(Src) 98.1 F (36.7 C) (Oral)  Resp 14  Ht 5\' 4"  (1.626 m)  Wt 116.62 kg (257 lb 1.6 oz)  BMI 44.11 kg/m2  SpO2 66% Gen: 82 year old female no acute distress HEENT: Normocephalic atraumatic eyes are anicteric Neck: Supple no JVD Chest: Clear to auscultation CV: Irregular rate / rhythm Abd: Soft nontender nondistended bowel sounds positive normoactive Ext: 1+ bilateral lower extremity edema Skin: Cool and dry Other:  Data: Lab Results  Component Value Date   WBC 7.8 12/26/2015   HGB 9.3* 12/26/2015   HCT 28.2* 12/26/2015   MCV 90.8 12/26/2015   PLT 163 12/26/2015    Recent Labs Lab 12/20/15 1259 12/25/15 1730 12/26/15 0950  HGB 8.2* 6.7* 9.3*   Lab Results  Component Value Date   NA 144 12/26/2015   K 4.6 12/26/2015   CL 113* 12/26/2015   CO2 23 12/26/2015   BUN 39* 12/26/2015   CREATININE 1.63* 12/26/2015   Lab Results  Component Value Date   ALT 25 12/25/2015   AST 23 12/25/2015   ALKPHOS 72 12/25/2015   BILITOT 0.4 12/25/2015   No results for input(s): APTT, INR, PTT in the last 168 hours. CBC Latest Ref Rng 12/26/2015 12/25/2015 12/20/2015  WBC 3.6 - 11.0 K/uL 7.8 7.5 -  Hemoglobin 12.0 - 16.0 g/dL 9.3(L) 6.7(L) 8.2(L)  Hematocrit 35.0 - 47.0 % 28.2(L) 21.1(L) -  Platelets 150 - 440 K/uL 163 179 -    STUDIES: No results found. @IMAGES @  Assessment:1. Symptomatic anemia, black stool 5-6 days ago. Patient  has been hemodynamically stable. She has been on dual antiplatelet therapy due to her history of coronary stents with aspirin and eliquis, both of these currently held.  Recommendations: We'll arrange for EGD. I am uncertain if I will be able get this done tomorrow  but hopefully at least by Thursday. I have discussed the risks benefits and complications of procedures to include not limited to bleeding, infection, perforation and the risk of sedation and the patient wishes to proceed.  Thank you for the consult. Please call with questions or concerns.  Lollie Sails, MD  12/26/2015 12:03 PM

## 2015-12-26 NOTE — Clinical Social Work Note (Signed)
Clinical Social Work Assessment  Patient Details  Name: Jill York MRN: [id number] Date of Birth: [birth year]  Date of referral:  12/26/15               Reason for consult:  Facility Placement                Permission sought to share information with:  Family Supports Permission granted to share information::  Yes, Verbal Permission Granted  Name::     Jill York  Relationship::  daughter  Contact Information:  [phone]  Housing/Transportation Living arrangements for the past 2 months:  [place], [place] of Information:  Patient, Adult Children Patient Interpreter Needed:  None Criminal Activity/Legal Involvement Pertinent to Current Situation/Hospitalization:  No - Comment as needed Significant Relationships:  Adult Children Lives with:  Facility Resident Do you feel safe going back to the place where you live?  Yes Need for family participation in patient care:  Yes (Comment)  Care giving concerns:  No care giving concerns identified.   Social Worker assessment / plan:  CSW met with pt to address consult as pt was admitted from [facility]. CSW introduced herself and explained role of social work. CSW also explained the process of returning to facility when medically stable. CSW spoke with facility and pt is able to return at discharge to continue her STR. Pt is agreeable to this. With pt's permission, CSW spoke with pt's daughter. Pt's daughter would like for pt return to facility at discharge. CSW will continue to follow.   Employment status:  Retired Forensic scientist:  Medicare PT Recommendations:  Not assessed at this time [place] / Referral to community resources:  Other (Comment Required) [facility])  Patient/Family's Response to care:  Pt and daughter were appreciative of CSW support.   Patient/Family's Understanding of and Emotional Response to Diagnosis, Current Treatment, and Prognosis:  Pt and daughter would like  for pt to continue STR.   Emotional Assessment Appearance:  Appears stated age Attitude/Demeanor/Rapport:  Other (Appropriate) Affect (typically observed):  Accepting, Pleasant Orientation:  Oriented to Self, Oriented to Place, Oriented to  Time, Oriented to Situation Alcohol / Substance use:  Never Used Psych involvement (Current and /or in the community):  No (Comment)  Discharge Needs  Concerns to be addressed:  No discharge needs identified Readmission within the last 30 days:  Yes Current discharge risk:  None Barriers to Discharge:  Continued Medical Work up   [employer], LCSW 12/26/2015, 11:14 AM

## 2015-12-26 NOTE — Progress Notes (Signed)
Spoke with Dr Marcille Blanco. It is okay for labs to be drawn at completion of second unit of PRBCs.

## 2015-12-26 NOTE — NC FL2 (Signed)
[place] LEVEL OF CARE SCREENING TOOL     IDENTIFICATION  Patient Name: Jill York Birthdate: 03/17/1931 Sex: female Admission Date (Current Location): 12/25/2015  [place] and [place] Number:  Engineering geologist and Address:  [facility], [address], [place], [place] [address]      Provider Number: [id number]  Attending Physician Name and Address:  Hillary Bow, MD  Relative Name and Phone Number:       Current Level of Care: Hospital Recommended Level of Care: [place] Prior Approval Number:    Date Approved/Denied:   PASRR Number: [id number]  Discharge Plan: SNF    Current Diagnoses: Patient Active Problem List   Diagnosis Date Noted  . Episodes of formed visual hallucinations 12/18/2015  . NSTEMI (non-ST elevated myocardial infarction) ([place]) 12/15/2015  . Acute blood loss anemia   . Hematochezia   . Acute respiratory failure with hypoxia ([place]) 01/01/2015  . Chronic diastolic heart failure ([place]) 11/01/2014    Orientation RESPIRATION BLADDER Height & Weight     Self, Time, Situation, Place  Normal Continent Weight: 257 lb 1.6 oz (116.62 kg) Height:  5\' 4"  (162.6 cm)  BEHAVIORAL SYMPTOMS/MOOD NEUROLOGICAL BOWEL NUTRITION STATUS   (None)  (None) Continent Diet (NPO except sips with meds)  AMBULATORY STATUS COMMUNICATION OF NEEDS Skin   Extensive Assist Verbally Normal                       Personal Care Assistance Level of Assistance  Bathing, Feeding, Dressing Bathing Assistance: Limited assistance Feeding assistance: Independent Dressing Assistance: Limited assistance     Functional Limitations Info  Sight, Hearing, Speech Sight Info: Adequate Hearing Info: Adequate Speech Info: Adequate    SPECIAL CARE FACTORS FREQUENCY  PT (By licensed PT)     PT Frequency: 5              Contractures      Additional Factors Info  Insulin Sliding Scale, Code Status Code Status  Info: DNR Allergies Info: Amlodipine, Oxycodone, Demerol, Lisinopril, Tape   Insulin Sliding Scale Info: insulin glargine (LANTUS) 100 UNIT/ML injectionInject 50 Units into the skin at bedtime. [place] Provider, MD       Current Medications (12/26/2015):  This is the current hospital active medication list Current Facility-Administered Medications  Medication Dose Route Frequency Provider Last Rate Last Dose  . 0.9 %  sodium chloride infusion   Intravenous Continuous Baxter Hire, MD   0  at 12/25/15 2309  . acetaminophen (TYLENOL) tablet 650 mg  650 mg Oral Q6H PRN Baxter Hire, MD   650 mg at 12/25/15 2316  . albuterol (PROVENTIL) (2.5 MG/3ML) 0.083% nebulizer solution 2.5 mg  2.5 mg Nebulization Q6H PRN Baxter Hire, MD      . baclofen (LIORESAL) tablet 5 mg  5 mg Oral QHS PRN Baxter Hire, MD      . fluticasone [facility]) 50 MCG/ACT nasal spray 1 spray  1 spray Each Nare Daily Baxter Hire, MD   1 spray at 12/26/15 [phone]  . furosemide (LASIX) tablet 40 mg  40 mg Oral Daily Baxter Hire, MD   40 mg at 12/26/15 0953  . gabapentin (NEURONTIN) capsule 300 mg  300 mg Oral BID Baxter Hire, MD   300 mg at 12/26/15 [id number]  . insulin aspart (novoLOG) injection 0-15 Units  0-15 Units Subcutaneous TID WC Baxter Hire, MD   0 Units at  12/26/15 [id number]  . insulin aspart (novoLOG) injection 0-5 Units  0-5 Units Subcutaneous QHS Baxter Hire, MD   0 Units at 12/25/15 2308  . insulin glargine (LANTUS) injection 54 Units  54 Units Subcutaneous QHS Baxter Hire, MD   54 Units at 12/25/15 2234  . latanoprost (XALATAN) 0.005 % ophthalmic solution 1 drop  1 drop Both Eyes QHS Baxter Hire, MD   1 drop at 12/25/15 2253  . levothyroxine (SYNTHROID, LEVOTHROID) tablet 137 mcg  137 mcg Oral Once per day on Sun Mon Tue Wed Thu Sat Baxter Hire, MD      . Derrill Memo ON 12/29/2015] levothyroxine (SYNTHROID, LEVOTHROID) tablet 274 mcg  274 mcg Oral Q Fri Baxter Hire, MD      .  losartan (COZAAR) tablet 25 mg  25 mg Oral Daily Baxter Hire, MD   25 mg at 12/26/15 [id number]  . metoprolol tartrate (LOPRESSOR) tablet 12.5 mg  12.5 mg Oral BID Baxter Hire, MD   12.5 mg at 12/26/15 0953  . mometasone-formoterol (DULERA) 200-5 MCG/ACT inhaler 2 puff  2 puff Inhalation BID Baxter Hire, MD   2 puff at 12/26/15 [phone]  . montelukast (SINGULAIR) tablet 10 mg  10 mg Oral QHS Baxter Hire, MD   10 mg at 12/25/15 2252  . nitroGLYCERIN (NITROSTAT) SL tablet 0.4 mg  0.4 mg Sublingual Q5 min PRN Baxter Hire, MD      . pantoprazole (PROTONIX) 80 mg in sodium chloride 0.9 % 250 mL (0.32 mg/mL) infusion  8 mg/hr Intravenous Continuous Orbie Pyo, MD 25 mL/hr at 12/26/15 0346 8 mg/hr at 12/26/15 0346  . [START ON 12/29/2015] pantoprazole (PROTONIX) injection 40 mg  40 mg Intravenous Q12H Orbie Pyo, MD      . sodium chloride flush (NS) 0.9 % injection 3 mL  3 mL Intravenous Q12H Baxter Hire, MD   3 mL at 12/26/15 0953  . venlafaxine (EFFEXOR) tablet 75 mg  75 mg Oral BID Baxter Hire, MD   75 mg at 12/26/15 [id number]     Discharge Medications: Please see discharge summary for a list of discharge medications.  Relevant Imaging Results:  Relevant Lab Results:   Additional Information SSN [id number]  Georga Kaufmann, LCSWA

## 2015-12-27 ENCOUNTER — Encounter
Admission: EM | Disposition: A | Discharge status: Skilled Nursing Facility | Payer: Self-pay | Source: Home / Self Care | Attending: Internal Medicine

## 2015-12-27 ENCOUNTER — Inpatient Hospital Stay: Payer: Medicare Other | Admitting: Anesthesiology

## 2015-12-27 ENCOUNTER — Inpatient Hospital Stay: Payer: Medicare Other

## 2015-12-27 ENCOUNTER — Encounter: Payer: Self-pay | Admitting: Anesthesiology

## 2015-12-27 HISTORY — PX: ESOPHAGOGASTRODUODENOSCOPY (EGD) WITH PROPOFOL: SHX5813

## 2015-12-27 LAB — TYPE AND SCREEN
ABO/RH(D): A POS
Antibody Screen: NEGATIVE
Unit division: 0
Unit division: 0

## 2015-12-27 LAB — HEMOGLOBIN: Hemoglobin: 8.7 g/dL — ABNORMAL LOW (ref 12.0–16.0)

## 2015-12-27 LAB — GLUCOSE, CAPILLARY
GLUCOSE-CAPILLARY: 201 mg/dL — AB (ref 65–99)
Glucose-Capillary: 210 mg/dL — ABNORMAL HIGH (ref 65–99)
Glucose-Capillary: 188 mg/dL — ABNORMAL HIGH (ref 65–99)
Glucose-Capillary: 306 mg/dL — ABNORMAL HIGH (ref 65–99)
Glucose-Capillary: 276 mg/dL — ABNORMAL HIGH (ref 65–99)

## 2015-12-27 SURGERY — ESOPHAGOGASTRODUODENOSCOPY (EGD) WITH PROPOFOL
Anesthesia: General

## 2015-12-27 MED ORDER — FUROSEMIDE 10 MG/ML IJ SOLN
40.0000 mg | Freq: Once | INTRAMUSCULAR | Status: AC
  Administered 2015-12-27: 40 mg via INTRAVENOUS
  Filled 2015-12-27: qty 4

## 2015-12-27 MED ORDER — SODIUM CHLORIDE 0.9 % IV SOLN
INTRAVENOUS | Status: DC
  Administered 2015-12-27: 15:00:00 via INTRAVENOUS

## 2015-12-27 MED ORDER — IPRATROPIUM-ALBUTEROL 0.5-2.5 (3) MG/3ML IN SOLN
3.0000 mL | Freq: Four times a day (QID) | RESPIRATORY_TRACT | Status: DC
Start: 2015-12-27 — End: 2015-12-27
  Administered 2015-12-27: 3 mL via RESPIRATORY_TRACT

## 2015-12-27 MED ORDER — LIDOCAINE HCL (CARDIAC) 20 MG/ML IV SOLN
INTRAVENOUS | Status: DC | PRN
  Administered 2015-12-27: 60 mg via INTRAVENOUS

## 2015-12-27 MED ORDER — MIDAZOLAM HCL 2 MG/2ML IJ SOLN
INTRAMUSCULAR | Status: DC | PRN
  Administered 2015-12-27: 1 mg via INTRAVENOUS

## 2015-12-27 MED ORDER — IPRATROPIUM-ALBUTEROL 0.5-2.5 (3) MG/3ML IN SOLN: 3.0000 mL | Freq: Four times a day (QID) | RESPIRATORY_TRACT | Status: DC

## 2015-12-27 MED ORDER — PHENYLEPHRINE HCL 10 MG/ML IJ SOLN
INTRAMUSCULAR | Status: DC | PRN
  Administered 2015-12-27 (×2): 200 ug via INTRAVENOUS

## 2015-12-27 MED ORDER — PROPOFOL 10 MG/ML IV BOLUS
INTRAVENOUS | Status: DC | PRN
  Administered 2015-12-27: 20 mg via INTRAVENOUS
  Administered 2015-12-27: 30 mg via INTRAVENOUS

## 2015-12-27 NOTE — Consult Note (Signed)
Subjective: Patient seen for melena, anemia. Patient did well overnight. She is been hemodynamically stable. There's been no bowel movements since last night at around 10:00. At that time it is of brown stool not black. No nausea vomiting or abdominal pain.  I discussed with her again when she may have had her last dose of eliquis. She believes that was the morning of her hospitalization, and would now be over 48 hours.  Objective: Vital signs in last 24 hours: Temp:  [97.6 F (36.4 C)-98.3 F (36.8 C)] 98.3 F (36.8 C) (07/05 1207) Pulse Rate:  [48-89] 48 (07/05 1207) Resp:  [17-21] 17 (07/05 1207) BP: (120-154)/(54-78) 136/56 mmHg (07/05 1207) SpO2:  [94 %-99 %] 98 % (07/05 1207) Blood pressure 136/56, pulse 48, temperature 98.3 F (36.8 C), temperature source Oral, resp. rate 17, height 5\' 4"  (1.626 m), weight 116.62 kg (257 lb 1.6 oz), SpO2 98 %.   Intake/Output from previous day: 07/04 0701 - 07/05 0700 In: 1582 [P.O.:660; I.V.:522; Blood:400] Out: 1500 [Urine:1500]  Intake/Output this shift: Total I/O In: 200 [P.O.:200] Out: 400 [Urine:400]   General appearance:  82 year old female no distress Resp:  Bilaterally clear to auscultation. Patient reports a little bit of shortness of breath this afternoon Cardio:  Regular rate and rhythm without rub or gallop GI:  Soft nontender nondistended bowel sounds positive normoactive Extremities:  1+ to 2+ bilateral lower extremity edema.   Lab Results: Results for orders placed or performed during the hospital encounter of 12/25/15 (from the past 24 hour(s))  Glucose, capillary     Status: Abnormal   Collection Time: 12/26/15  4:35 PM  Result Value Ref Range   Glucose-Capillary 179 (H) 65 - 99 mg/dL   Comment 1 Notify RN   Glucose, capillary     Status: Abnormal   Collection Time: 12/26/15  9:32 PM  Result Value Ref Range   Glucose-Capillary 233 (H) 65 - 99 mg/dL  Glucose, capillary     Status: Abnormal   Collection Time:  12/26/15 11:17 PM  Result Value Ref Range   Glucose-Capillary 231 (H) 65 - 99 mg/dL  Hemoglobin     Status: Abnormal   Collection Time: 12/27/15  5:13 AM  Result Value Ref Range   Hemoglobin 8.7 (L) 12.0 - 16.0 g/dL  Glucose, capillary     Status: Abnormal   Collection Time: 12/27/15  6:38 AM  Result Value Ref Range   Glucose-Capillary 210 (H) 65 - 99 mg/dL  Glucose, capillary     Status: Abnormal   Collection Time: 12/27/15  7:20 AM  Result Value Ref Range   Glucose-Capillary 201 (H) 65 - 99 mg/dL  Glucose, capillary     Status: Abnormal   Collection Time: 12/27/15 11:21 AM  Result Value Ref Range   Glucose-Capillary 188 (H) 65 - 99 mg/dL      Recent Labs  12/25/15 1730 12/26/15 0950 12/27/15 0513  WBC 7.5 7.8  --   HGB 6.7* 9.3* 8.7*  HCT 21.1* 28.2*  --   PLT 179 163  --    BMET  Recent Labs  12/25/15 1730 12/26/15 0950  NA 141 144  K 4.2 4.6  CL 107 113*  CO2 27 23  GLUCOSE 91 136*  BUN 45* 39*  CREATININE 1.72* 1.63*  CALCIUM 8.5* 8.7*   LFT  Recent Labs  12/25/15 1730  PROT 6.3*  ALBUMIN 3.1*  AST 23  ALT 25  ALKPHOS 72  BILITOT 0.4   PT/INR No results for  input(s): LABPROT, INR in the last 72 hours. Hepatitis Panel No results for input(s): HEPBSAG, HCVAB, HEPAIGM, HEPBIGM in the last 72 hours. C-Diff No results for input(s): CDIFFTOX in the last 72 hours. No results for input(s): CDIFFPCR in the last 72 hours.   Studies/Results: No results found.  Scheduled Inpatient Medications:   . fluticasone  1 spray Each Nare Daily  . furosemide  40 mg Oral Daily  . gabapentin  300 mg Oral BID  . insulin aspart  0-9 Units Subcutaneous Q6H  . latanoprost  1 drop Both Eyes QHS  . levothyroxine  137 mcg Oral Once per day on Sun Mon Tue Wed Thu Sat  . [START ON 12/29/2015] levothyroxine  274 mcg Oral Q Fri  . losartan  25 mg Oral Daily  . metoprolol tartrate  12.5 mg Oral BID  . mometasone-formoterol  2 puff Inhalation BID  . montelukast  10 mg  Oral QHS  . nystatin   Topical BID  . [START ON 12/29/2015] pantoprazole  40 mg Intravenous Q12H  . sodium chloride flush  3 mL Intravenous Q12H  . venlafaxine  75 mg Oral BID    Continuous Inpatient Infusions:   . sodium chloride    . pantoprozole (PROTONIX) infusion 8 mg/hr (12/27/15 0510)    PRN Inpatient Medications:  acetaminophen, albuterol, baclofen, nitroGLYCERIN  Miscellaneous:   Assessment:  1. Recent black stools/melena in the setting of dual antiplatelet anticoagulation treatment. Currently off these medications over 48 hours. No black stools over 48 hours. Hemodynamically stable. 2. Some increased shortness of breath this morning. There also seems to be a little bit of increased lower extremity edema.  Plan:  1. Will check a two-way chest film 2. EGD. I have discussed the risks benefits and complications of procedures to include not limited to bleeding, infection, perforation and the risk of sedation and the patient wishes to proceed. 3. Continue PPIs you are. She will need to be on regular use of a proton pump inhibitor when she goes home particularly if anticoagulation treatment is reinstituted.  Lollie Sails MD 12/27/2015, 12:19 PM

## 2015-12-27 NOTE — Care Management Important Message (Signed)
Important Message  Patient Details  Name: Jill York MRN: [id number] Date of Birth: 10-Jul-1930   Medicare Important Message Given:  Yes    Juliann Pulse A Cornella Emmer 12/27/2015, 2:15 PM

## 2015-12-27 NOTE — Progress Notes (Signed)
Inpatient Diabetes Program Recommendations  AACE/ADA: New Consensus Statement on Inpatient Glycemic Control (2015)  Target Ranges:  Prepandial:   less than 140 mg/dL      Peak postprandial:   less than 180 mg/dL (1-2 hours)      Critically ill patients:  140 - 180 mg/dL   Lab Results  Component Value Date   GLUCAP 188* 12/27/2015   HGBA1C 8.4* 01/24/2014    Review of Glycemic Control  Results for Jill York, Jill York (MRN [id number]) as of 12/27/2015 11:31  Ref. Range 12/26/2015 21:32 12/26/2015 23:17 12/27/2015 06:38 12/27/2015 07:20 12/27/2015 11:21  Glucose-Capillary Latest Ref Range: 65-99 mg/dL 233 (H) 231 (H) 210 (H) 201 (H) 188 (H)    Diabetes history: Type 2 Outpatient Diabetes medications: Lantus 54 units qhs, Humalog 20 units with breakfast, 26 units with lunch and 20 units with supper Current orders for Inpatient glycemic control: Novolog 0-9 units q6h  Inpatient Diabetes Program Recommendations:  Patient is currently NPO for surgery.  Please consider starting Lantus 27 units tonight, continue Novolog correction insulin but consider changing it to Novolog moderate correction (0-15 units) q6h.   Insulin (basal/ Lantus and Novolog correction) must be given even if the patient is NPO.  Gentry Fitz, RN, BA, MHA, CDE Diabetes Coordinator Inpatient Diabetes Program  [phone] (Team Pager) [phone] ([place]) 12/27/2015 11:36 AM

## 2015-12-27 NOTE — Transfer of Care (Signed)
Immediate Anesthesia Transfer of Care Note  Patient: Jill York  Procedure(s) Performed: Procedure(s) with comments: ESOPHAGOGASTRODUODENOSCOPY (EGD) WITH PROPOFOL (N/A) - Late afternoon case  Patient Location: Endoscopy Unit  Anesthesia Type:General  Level of Consciousness: awake, alert , oriented and patient cooperative  Airway & Oxygen Therapy: Patient Spontanous Breathing and Patient connected to nasal cannula oxygen  Post-op Assessment: Report given to RN, Post -op Vital signs reviewed and stable and Patient moving all extremities X 4  Post vital signs: Reviewed and stable  Last Vitals:  Filed Vitals:   12/27/15 1207 12/27/15 1608  BP: 136/56 156/71  Pulse: 48 82  Temp: 36.8 C 37.2 C  Resp: 17 24    Last Pain:  Filed Vitals:   12/27/15 1611  PainSc: 0-No pain         Complications: No apparent anesthesia complications

## 2015-12-27 NOTE — OR Nursing (Signed)
17:40 - Patient transferred to Room 217 via Hospital Bed.  Report to Danne Harbor RN.  Norlene Campbell, RN

## 2015-12-27 NOTE — Op Note (Signed)
[facility] Gastroenterology Patient Name: Jill York Procedure Date: 12/27/2015 4:10 PM MRN: [id number] Account #: [id number] Date of Birth: 1930/12/30 Admit Type: Inpatient Age: 82 Room: [facility] ENDO ROOM 1 Gender: Female Note Status: Finalized Procedure:            Upper GI endoscopy Indications:          Melena Providers:            Lollie Sails, MD Referring MD:         Salome Holmes (Referring MD) Medicines:            Monitored Anesthesia Care Complications:        No immediate complications. Procedure:            Pre-Anesthesia Assessment:                       - ASA Grade Assessment: III - A patient with severe                        systemic disease.                       After obtaining informed consent, the endoscope was                        passed under direct vision. Throughout the procedure,                        the patient's blood pressure, pulse, and oxygen                        saturations were monitored continuously. The Endoscope                        was introduced through the mouth, and advanced to the                        third part of duodenum. The upper GI endoscopy was                        accomplished without difficulty. Findings:      The examined esophagus was normal.      Patchy minimal inflammation characterized by erosions was found in the       gastric antrum and in the prepyloric region of the stomach.      The cardia and gastric fundus were normal on retroflexion.      A large polypoid mass, consistent with adenoma, with no bleeding was       found in the duodenal bulb. Biopsies were taken with a cold forceps for       histology. Hemostasis was noted to be good. Impression:           - Normal esophagus.                       - Erosive gastritis.                       - Mass (suspected adenoma) in the duodenal bulb.                        Biopsied. Recommendation:       -  Await pathology results. Lollie Sails, MD 12/27/2015 4:38:09 PM This report has been signed electronically. Number of Addenda: 0 Note Initiated On: 12/27/2015 4:10 PM      [facility]

## 2015-12-27 NOTE — Progress Notes (Signed)
[place] at [place] NAME: Verga Dabney    MR#:  [id number]  DATE OF BIRTH:  06-19-1931  CHIEF COMPLAINT:   Chief Complaint  Patient presents with  . Abnormal Lab   Admitted for GI bleed. No further bleeding noticed. Mild SOB that is chronic. Sitting in a chair. .   Review of Systems  Constitutional: Negative for fever and chills.  HENT: Negative for sore throat.   Eyes: Negative for blurred vision, double vision and pain.  Respiratory: Negative for cough, hemoptysis, shortness of breath and wheezing.   Cardiovascular: Negative for chest pain, palpitations, orthopnea and leg swelling.  Gastrointestinal: Negative for heartburn, nausea, vomiting, abdominal pain, diarrhea and constipation.  Genitourinary: Negative for dysuria and hematuria.  Musculoskeletal: Negative for back pain and joint pain.  Skin: Negative for rash.  Neurological: Negative for sensory change, speech change, focal weakness and headaches.  Endo/Heme/Allergies: Does not bruise/bleed easily.  Psychiatric/Behavioral: Negative for depression. The patient is not nervous/anxious.     VITAL SIGNS: Blood pressure 136/56, pulse 48, temperature 98.3 F (36.8 C), temperature source Oral, resp. rate 17, height 5\' 4"  (1.626 m), weight 116.62 kg (257 lb 1.6 oz), SpO2 98 %.  PHYSICAL EXAMINATION:   GENERAL:  82 y.o.-year-old patient lying in the bed with no acute distress. Obese EYES: Pupils equal, round, reactive to light and accommodation. No scleral icterus. Extraocular muscles intact.  HEENT: Head atraumatic, normocephalic. Oropharynx and nasopharynx clear.  NECK:  Supple, no jugular venous distention. No thyroid enlargement, no tenderness.  LUNGS: Normal breath sounds bilaterally, no wheezing,rhonchi or crepitation. No use of accessory muscles of respiration.  Mild basal crackles CARDIOVASCULAR: Irregular.No murmurs, rubs, or gallops.  ABDOMEN: Soft, nontender,  nondistended. Bowel sounds present. No organomegaly or mass.  EXTREMITIES: 1-2+ lower extremity and pedal edema, no cyanosis, or clubbing. Left ankle tender in medial aspect and with extension.  NEUROLOGIC: Cranial nerves II through XII are grossly intact  Muscle strength 5 out of 5 in all extremities.  Gait not checked.  PSYCHIATRIC: The patient is awake and alert SKIN: No obvious rash, lesion, or ulcer.  Brace on LLE  ORDERS/RESULTS REVIEWED:   CBC  Recent Labs Lab 12/20/15 1259 12/25/15 1730 12/26/15 0950 12/27/15 0513  WBC  --  7.5 7.8  --   HGB 8.2* 6.7* 9.3* 8.7*  HCT  --  21.1* 28.2*  --   PLT  --  179 163  --   MCV  --  96.4 90.8  --   MCH  --  30.6 30.0  --   MCHC  --  31.7* 33.1  --   RDW  --  18.3* 18.3*  --    ------------------------------------------------------------------------------------------------------------------  Chemistries   Recent Labs Lab 12/25/15 1730 12/26/15 0950  NA 141 144  K 4.2 4.6  CL 107 113*  CO2 27 23  GLUCOSE 91 136*  BUN 45* 39*  CREATININE 1.72* 1.63*  CALCIUM 8.5* 8.7*  AST 23  --   ALT 25  --   ALKPHOS 72  --   BILITOT 0.4  --    ------------------------------------------------------------------------------------------------------------------ estimated creatinine clearance is 32.8 mL/min (by C-G formula based on Cr of 1.63). ------------------------------------------------------------------------------------------------------------------ No results for input(s): TSH, T4TOTAL, T3FREE, THYROIDAB in the last 72 hours.  Invalid input(s): FREET3  Cardiac Enzymes  Recent Labs Lab 12/25/15 1730 12/25/15 2038 12/26/15 0950  TROPONINI 0.04* 0.05* 0.05*   ------------------------------------------------------------------------------------------------------------------ Invalid input(s): POCBNP ---------------------------------------------------------------------------------------------------------------  RADIOLOGY: No  results found.  EKG:  Orders placed or performed during the hospital encounter of 12/15/15  . ED EKG  . ED EKG    ASSESSMENT AND PLAN:  Active Problems:   Acute blood loss anemia  # Acute on chronic posthemorrhagic symptomatic anemia Due to GI bleed status post 2 unit of packed red blood cell transfusion Hb Improved to 9.3 No further bleeding noticed EGD likely tomorrow Hold Eliquis  # Elevated troponin due to demand ischemia Seen by cardiology during previous admission with much higher troponins. No further investigation advised. Repeat troponin stable  # Acute on chronic diastolic CHF On Lasix. Lower extremity edema with some shortness of breath. We will give 1 dose of IV Lasix.  # CKD 3 Stable  # Left Comminuted fracture of the lateral malleolus Brace in place  # atrial fibrillation Eliquis on hold  DRUG ALLERGIES:  Allergies  Allergen Reactions  . Amlodipine Swelling    Leg swelling only   . Oxycodone Other (See Comments)    Reaction: patient states she was "out" for 2 hours and did not notice people in the area    . Demerol [Meperidine] Diarrhea and Nausea And Vomiting  . Lisinopril Cough  . Tape Rash    CODE STATUS:     Code Status Orders        Start     Ordered   12/15/15 1200  Do not attempt resuscitation (DNR)   Continuous    Question Answer Comment  In the event of cardiac or respiratory ARREST Do not call a "code blue"   In the event of cardiac or respiratory ARREST Do not perform Intubation, CPR, defibrillation or ACLS   In the event of cardiac or respiratory ARREST Use medication by any route, position, wound care, and other measures to relive pain and suffering. May use oxygen, suction and manual treatment of airway obstruction as needed for comfort.      12/15/15 1159    Code Status History    Date Active Date Inactive Code Status Order ID Comments User Context   01/01/2015 10:29 AM 01/04/2015  1:43 PM DNR [id number]  Loletha Grayer,  MD ED    Advance Directive Documentation        Most Recent Value   Type of Advance Directive  Healthcare Power of Attorney   Pre-existing out of facility DNR order (yellow form or pink MOST form)     "MOST" Form in Place?        TOTAL TIME TAKING CARE OF THIS PATIENT: 35 minutes.   Hillary Bow R M.D on 12/27/2015 at 12:43 PM  Between 7am to 6pm - Pager - [phone]  After 6pm go to [email or web address] - password [email or web address] [facility]  Office  [phone]  CC: Primary care physician; Sharyne Peach, MD

## 2015-12-27 NOTE — Anesthesia Preprocedure Evaluation (Addendum)
Anesthesia Evaluation  Patient identified by MRN, date of birth, ID band Patient awake    Reviewed: Allergy & Precautions, NPO status , Patient's Chart, lab work & pertinent test results, reviewed documented beta blocker date and time   Airway Mallampati: III  TM Distance: >3 FB     Dental  (+) Chipped   Pulmonary sleep apnea , COPD, former smoker,           Cardiovascular hypertension, Pt. on medications and Pt. on home beta blockers + Past MI and +CHF  + dysrhythmias Atrial Fibrillation      Neuro/Psych    GI/Hepatic   Endo/Other  diabetesMorbid obesity  Renal/GU Renal disease     Musculoskeletal  (+) Arthritis ,   Abdominal   Peds  Hematology  (+) anemia ,   Anesthesia Other Findings Breathing OK. CHF. On lasix. Does not use CPAP. Anemic Hb 8.7 was 9.3 yesterday. EKG OK. Has had 2 stents (cardiac). Echo last year 50%. O2 at nite. DNR. Pt understands it will be held until after procedure.  Reproductive/Obstetrics                            Anesthesia Physical Anesthesia Plan  ASA: III  Anesthesia Plan: General   Post-op Pain Management:    Induction: Intravenous  Airway Management Planned: Nasal Cannula  Additional Equipment:   Intra-op Plan:   Post-operative Plan:   Informed Consent: I have reviewed the patients History and Physical, chart, labs and discussed the procedure including the risks, benefits and alternatives for the proposed anesthesia with the patient or authorized representative who has indicated his/her understanding and acceptance.     Plan Discussed with: CRNA  Anesthesia Plan Comments:         Anesthesia Quick Evaluation

## 2015-12-28 ENCOUNTER — Encounter: Payer: Self-pay | Admitting: Gastroenterology

## 2015-12-28 LAB — HEMOGLOBIN
HEMOGLOBIN: 8 g/dL — AB (ref 12.0–16.0)
Hemoglobin: 7.7 g/dL — ABNORMAL LOW (ref 12.0–16.0)

## 2015-12-28 LAB — GLUCOSE, CAPILLARY
GLUCOSE-CAPILLARY: 195 mg/dL — AB (ref 65–99)
GLUCOSE-CAPILLARY: 177 mg/dL — AB (ref 65–99)
GLUCOSE-CAPILLARY: 248 mg/dL — AB (ref 65–99)
GLUCOSE-CAPILLARY: 267 mg/dL — AB (ref 65–99)
Glucose-Capillary: 351 mg/dL — ABNORMAL HIGH (ref 65–99)

## 2015-12-28 MED ORDER — INSULIN ASPART 100 UNIT/ML ~~LOC~~ SOLN
0.0000 [IU] | Freq: Three times a day (TID) | SUBCUTANEOUS | Status: DC
  Administered 2015-12-29: 3 [IU] via SUBCUTANEOUS
  Administered 2015-12-29: 7 [IU] via SUBCUTANEOUS
  Administered 2015-12-29: 3 [IU] via SUBCUTANEOUS
  Administered 2015-12-30 (×2): 5 [IU] via SUBCUTANEOUS
  Administered 2015-12-30: 7 [IU] via SUBCUTANEOUS
  Administered 2015-12-31 (×2): 2 [IU] via SUBCUTANEOUS
  Administered 2016-01-01: 7 [IU] via SUBCUTANEOUS
  Administered 2016-01-01: 5 [IU] via SUBCUTANEOUS
  Filled 2015-12-28: qty 9
  Filled 2015-12-28: qty 2
  Filled 2015-12-28: qty 3
  Filled 2015-12-28: qty 7
  Filled 2015-12-28 (×2): qty 5
  Filled 2015-12-28: qty 2
  Filled 2015-12-28: qty 7
  Filled 2015-12-28: qty 3
  Filled 2015-12-28: qty 5

## 2015-12-28 MED ORDER — FUROSEMIDE 40 MG PO TABS
40.0000 mg | ORAL_TABLET | Freq: Two times a day (BID) | ORAL | Status: DC
  Administered 2015-12-28 – 2015-12-29 (×2): 40 mg via ORAL
  Filled 2015-12-28 (×2): qty 1

## 2015-12-28 MED ORDER — INSULIN GLARGINE 100 UNIT/ML ~~LOC~~ SOLN
10.0000 [IU] | Freq: Every day | SUBCUTANEOUS | Status: DC
  Administered 2015-12-28: 10 [IU] via SUBCUTANEOUS
  Filled 2015-12-28 (×3): qty 0.1

## 2015-12-28 NOTE — Progress Notes (Signed)
Notified Dr. Verdell Carmine of pts current BP of 123/49 and todays DBP that's been in the 40s. Current HR is in the 70s. Asked if we should hold or give metoprolol. MD ordered to hold metoprolol for tonight.

## 2015-12-28 NOTE — Progress Notes (Signed)
[place] at [place] NAME: Jill York    MR#:  [id number]  DATE OF BIRTH:  [birth year]  CHIEF COMPLAINT:   Chief Complaint  Patient presents with  . Abnormal Lab   Admitted for GI bleed. No further bleeding noticed. Mild SOB that is chronic.  EGD showed Duodenal mass .   Review of Systems  Constitutional: Negative for fever and chills.  HENT: Negative for sore throat.   Eyes: Negative for blurred vision, double vision and pain.  Respiratory: Negative for cough, hemoptysis, shortness of breath and wheezing.   Cardiovascular: Negative for chest pain, palpitations, orthopnea and leg swelling.  Gastrointestinal: Negative for heartburn, nausea, vomiting, abdominal pain, diarrhea and constipation.  Genitourinary: Negative for dysuria and hematuria.  Musculoskeletal: Negative for back pain and joint pain.  Skin: Negative for rash.  Neurological: Negative for sensory change, speech change, focal weakness and headaches.  Endo/Heme/Allergies: Does not bruise/bleed easily.  Psychiatric/Behavioral: Negative for depression. The patient is not nervous/anxious.     VITAL SIGNS: Blood pressure 145/42, pulse 71, temperature 97.5 F (36.4 C), temperature source Oral, resp. rate 20, height 5\' 4"  (1.626 m), weight 116.62 kg (257 lb 1.6 oz), SpO2 100 %.  PHYSICAL EXAMINATION:   GENERAL:  82 y.o.-year-old patient lying in the bed with no acute distress. Obese.  EYES: Pupils equal, round, reactive to light and accommodation. No scleral icterus. Extraocular muscles intact.  HEENT: Head atraumatic, normocephalic. Oropharynx and nasopharynx clear.  NECK:  Supple, no jugular venous distention. No thyroid enlargement, no tenderness.  LUNGS: Normal breath sounds bilaterally, no wheezing,rhonchi or crepitation. No use of accessory muscles of respiration.  Mild basal crackles CARDIOVASCULAR: Irregular.No murmurs, rubs, or gallops.  ABDOMEN: Soft,  nontender, nondistended. Bowel sounds present. No organomegaly or mass.  EXTREMITIES: 1-2+ lower extremity and pedal edema, no cyanosis, or clubbing. Left ankle tender in medial aspect and with extension.  NEUROLOGIC: Cranial nerves II through XII are grossly intact  Muscle strength 5 out of 5 in all extremities.  Gait not checked.  PSYCHIATRIC: The patient is awake and alert SKIN: No obvious rash, lesion, or ulcer.  Brace on LLE  ORDERS/RESULTS REVIEWED:   CBC  Recent Labs Lab 12/25/15 1730 12/26/15 0950 12/27/15 0513 12/28/15 0426  WBC 7.5 7.8  --   --   HGB 6.7* 9.3* 8.7* 8.0*  HCT 21.1* 28.2*  --   --   PLT 179 163  --   --   MCV 96.4 90.8  --   --   MCH 30.6 30.0  --   --   MCHC 31.7* 33.1  --   --   RDW 18.3* 18.3*  --   --    ------------------------------------------------------------------------------------------------------------------  Chemistries   Recent Labs Lab 12/25/15 1730 12/26/15 0950  NA 141 144  K 4.2 4.6  CL 107 113*  CO2 27 23  GLUCOSE 91 136*  BUN 45* 39*  CREATININE 1.72* 1.63*  CALCIUM 8.5* 8.7*  AST 23  --   ALT 25  --   ALKPHOS 72  --   BILITOT 0.4  --    ------------------------------------------------------------------------------------------------------------------ estimated creatinine clearance is 32.8 mL/min (by C-G formula based on Cr of 1.63). ------------------------------------------------------------------------------------------------------------------ No results for input(s): TSH, T4TOTAL, T3FREE, THYROIDAB in the last 72 hours.  Invalid input(s): FREET3  Cardiac Enzymes  Recent Labs Lab 12/25/15 1730 12/25/15 2038 12/26/15 0950  TROPONINI 0.04* 0.05* 0.05*   ------------------------------------------------------------------------------------------------------------------ Invalid input(s):  POCBNP ---------------------------------------------------------------------------------------------------------------  RADIOLOGY: Dg Chest 2 View  12/27/2015  CLINICAL DATA:  Chronic shortness of breath. EXAM: CHEST  2 VIEW COMPARISON:  01/01/2015 FINDINGS: AP and lateral views of the chest show enlargement of the cardiopericardial silhouette, stable. There is pulmonary vascular congestion without overt pulmonary edema although interstitial prominence raises the question of a degree of interstitial edema no focal airspace consolidation. No pleural effusion. Degenerative or chronic posttraumatic changes noted in the right shoulder. Telemetry leads overlie the chest. IMPRESSION: Cardiomegaly with vascular congestion and possible interstitial edema. Electronically Signed   By: Misty Stanley M.D.   On: 12/27/2015 13:42    EKG:  Orders placed or performed during the hospital encounter of 12/15/15  . ED EKG  . ED EKG    ASSESSMENT AND PLAN:  Active Problems:   Acute blood loss anemia  # Acute on chronic posthemorrhagic symptomatic anemia Due to GI bleed - worsening status post 2 unit of packed red blood cell transfusion Hb Improved to 9.3 but now at 8. Although No further bleeding noticed EGD showed no bleeding but a duodenal mass-adenoma? Hold Eliquis and aspirin  Likely d/c in AM if Hb stable. Will have to hold eliquis. Needs outpatient EUS for duodenal mass.  # Elevated troponin due to demand ischemia Seen by cardiology during previous admission with much higher troponins. No further investigation advised. Repeat troponin stable  # Acute on chronic diastolic CHF On Lasix. Improved  # CKD 3 Stable  # Left Comminuted fracture of the lateral malleolus Brace in place  # Atrial fibrillation Eliquis on hold  DRUG ALLERGIES:  Allergies  Allergen Reactions  . Amlodipine Swelling    Leg swelling only   . Oxycodone Other (See Comments)    Reaction: patient states she was "out"  for 2 hours and did not notice people in the area    . Demerol [Meperidine] Diarrhea and Nausea And Vomiting  . Lisinopril Cough  . Tape Rash    CODE STATUS:     Code Status Orders        Start     Ordered   12/15/15 1200  Do not attempt resuscitation (DNR)   Continuous    Question Answer Comment  In the event of cardiac or respiratory ARREST Do not call a "code blue"   In the event of cardiac or respiratory ARREST Do not perform Intubation, CPR, defibrillation or ACLS   In the event of cardiac or respiratory ARREST Use medication by any route, position, wound care, and other measures to relive pain and suffering. May use oxygen, suction and manual treatment of airway obstruction as needed for comfort.      12/15/15 1159    Code Status History    Date Active Date Inactive Code Status Order ID Comments User Context   01/01/2015 10:29 AM 01/04/2015  1:43 PM DNR [id number]  Loletha Grayer, MD ED    Advance Directive Documentation        Most Recent Value   Type of Advance Directive  Healthcare Power of Attorney   Pre-existing out of facility DNR order (yellow form or pink MOST form)     "MOST" Form in Place?        TOTAL TIME TAKING CARE OF THIS PATIENT: 35 minutes.   Hillary Bow R M.D on 12/28/2015 at 1:10 PM  Between 7am to 6pm - Pager - [phone]  After 6pm go to [email or web address] - password [email or web address] [facility]  [facility]  Office  [phone]  CC: Primary  care physician; Sharyne Peach, MD

## 2015-12-28 NOTE — Consult Note (Addendum)
Subjective: Patient seen for melena, anemia. Patient denies any nausea vomiting or abdominal pain. Her last bowel movement was last night about 24 hours ago.  Objective: Vital signs in last 24 hours: Temp:  [97.5 F (36.4 C)-98.2 F (36.8 C)] 97.5 F (36.4 C) (07/06 1217) Pulse Rate:  [71-78] 71 (07/06 1217) Resp:  [18-20] 20 (07/06 1217) BP: (98-145)/(42-87) 145/42 mmHg (07/06 1217) SpO2:  [100 %] 100 % (07/06 1217) Blood pressure 145/42, pulse 71, temperature 97.5 F (36.4 C), temperature source Oral, resp. rate 20, height 5\' 4"  (1.626 m), weight 116.62 kg (257 lb 1.6 oz), SpO2 100 %.   Intake/Output from previous day: 07/05 0701 - 07/06 0700 In: 88 [P.O.:480; I.V.:300] Out: 2000 [Urine:2000]  Intake/Output this shift: Total I/O In: 1461.5 [P.O.:720; I.V.:741.5] Out: 1800 [Urine:1800]   General appearance:  61 female no distress Resp:  Bilaterally clear to auscultation Cardio:  Irregular GI:  Soft nontender nondistended bowel sounds positive normoactive Extremities:  1-2+ lower extremity edema   Lab Results: Results for orders placed or performed during the hospital encounter of 12/25/15 (from the past 24 hour(s))  Glucose, capillary     Status: Abnormal   Collection Time: 12/27/15  9:10 PM  Result Value Ref Range   Glucose-Capillary 306 (H) 65 - 99 mg/dL  Glucose, capillary     Status: Abnormal   Collection Time: 12/27/15 10:53 PM  Result Value Ref Range   Glucose-Capillary 276 (H) 65 - 99 mg/dL  Hemoglobin     Status: Abnormal   Collection Time: 12/28/15  4:26 AM  Result Value Ref Range   Hemoglobin 8.0 (L) 12.0 - 16.0 g/dL  Glucose, capillary     Status: Abnormal   Collection Time: 12/28/15  5:37 AM  Result Value Ref Range   Glucose-Capillary 195 (H) 65 - 99 mg/dL  Glucose, capillary     Status: Abnormal   Collection Time: 12/28/15  7:21 AM  Result Value Ref Range   Glucose-Capillary 177 (H) 65 - 99 mg/dL   Comment 1 Notify RN   Glucose, capillary      Status: Abnormal   Collection Time: 12/28/15 12:03 PM  Result Value Ref Range   Glucose-Capillary 248 (H) 65 - 99 mg/dL  Glucose, capillary     Status: Abnormal   Collection Time: 12/28/15  4:23 PM  Result Value Ref Range   Glucose-Capillary 267 (H) 65 - 99 mg/dL   Comment 1 Notify RN   Hemoglobin     Status: Abnormal   Collection Time: 12/28/15  5:06 PM  Result Value Ref Range   Hemoglobin 7.7 (L) 12.0 - 16.0 g/dL      Recent Labs  12/26/15 0950 12/27/15 0513 12/28/15 0426 12/28/15 1706  WBC 7.8  --   --   --   HGB 9.3* 8.7* 8.0* 7.7*  HCT 28.2*  --   --   --   PLT 163  --   --   --    BMET  Recent Labs  12/26/15 0950  NA 144  K 4.6  CL 113*  CO2 23  GLUCOSE 136*  BUN 39*  CREATININE 1.63*  CALCIUM 8.7*   LFT No results for input(s): PROT, ALBUMIN, AST, ALT, ALKPHOS, BILITOT, BILIDIR, IBILI in the last 72 hours. PT/INR No results for input(s): LABPROT, INR in the last 72 hours. Hepatitis Panel No results for input(s): HEPBSAG, HCVAB, HEPAIGM, HEPBIGM in the last 72 hours. C-Diff No results for input(s): CDIFFTOX in the last 72 hours. No  results for input(s): CDIFFPCR in the last 72 hours.   Studies/Results: Dg Chest 2 View  12/27/2015  CLINICAL DATA:  Chronic shortness of breath. EXAM: CHEST  2 VIEW COMPARISON:  01/01/2015 FINDINGS: AP and lateral views of the chest show enlargement of the cardiopericardial silhouette, stable. There is pulmonary vascular congestion without overt pulmonary edema although interstitial prominence raises the question of a degree of interstitial edema no focal airspace consolidation. No pleural effusion. Degenerative or chronic posttraumatic changes noted in the right shoulder. Telemetry leads overlie the chest. IMPRESSION: Cardiomegaly with vascular congestion and possible interstitial edema. Electronically Signed   By: Misty Stanley M.D.   On: 12/27/2015 13:42    Scheduled Inpatient Medications:   . fluticasone  1 spray Each  Nare Daily  . furosemide  40 mg Oral BID  . gabapentin  300 mg Oral BID  . [START ON 12/29/2015] insulin aspart  0-9 Units Subcutaneous TID WC  . insulin glargine  10 Units Subcutaneous QHS  . latanoprost  1 drop Both Eyes QHS  . levothyroxine  137 mcg Oral Once per day on Sun Mon Tue Wed Thu Sat  . [START ON 12/29/2015] levothyroxine  274 mcg Oral Q Fri  . metoprolol tartrate  12.5 mg Oral BID  . mometasone-formoterol  2 puff Inhalation BID  . montelukast  10 mg Oral QHS  . nystatin   Topical BID  . [START ON 12/29/2015] pantoprazole  40 mg Intravenous Q12H  . sodium chloride flush  3 mL Intravenous Q12H  . venlafaxine  75 mg Oral BID    Continuous Inpatient Infusions:     PRN Inpatient Medications:  acetaminophen, albuterol, baclofen, nitroGLYCERIN  Miscellaneous:   Assessment:  1. Melena while on antiplatelet therapy. EGD yesterday showing erosive gastritis as well as a duodenal mass. 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 Biopsies are pending. 2. Anemia. It was a drop from yesterday 8 of her hemoglobin. A recheck this evening showed this to be 7.7. There has been some increasing lower extremity edema and possibility that at least part of this is dilutional since she is not showing evidence of active GI bleeding currently.  Plan:  Serial hemoglobin check Awaiting biopsy results Continue IV PPI as you are.   Lollie Sails MD 12/28/2015, 6:11 PM

## 2015-12-28 NOTE — Progress Notes (Signed)
Inpatient Diabetes Program Recommendations  AACE/ADA: New Consensus Statement on Inpatient Glycemic Control (2015)  Target Ranges:  Prepandial:   less than 140 mg/dL      Peak postprandial:   less than 180 mg/dL (1-2 hours)      Critically ill patients:  140 - 180 mg/dL   Lab Results  Component Value Date   GLUCAP 177* 12/28/2015   HGBA1C 8.4* 01/24/2014    Diabetes history: Type 2 Outpatient Diabetes medications: Lantus 54 units qhs, Humalog 20 units with breakfast, 26 units with lunch and 20 units with supper Current orders for Inpatient glycemic control: Novolog 0-9 units q6h  Inpatient Diabetes Program Recommendations:   Please consider starting Lantus 10 units tonight.  Consider changing Novolog sensitive correction to tid with meals and add Novolog 0-5 units qhs.  Spoke to patient at the bedside- confirms she does take insulin as ordered (see above). Takes Lantus in her abdomen and Novolog in her leg.  Uses a syringe and bottle.   Gentry Fitz, RN, BA, MHA, CDE Diabetes Coordinator Inpatient Diabetes Program  [phone] (Team Pager) [phone] ([place]) 12/28/2015 10:45 AM

## 2015-12-28 NOTE — Anesthesia Postprocedure Evaluation (Signed)
Anesthesia Post Note  Patient: Jill York  Procedure(s) Performed: Procedure(s) (LRB): ESOPHAGOGASTRODUODENOSCOPY (EGD) WITH PROPOFOL (N/A)  Patient location during evaluation: Endoscopy Anesthesia Type: General Level of consciousness: awake and alert Pain management: pain level controlled Vital Signs Assessment: post-procedure vital signs reviewed and stable Respiratory status: spontaneous breathing, nonlabored ventilation, respiratory function stable and patient connected to nasal cannula oxygen Cardiovascular status: blood pressure returned to baseline and stable Postop Assessment: no signs of nausea or vomiting Anesthetic complications: no    Last Vitals:  Filed Vitals:   12/27/15 2322 12/28/15 0453  BP: 130/54 109/87  Pulse:  75  Temp:  36.6 C  Resp:  19    Last Pain:  Filed Vitals:   12/28/15 0754  PainSc: 5                  Martha Clan

## 2015-12-28 NOTE — Clinical Social Work Note (Signed)
CSW updated [facility] regarding patient not discharging today. Will continue to follow. Shela Leff MSW,LCSW [phone]

## 2015-12-29 LAB — GLUCOSE, CAPILLARY
GLUCOSE-CAPILLARY: 303 mg/dL — AB (ref 65–99)
GLUCOSE-CAPILLARY: 306 mg/dL — AB (ref 65–99)
Glucose-Capillary: 405 mg/dL — ABNORMAL HIGH (ref 65–99)
Glucose-Capillary: 406 mg/dL — ABNORMAL HIGH (ref 65–99)
Glucose-Capillary: 269 mg/dL — ABNORMAL HIGH (ref 65–99)
Glucose-Capillary: 221 mg/dL — ABNORMAL HIGH (ref 65–99)
Glucose-Capillary: 248 mg/dL — ABNORMAL HIGH (ref 65–99)

## 2015-12-29 LAB — BASIC METABOLIC PANEL
Anion gap: 7 (ref 5–15)
BUN: 43 mg/dL — AB (ref 6–20)
CHLORIDE: 106 mmol/L (ref 101–111)
CO2: 27 mmol/L (ref 22–32)
Calcium: 8.2 mg/dL — ABNORMAL LOW (ref 8.9–10.3)
Creatinine, Ser: 1.84 mg/dL — ABNORMAL HIGH (ref 0.44–1.00)
GFR calc Af Amer: 28 mL/min — ABNORMAL LOW (ref >60)
GFR calc non Af Amer: 24 mL/min — ABNORMAL LOW (ref >60)
GLUCOSE: 384 mg/dL — AB (ref 65–99)
POTASSIUM: 4 mmol/L (ref 3.5–5.1)
Sodium: 140 mmol/L (ref 135–145)

## 2015-12-29 LAB — HEMOGLOBIN AND HEMATOCRIT, BLOOD
HEMATOCRIT: 24.7 % — AB (ref 35.0–47.0)
HEMOGLOBIN: 8.1 g/dL — AB (ref 12.0–16.0)

## 2015-12-29 LAB — PREPARE RBC (CROSSMATCH)

## 2015-12-29 LAB — SURGICAL PATHOLOGY

## 2015-12-29 LAB — HEMOGLOBIN: Hemoglobin: 7.2 g/dL — ABNORMAL LOW (ref 12.0–16.0)

## 2015-12-29 MED ORDER — INSULIN ASPART 100 UNIT/ML ~~LOC~~ SOLN
10.0000 [IU] | Freq: Three times a day (TID) | SUBCUTANEOUS | Status: DC
  Administered 2015-12-29 (×2): 10 [IU] via SUBCUTANEOUS
  Filled 2015-12-29 (×2): qty 10

## 2015-12-29 MED ORDER — PANTOPRAZOLE SODIUM 40 MG PO TBEC
40.0000 mg | DELAYED_RELEASE_TABLET | Freq: Two times a day (BID) | ORAL | Status: DC
  Administered 2015-12-29 – 2016-01-04 (×11): 40 mg via ORAL
  Filled 2015-12-29 (×13): qty 1

## 2015-12-29 MED ORDER — BACLOFEN 1 MG/ML ORAL SUSPENSION: 10.0000 mg | Freq: Once | ORAL | Status: DC

## 2015-12-29 MED ORDER — SODIUM CHLORIDE 0.9 % IV SOLN
Freq: Once | INTRAVENOUS | Status: AC
  Administered 2015-12-29: 14:00:00 via INTRAVENOUS

## 2015-12-29 MED ORDER — INSULIN ASPART 100 UNIT/ML ~~LOC~~ SOLN
16.0000 [IU] | Freq: Once | SUBCUTANEOUS | Status: AC
Start: 2015-12-29 — End: 2015-12-29
  Administered 2015-12-29: 16 [IU] via SUBCUTANEOUS
  Filled 2015-12-29: qty 16

## 2015-12-29 MED ORDER — ACETAMINOPHEN 325 MG PO TABS
650.0000 mg | ORAL_TABLET | Freq: Four times a day (QID) | ORAL | Status: DC | PRN
  Administered 2015-12-30 – 2016-01-04 (×8): 650 mg via ORAL
  Filled 2015-12-29 (×8): qty 2

## 2015-12-29 MED ORDER — INSULIN GLARGINE 100 UNIT/ML ~~LOC~~ SOLN
25.0000 [IU] | Freq: Every day | SUBCUTANEOUS | Status: DC
  Administered 2015-12-29: 25 [IU] via SUBCUTANEOUS
  Filled 2015-12-29 (×2): qty 0.25

## 2015-12-29 MED ORDER — DARBEPOETIN ALFA 40 MCG/0.4ML IJ SOSY
40.0000 ug | PREFILLED_SYRINGE | Freq: Once | INTRAMUSCULAR | Status: DC
  Filled 2015-12-29: qty 0.4

## 2015-12-29 MED ORDER — FUROSEMIDE 40 MG PO TABS
40.0000 mg | ORAL_TABLET | Freq: Every day | ORAL | Status: DC
  Administered 2015-12-30 – 2016-01-04 (×6): 40 mg via ORAL
  Filled 2015-12-29 (×6): qty 1

## 2015-12-29 NOTE — Care Management Important Message (Signed)
Important Message  Patient Details  Name: Jill York MRN: [id number] Date of Birth: 06/28/1930   Medicare Important Message Given:  Yes    Juliann Pulse A Jessicah Croll 12/29/2015, 11:24 AM

## 2015-12-29 NOTE — Progress Notes (Signed)
Dr. Darvin Neighbours notified of BP 125/39 and asked if metoprolol is to be held or given. Awaiting response.

## 2015-12-29 NOTE — Consult Note (Signed)
Subjective: Patient seen for melena, GI BLeeding.  No n/v. Some mild discomfort in the lower abdomen "passing gas".  One bm in the  Past 2 days, black.   Objective: Vital signs in last 24 hours: Temp:  [97.6 F (36.4 C)-98.2 F (36.8 C)] 98 F (36.7 C) (07/07 1600) Pulse Rate:  [75-80] 78 (07/07 1600) Resp:  [16-20] 20 (07/07 1600) BP: (108-136)/(39-80) 127/52 mmHg (07/07 1600) SpO2:  [94 %-100 %] 99 % (07/07 1600) Blood pressure 127/52, pulse 78, temperature 98 F (36.7 C), temperature source Oral, resp. rate 20, height 5\' 4"  (1.626 m), weight 116.62 kg (257 lb 1.6 oz), SpO2 99 %.   Intake/Output from previous day: 07/06 0701 - 07/07 0700 In: 1701.5 [P.O.:960; I.V.:741.5] Out: 2850 [Urine:2850]  Intake/Output this shift: Total I/O In: 840 [P.O.:240; Blood:600] Out: 400 [Urine:400]   General appearance:  Well appearing 85 f no distress.  Resp:  bcta Cardio:  rrr GI:  Obese, soft, nontender, unable to palpate internal organs.  Extremities:   2+ lee/pitting   Lab Results: Results for orders placed or performed during the hospital encounter of 12/25/15 (from the past 24 hour(s))  Hemoglobin     Status: Abnormal   Collection Time: 12/28/15  5:06 PM  Result Value Ref Range   Hemoglobin 7.7 (L) 12.0 - 16.0 g/dL  Glucose, capillary     Status: Abnormal   Collection Time: 12/28/15 11:50 PM  Result Value Ref Range   Glucose-Capillary 351 (H) 65 - 99 mg/dL  Basic metabolic panel     Status: Abnormal   Collection Time: 12/29/15  4:34 AM  Result Value Ref Range   Sodium 140 135 - 145 mmol/L   Potassium 4.0 3.5 - 5.1 mmol/L   Chloride 106 101 - 111 mmol/L   CO2 27 22 - 32 mmol/L   Glucose, Bld 384 (H) 65 - 99 mg/dL   BUN 43 (H) 6 - 20 mg/dL   Creatinine, Ser 1.84 (H) 0.44 - 1.00 mg/dL   Calcium 8.2 (L) 8.9 - 10.3 mg/dL   GFR calc non Af Amer 24 (L) >60 mL/min   GFR calc Af Amer 28 (L) >60 mL/min   Anion gap 7 5 - 15  Hemoglobin     Status: Abnormal   Collection Time:  12/29/15  4:34 AM  Result Value Ref Range   Hemoglobin 7.2 (L) 12.0 - 16.0 g/dL  Glucose, capillary     Status: Abnormal   Collection Time: 12/29/15  5:55 AM  Result Value Ref Range   Glucose-Capillary 406 (H) 65 - 99 mg/dL  Glucose, capillary     Status: Abnormal   Collection Time: 12/29/15  6:03 AM  Result Value Ref Range   Glucose-Capillary 405 (H) 65 - 99 mg/dL  Glucose, capillary     Status: Abnormal   Collection Time: 12/29/15  7:50 AM  Result Value Ref Range   Glucose-Capillary 303 (H) 65 - 99 mg/dL  Glucose, capillary     Status: Abnormal   Collection Time: 12/29/15 11:21 AM  Result Value Ref Range   Glucose-Capillary 269 (H) 65 - 99 mg/dL   Comment 1 Notify RN   Prepare RBC     Status: None   Collection Time: 12/29/15 11:48 AM  Result Value Ref Range   Order Confirmation ORDER PROCESSED BY BLOOD BANK   Type and screen [facility]     Status: None (Preliminary result)   Collection Time: 12/29/15 11:48 AM  Result Value Ref Range  ABO/RH(D) A POS    Antibody Screen NEG    Sample Expiration 01/01/2016    Unit Number [id number]    Blood Component Type RED CELLS,LR    Unit division 00    Status of Unit ISSUED    Transfusion Status OK TO TRANSFUSE    Crossmatch Result Compatible   Glucose, capillary     Status: Abnormal   Collection Time: 12/29/15  1:57 PM  Result Value Ref Range   Glucose-Capillary 221 (H) 65 - 99 mg/dL  Glucose, capillary     Status: Abnormal   Collection Time: 12/29/15  4:37 PM  Result Value Ref Range   Glucose-Capillary 248 (H) 65 - 99 mg/dL      Recent Labs  12/28/15 0426 12/28/15 1706 12/29/15 0434  HGB 8.0* 7.7* 7.2*   BMET  Recent Labs  12/29/15 0434  NA 140  K 4.0  CL 106  CO2 27  GLUCOSE 384*  BUN 43*  CREATININE 1.84*  CALCIUM 8.2*   LFT No results for input(s): PROT, ALBUMIN, AST, ALT, ALKPHOS, BILITOT, BILIDIR, IBILI in the last 72 hours. PT/INR No results for input(s): LABPROT, INR in the  last 72 hours. Hepatitis Panel No results for input(s): HEPBSAG, HCVAB, HEPAIGM, HEPBIGM in the last 72 hours. C-Diff No results for input(s): CDIFFTOX in the last 72 hours. No results for input(s): CDIFFPCR in the last 72 hours.   Studies/Results: No results found.  Scheduled Inpatient Medications:   Marland Kitchen Darbepoetin Alfa  40 mcg Subcutaneous Once  . fluticasone  1 spray Each Nare Daily  . [START ON 12/30/2015] furosemide  40 mg Oral Daily  . gabapentin  300 mg Oral BID  . insulin aspart  0-9 Units Subcutaneous TID WC  . insulin aspart  10 Units Subcutaneous TID WC  . insulin glargine  25 Units Subcutaneous QHS  . latanoprost  1 drop Both Eyes QHS  . levothyroxine  137 mcg Oral Once per day on Sun Mon Tue Wed Thu Sat  . levothyroxine  274 mcg Oral Q Fri  . metoprolol tartrate  12.5 mg Oral BID  . mometasone-formoterol  2 puff Inhalation BID  . montelukast  10 mg Oral QHS  . nystatin   Topical BID  . pantoprazole  40 mg Oral BID AC  . sodium chloride flush  3 mL Intravenous Q12H  . venlafaxine  75 mg Oral BID    Continuous Inpatient Infusions:     PRN Inpatient Medications:  acetaminophen, albuterol, baclofen, nitroGLYCERIN  Miscellaneous:   Assessment:  1) melena- one bm since egd 2 d ago.  Old black material on DRE this evening. EGD showing erosive gasttritis and doudenal polypoid mass. Pathology on this showing hyperplastic/metaplastic reactive changes, no dysplasia or malignancy.   Plan:  1) patient currently being transfused.  If she continues to show drift of hgb may need to do colonoscopy and sbs/vce.  Continue current and observation.  Dr Vira Agar covering over the weekend.   Lollie Sails MD 12/29/2015, 4:50 PM

## 2015-12-29 NOTE — Progress Notes (Signed)
Notified Dr. Estanislado Pandy of CBG of 406 and 405. Discussed both insulin orders that are currently active and that the pt received 10units of lantis last night. Ordered a one time dose of 16 units for sliding scale.

## 2015-12-29 NOTE — Clinical Social Work Note (Signed)
Patient not discharging today and Seth Bake at [facility] notified. Shela Leff MSW,LCSW [phone]

## 2015-12-29 NOTE — Progress Notes (Signed)
[place] [facility] [place] NAME: Jill York    MR#:  [id number]  DATE OF BIRTH:  February 24, 1931  CHIEF COMPLAINT:   Chief Complaint  Patient presents with  . Abnormal Lab   Admitted for GI bleed. No further bleeding noticed. Mild SOB that is chronic.  EGD showed Duodenal adenoma. .   Review of Systems  Constitutional: Negative for fever and chills.  HENT: Negative for sore throat.   Eyes: Negative for blurred vision, double vision and pain.  Respiratory: Negative for cough, hemoptysis, shortness of breath and wheezing.   Cardiovascular: Negative for chest pain, palpitations, orthopnea and leg swelling.  Gastrointestinal: Negative for heartburn, nausea, vomiting, abdominal pain, diarrhea and constipation.  Genitourinary: Negative for dysuria and hematuria.  Musculoskeletal: Negative for back pain and joint pain.  Skin: Negative for rash.  Neurological: Negative for sensory change, speech change, focal weakness and headaches.  Endo/Heme/Allergies: Does not bruise/bleed easily.  Psychiatric/Behavioral: Negative for depression. The patient is not nervous/anxious.     VITAL SIGNS: Blood pressure 125/39, pulse 80, temperature 98 F (36.7 C), temperature source Oral, resp. rate 20, height 5\' 4"  (1.626 m), weight 116.62 kg (257 lb 1.6 oz), SpO2 99 %.  PHYSICAL EXAMINATION:   GENERAL:  82 y.o.-year-old patient lying in the bed with no acute distress. Obese.  EYES: Pupils equal, round, reactive to light and accommodation. No scleral icterus. Extraocular muscles intact.  HEENT: Head atraumatic, normocephalic. Oropharynx and nasopharynx clear.  NECK:  Supple, no jugular venous distention. No thyroid enlargement, no tenderness.  LUNGS: Normal breath sounds bilaterally, no wheezing,rhonchi or crepitation. No use of accessory muscles of respiration.  Mild basal crackles CARDIOVASCULAR: Irregular.No murmurs, rubs, or gallops.  ABDOMEN: Soft,  nontender, nondistended. Bowel sounds present. No organomegaly or mass.  EXTREMITIES: 1-2+ lower extremity and pedal edema, no cyanosis, or clubbing. Left ankle tender in medial aspect and with extension.  NEUROLOGIC: Cranial nerves II through XII are grossly intact  Muscle strength 5 out of 5 in all extremities.  Gait not checked.  PSYCHIATRIC: The patient is awake and alert SKIN: No obvious rash, lesion, or ulcer.  Brace on LLE  ORDERS/RESULTS REVIEWED:   CBC  Recent Labs Lab 12/25/15 1730 12/26/15 0950 12/27/15 0513 12/28/15 0426 12/28/15 1706 12/29/15 0434  WBC 7.5 7.8  --   --   --   --   HGB 6.7* 9.3* 8.7* 8.0* 7.7* 7.2*  HCT 21.1* 28.2*  --   --   --   --   PLT 179 163  --   --   --   --   MCV 96.4 90.8  --   --   --   --   MCH 30.6 30.0  --   --   --   --   MCHC 31.7* 33.1  --   --   --   --   RDW 18.3* 18.3*  --   --   --   --    ------------------------------------------------------------------------------------------------------------------  Chemistries   Recent Labs Lab 12/25/15 1730 12/26/15 0950 12/29/15 0434  NA 141 144 140  K 4.2 4.6 4.0  CL 107 113* 106  CO2 27 23 27   GLUCOSE 91 136* 384*  BUN 45* 39* 43*  CREATININE 1.72* 1.63* 1.84*  CALCIUM 8.5* 8.7* 8.2*  AST 23  --   --   ALT 25  --   --   ALKPHOS 72  --   --  BILITOT 0.4  --   --    ------------------------------------------------------------------------------------------------------------------ estimated creatinine clearance is 29.1 mL/min (by C-G formula based on Cr of 1.84). ------------------------------------------------------------------------------------------------------------------ No results for input(s): TSH, T4TOTAL, T3FREE, THYROIDAB in the last 72 hours.  Invalid input(s): FREET3  Cardiac Enzymes  Recent Labs Lab 12/25/15 1730 12/25/15 2038 12/26/15 0950  TROPONINI 0.04* 0.05* 0.05*    ------------------------------------------------------------------------------------------------------------------ Invalid input(s): POCBNP ---------------------------------------------------------------------------------------------------------------  RADIOLOGY: Dg Chest 2 View  12/27/2015  CLINICAL DATA:  Chronic shortness of breath. EXAM: CHEST  2 VIEW COMPARISON:  01/01/2015 FINDINGS: AP and lateral views of the chest show enlargement of the cardiopericardial silhouette, stable. There is pulmonary vascular congestion without overt pulmonary edema although interstitial prominence raises the question of a degree of interstitial edema no focal airspace consolidation. No pleural effusion. Degenerative or chronic posttraumatic changes noted in the right shoulder. Telemetry leads overlie the chest. IMPRESSION: Cardiomegaly with vascular congestion and possible interstitial edema. Electronically Signed   By: Misty Stanley M.D.   On: 12/27/2015 13:42    EKG:  Orders placed or performed during the hospital encounter of 12/15/15  . ED EKG  . ED EKG    ASSESSMENT AND PLAN:  Active Problems:   Acute blood loss anemia  # Acute on chronic posthemorrhagic symptomatic anemia Due to GI bleed - worsening Worsening today with hemoglobin of 7.2 status post 2 unit of packed red blood cell transfusion Hb Improved to 9.3 but now at 8.  Although No further bleeding noticed EGD showed no bleeding but a duodenal mass-adenoma? Hold Eliquis and aspirin Transfuse 1 unit packed RBC today. Repeat lab work in the morning. Colonoscopy?  Needs outpatient EUS for duodenal mass.  # Elevated troponin due to demand ischemia Seen by cardiology during previous admission with much higher troponins. No further investigation advised. Repeat troponin stable  # Acute on chronic diastolic CHF On Lasix. Improved  # CKD 3 Stable  # Left Comminuted fracture of the lateral malleolus Brace in place  # Atrial  fibrillation Eliquis on hold  DRUG ALLERGIES:  Allergies  Allergen Reactions  . Amlodipine Swelling    Leg swelling only   . Oxycodone Other (See Comments)    Reaction: patient states she was "out" for 2 hours and did not notice people in the area    . Demerol [Meperidine] Diarrhea and Nausea And Vomiting  . Lisinopril Cough  . Tape Rash    CODE STATUS:     Code Status Orders        Start     Ordered   12/15/15 1200  Do not attempt resuscitation (DNR)   Continuous    Question Answer Comment  In the event of cardiac or respiratory ARREST Do not call a "code blue"   In the event of cardiac or respiratory ARREST Do not perform Intubation, CPR, defibrillation or ACLS   In the event of cardiac or respiratory ARREST Use medication by any route, position, wound care, and other measures to relive pain and suffering. May use oxygen, suction and manual treatment of airway obstruction as needed for comfort.      12/15/15 1159    Code Status History    Date Active Date Inactive Code Status Order ID Comments User Context   01/01/2015 10:29 AM 01/04/2015  1:43 PM DNR [id number]  Loletha Grayer, MD ED    Advance Directive Documentation        Most Recent Value   Type of Advance Directive  Healthcare Power of [place]  Pre-existing out of facility DNR order (yellow form or pink MOST form)     "MOST" Form in Place?        TOTAL TIME TAKING CARE OF THIS PATIENT: 35 minutes.   Hillary Bow R M.D on 12/29/2015 at 11:52 AM  Between 7am to 6pm - Pager - [phone]  After 6pm go to [email or web address] - password [email or web address] [place] [facility]  Office  [phone]  CC: Primary care physician; Sharyne Peach, MD

## 2015-12-30 LAB — BASIC METABOLIC PANEL
Anion gap: 5 (ref 5–15)
BUN: 47 mg/dL — AB (ref 6–20)
CHLORIDE: 108 mmol/L (ref 101–111)
CO2: 29 mmol/L (ref 22–32)
CREATININE: 1.76 mg/dL — AB (ref 0.44–1.00)
Calcium: 8.4 mg/dL — ABNORMAL LOW (ref 8.9–10.3)
GFR calc non Af Amer: 26 mL/min — ABNORMAL LOW (ref >60)
GFR, EST AFRICAN AMERICAN: 30 mL/min — AB (ref >60)
GLUCOSE: 328 mg/dL — AB (ref 65–99)
Potassium: 4.1 mmol/L (ref 3.5–5.1)
Sodium: 142 mmol/L (ref 135–145)

## 2015-12-30 LAB — TYPE AND SCREEN
ABO/RH(D): A POS
Antibody Screen: NEGATIVE
UNIT DIVISION: 0

## 2015-12-30 LAB — GLUCOSE, CAPILLARY
GLUCOSE-CAPILLARY: 301 mg/dL — AB (ref 65–99)
Glucose-Capillary: 298 mg/dL — ABNORMAL HIGH (ref 65–99)
Glucose-Capillary: 256 mg/dL — ABNORMAL HIGH (ref 65–99)

## 2015-12-30 LAB — HEMOGLOBIN: Hemoglobin: 8.1 g/dL — ABNORMAL LOW (ref 12.0–16.0)

## 2015-12-30 MED ORDER — INSULIN GLARGINE 100 UNIT/ML ~~LOC~~ SOLN
30.0000 [IU] | Freq: Every day | SUBCUTANEOUS | Status: DC
  Administered 2015-12-30 – 2016-01-03 (×5): 30 [IU] via SUBCUTANEOUS
  Filled 2015-12-30 (×6): qty 0.3

## 2015-12-30 MED ORDER — INSULIN ASPART 100 UNIT/ML ~~LOC~~ SOLN
14.0000 [IU] | Freq: Three times a day (TID) | SUBCUTANEOUS | Status: DC
  Administered 2015-12-30 – 2016-01-01 (×6): 14 [IU] via SUBCUTANEOUS
  Filled 2015-12-30 (×6): qty 14

## 2015-12-30 NOTE — Progress Notes (Signed)
[facility] [place] NAME: Jill York    MR#:  [id number]  DATE OF BIRTH:  05-Mar-1931  CHIEF COMPLAINT:   Chief Complaint  Patient presents with  . Abnormal Lab   Admitted for GI bleed. No further bleeding noticed. Some dark stool yesterday. Mild SOB that is chronic.  EGD showed Duodenal adenoma.  Review of Systems  Constitutional: Negative for fever and chills.  HENT: Negative for sore throat.   Eyes: Negative for blurred vision, double vision and pain.  Respiratory: Negative for cough, hemoptysis, shortness of breath and wheezing.   Cardiovascular: Negative for chest pain, palpitations, orthopnea and leg swelling.  Gastrointestinal: Negative for heartburn, nausea, vomiting, abdominal pain, diarrhea and constipation.  Genitourinary: Negative for dysuria and hematuria.  Musculoskeletal: Negative for back pain and joint pain.  Skin: Negative for rash.  Neurological: Negative for sensory change, speech change, focal weakness and headaches.  Endo/Heme/Allergies: Does not bruise/bleed easily.  Psychiatric/Behavioral: Negative for depression. The patient is not nervous/anxious.     VITAL SIGNS: Blood pressure 128/65, pulse 75, temperature 98 F (36.7 C), temperature source Oral, resp. rate 20, height 5\' 4"  (1.626 m), weight 116.62 kg (257 lb 1.6 oz), SpO2 100 %.  PHYSICAL EXAMINATION:   GENERAL:  82 y.o.-year-old patient lying in the bed with no acute distress. Obese.  EYES: Pupils equal, round, reactive to light and accommodation. No scleral icterus. Extraocular muscles intact.  HEENT: Head atraumatic, normocephalic. Oropharynx and nasopharynx clear.  NECK:  Supple, no jugular venous distention. No thyroid enlargement, no tenderness.  LUNGS: Normal breath sounds bilaterally, no wheezing,rhonchi or crepitation. No use of accessory muscles of respiration.  Mild basal crackles. CARDIOVASCULAR: Irregular.No murmurs, rubs, or  gallops.  ABDOMEN: Soft, nontender, nondistended. Bowel sounds present. No organomegaly or mass.  EXTREMITIES: 1-2+ lower extremity and pedal edema, no cyanosis, or clubbing. Left ankle tender in medial aspect and with extension.  NEUROLOGIC: Cranial nerves II through XII are grossly intact  Muscle strength 5 out of 5 in all extremities.  Gait not checked.  PSYCHIATRIC: The patient is awake and alert SKIN: No obvious rash, lesion, or ulcer.  Brace on LLE  CBC  Recent Labs Lab 12/25/15 1730 12/26/15 0950  12/28/15 0426 12/28/15 1706 12/29/15 0434 12/29/15 1921 12/30/15 0415  WBC 7.5 7.8  --   --   --   --   --   --   HGB 6.7* 9.3*  < > 8.0* 7.7* 7.2* 8.1* 8.1*  HCT 21.1* 28.2*  --   --   --   --  24.7*  --   PLT 179 163  --   --   --   --   --   --   MCV 96.4 90.8  --   --   --   --   --   --   MCH 30.6 30.0  --   --   --   --   --   --   MCHC 31.7* 33.1  --   --   --   --   --   --   RDW 18.3* 18.3*  --   --   --   --   --   --   < > = values in this interval not displayed. ------------------------------------------------------------------------------------------------------------------  Chemistries   Recent Labs Lab 12/25/15 1730 12/26/15 0950 12/29/15 0434 12/30/15 0415  NA 141 144 140 142  K 4.2 4.6 4.0 4.1  CL 107 113* 106 108  CO2 27 23 27 29   GLUCOSE 91 136* 384* 328*  BUN 45* 39* 43* 47*  CREATININE 1.72* 1.63* 1.84* 1.76*  CALCIUM 8.5* 8.7* 8.2* 8.4*  AST 23  --   --   --   ALT 25  --   --   --   ALKPHOS 72  --   --   --   BILITOT 0.4  --   --   --    ------------------------------------------------------------------------------------------------------------------ estimated creatinine clearance is 30.4 mL/min (by C-G formula based on Cr of 1.76). ------------------------------------------------------------------------------------------------------------------ No results for input(s): TSH, T4TOTAL, T3FREE, THYROIDAB in the last 72 hours.  Invalid  input(s): FREET3  Cardiac Enzymes  Recent Labs Lab 12/25/15 1730 12/25/15 2038 12/26/15 0950  TROPONINI 0.04* 0.05* 0.05*   ------------------------------------------------------------------------------------------------------------------ Invalid input(s): POCBNP ---------------------------------------------------------------------------------------------------------------  RADIOLOGY: No results found.  EKG:  Orders placed or performed during the hospital encounter of 12/15/15  . ED EKG  . ED EKG    ASSESSMENT AND PLAN:  Active Problems:   Acute blood loss anemia  # Acute on chronic posthemorrhagic symptomatic anemia Due to GI bleed Total 3 units transfused. 1 unit yesterday  No further bleeding noticed EGD showed no bleeding but a duodenal Polyp. Pathology results showed hyperplasia. Hold Eliquis and aspirin Repeat lab work in the morning. Discussed with Dr. Vira Agar of GI. With only hyperplasia on the duodenal polyp no need for endoscopy ultrasound. Twice a day PPI  # Elevated troponin due to demand ischemia Seen by cardiology during previous admission with much higher troponins. No further investigation advised. Repeat troponin stable  # Acute on chronic diastolic CHF- resolved On Lasix. Improved  # CKD 3 Stable  # Left Comminuted fracture of the lateral malleolus Brace in place  # Atrial fibrillation Eliquis on hold  DRUG ALLERGIES:  Allergies  Allergen Reactions  . Amlodipine Swelling    Leg swelling only   . Oxycodone Other (See Comments)    Reaction: patient states she was "out" for 2 hours and did not notice people in the area    . Demerol [Meperidine] Diarrhea and Nausea And Vomiting  . Lisinopril Cough  . Tape Rash    CODE STATUS:     Code Status Orders        Start     Ordered   12/15/15 1200  Do not attempt resuscitation (DNR)   Continuous    Question Answer Comment  In the event of cardiac or respiratory ARREST Do not call a  "code blue"   In the event of cardiac or respiratory ARREST Do not perform Intubation, CPR, defibrillation or ACLS   In the event of cardiac or respiratory ARREST Use medication by any route, position, wound care, and other measures to relive pain and suffering. May use oxygen, suction and manual treatment of airway obstruction as needed for comfort.      12/15/15 1159    Code Status History    Date Active Date Inactive Code Status Order ID Comments User Context   01/01/2015 10:29 AM 01/04/2015  1:43 PM DNR [id number]  Loletha Grayer, MD ED    Advance Directive Documentation        Most Recent Value   Type of Advance Directive  Healthcare Power of Attorney   Pre-existing out of facility DNR order (yellow form or pink MOST form)     "MOST" Form in Place?        TOTAL TIME TAKING CARE OF THIS  PATIENT: 35 minutes.   Discussed with daughter over the phone and answered all questions. Possible discharge in 1-2 days. Hemoglobin stable.  Hillary Bow R M.D on 12/30/2015 at 12:44 PM  Between 7am to 6pm - Pager - [phone]  After 6pm go to [email or web address] - password [email or web address] [facility]  Office  [phone]  CC: Primary care physician; Sharyne Peach, MD

## 2015-12-30 NOTE — Consult Note (Signed)
Patient ate good breakfast this am.  She has hx of lymphoma.  Hgb 8.1 this morning after transfusions.  Last hgb was 7.2.  VSS afebrile. Chest clear anterior fields, abd non tender.  I spoke to her daughter about pathology of the duodenal polyp which was hyperplastic.  She may be due for Epogen shot, is followed by a hematologist.  No new GI suggestions at this time.

## 2015-12-31 LAB — BASIC METABOLIC PANEL
Anion gap: 4 — ABNORMAL LOW (ref 5–15)
BUN: 42 mg/dL — ABNORMAL HIGH (ref 6–20)
CALCIUM: 8.8 mg/dL — AB (ref 8.9–10.3)
CHLORIDE: 108 mmol/L (ref 101–111)
CO2: 31 mmol/L (ref 22–32)
CREATININE: 1.6 mg/dL — AB (ref 0.44–1.00)
GFR calc non Af Amer: 29 mL/min — ABNORMAL LOW (ref >60)
GFR, EST AFRICAN AMERICAN: 33 mL/min — AB (ref >60)
GLUCOSE: 199 mg/dL — AB (ref 65–99)
Potassium: 3.7 mmol/L (ref 3.5–5.1)
Sodium: 143 mmol/L (ref 135–145)

## 2015-12-31 LAB — GLUCOSE, CAPILLARY
GLUCOSE-CAPILLARY: 346 mg/dL — AB (ref 65–99)
Glucose-Capillary: 161 mg/dL — ABNORMAL HIGH (ref 65–99)
Glucose-Capillary: 190 mg/dL — ABNORMAL HIGH (ref 65–99)
Glucose-Capillary: 98 mg/dL (ref 65–99)

## 2015-12-31 LAB — HEMOGLOBIN: Hemoglobin: 8.6 g/dL — ABNORMAL LOW (ref 12.0–16.0)

## 2015-12-31 MED ORDER — METOPROLOL SUCCINATE ER 25 MG PO TB24
12.5000 mg | ORAL_TABLET | Freq: Every day | ORAL | Status: DC
  Administered 2016-01-01 – 2016-01-04 (×4): 12.5 mg via ORAL
  Filled 2015-12-31 (×4): qty 1

## 2015-12-31 NOTE — Consult Note (Signed)
Pt doing well, eating regular breakfast.  Hgb 8.6, VSS afebrile, creat 1.6 BUN 42.  No new complaints.  Dr. Gustavo Lah to be back tomorrow.  No new suggestions.

## 2015-12-31 NOTE — Progress Notes (Signed)
[facility] [place] NAME: Jill York    MR#:  [id number]  DATE OF BIRTH:  Dec 01, 1930  CHIEF COMPLAINT:   Chief Complaint  Patient presents with  . Abnormal Lab   Admitted for GI bleed. Was on Eliquis for Afib. EGD showed duodenal adenoma No further bleeding noticed.  No dark stools  Review of Systems  Constitutional: Negative for fever and chills.  HENT: Negative for sore throat.   Eyes: Negative for blurred vision, double vision and pain.  Respiratory: Negative for cough, hemoptysis, shortness of breath and wheezing.   Cardiovascular: Negative for chest pain, palpitations, orthopnea and leg swelling.  Gastrointestinal: Negative for heartburn, nausea, vomiting, abdominal pain, diarrhea and constipation.  Genitourinary: Negative for dysuria and hematuria.  Musculoskeletal: Negative for back pain and joint pain.  Skin: Negative for rash.  Neurological: Negative for sensory change, speech change, focal weakness and headaches.  Endo/Heme/Allergies: Does not bruise/bleed easily.  Psychiatric/Behavioral: Negative for depression. The patient is not nervous/anxious.     VITAL SIGNS: Blood pressure 131/52, pulse 84, temperature 98.3 F (36.8 C), temperature source Oral, resp. rate 18, height 5\' 4"  (1.626 m), weight 116.62 kg (257 lb 1.6 oz), SpO2 100 %.  PHYSICAL EXAMINATION:   GENERAL:  82 y.o.-year-old patient lying in the bed with no acute distress. Obese.  EYES: Pupils equal, round, reactive to light and accommodation. No scleral icterus. Extraocular muscles intact.  HEENT: Head atraumatic, normocephalic. Oropharynx and nasopharynx clear.  NECK:  Supple, no jugular venous distention. No thyroid enlargement, no tenderness.  LUNGS: Normal breath sounds bilaterally, no wheezing,rhonchi or crepitation. No use of accessory muscles of respiration.  CARDIOVASCULAR: Irregular.No murmurs, rubs, or gallops.  ABDOMEN: Soft, nontender,  nondistended. Bowel sounds present. No organomegaly or mass.  EXTREMITIES: 1+ lower extremity and pedal edema, no cyanosis, or clubbing. Left ankle tender in medial aspect and with extension.  NEUROLOGIC: Cranial nerves II through XII are grossly intact  Muscle strength 5 out of 5 in all extremities.  Gait not checked.  PSYCHIATRIC: The patient is awake and alert SKIN: No obvious rash, lesion, or ulcer.  Brace on LLE  CBC  Recent Labs Lab 12/25/15 1730 12/26/15 0950  12/28/15 1706 12/29/15 0434 12/29/15 1921 12/30/15 0415 12/31/15 0524  WBC 7.5 7.8  --   --   --   --   --   --   HGB 6.7* 9.3*  < > 7.7* 7.2* 8.1* 8.1* 8.6*  HCT 21.1* 28.2*  --   --   --  24.7*  --   --   PLT 179 163  --   --   --   --   --   --   MCV 96.4 90.8  --   --   --   --   --   --   MCH 30.6 30.0  --   --   --   --   --   --   MCHC 31.7* 33.1  --   --   --   --   --   --   RDW 18.3* 18.3*  --   --   --   --   --   --   < > = values in this interval not displayed. ------------------------------------------------------------------------------------------------------------------  Chemistries   Recent Labs Lab 12/25/15 1730 12/26/15 0950 12/29/15 0434 12/30/15 0415 12/31/15 0524  NA 141 144 140 142 143  K 4.2 4.6 4.0 4.1 3.7  CL 107 113* 106 108 108  CO2 27 23 27 29 31   GLUCOSE 91 136* 384* 328* 199*  BUN 45* 39* 43* 47* 42*  CREATININE 1.72* 1.63* 1.84* 1.76* 1.60*  CALCIUM 8.5* 8.7* 8.2* 8.4* 8.8*  AST 23  --   --   --   --   ALT 25  --   --   --   --   ALKPHOS 72  --   --   --   --   BILITOT 0.4  --   --   --   --    ------------------------------------------------------------------------------------------------------------------ estimated creatinine clearance is 33.4 mL/min (by C-G formula based on Cr of 1.6). ------------------------------------------------------------------------------------------------------------------ No results for input(s): TSH, T4TOTAL, T3FREE, THYROIDAB in the  last 72 hours.  Invalid input(s): FREET3  Cardiac Enzymes  Recent Labs Lab 12/25/15 1730 12/25/15 2038 12/26/15 0950  TROPONINI 0.04* 0.05* 0.05*   ------------------------------------------------------------------------------------------------------------------ Invalid input(s): POCBNP ---------------------------------------------------------------------------------------------------------------  RADIOLOGY: No results found.  EKG:  Orders placed or performed during the hospital encounter of 12/15/15  . ED EKG  . ED EKG    ASSESSMENT AND PLAN:  Active Problems:   Acute blood loss anemia  # Acute on chronic posthemorrhagic symptomatic anemia Due to GI bleed Total 3 units transfused. 1 unit yesterday  No further bleeding noticed EGD showed no bleeding but a duodenal Polyp. Pathology results showed hyperplasia. Hold Eliquis and aspirin Discussed with Dr. Vira Agar of GI. With only hyperplasia on the duodenal polyp no need for endoscopy ultrasound which was thought to be needed earlier. Twice a day PPI  Likely d/c tomorrow if HB stable  # Elevated troponin due to demand ischemia Seen by cardiology during previous admission with much higher troponins. No further investigation advised. Repeat troponin stable  # Acute on chronic diastolic CHF- resolved On Lasix. Improved  # CKD 3 Stable  # Left Comminuted fracture of the lateral malleolus Brace in place  # Atrial fibrillation Eliquis on hold  DRUG ALLERGIES:  Allergies  Allergen Reactions  . Amlodipine Swelling    Leg swelling only   . Oxycodone Other (See Comments)    Reaction: patient states she was "out" for 2 hours and did not notice people in the area    . Demerol [Meperidine] Diarrhea and Nausea And Vomiting  . Lisinopril Cough  . Tape Rash    CODE STATUS:     Code Status Orders        Start     Ordered   12/15/15 1200  Do not attempt resuscitation (DNR)   Continuous    Question Answer  Comment  In the event of cardiac or respiratory ARREST Do not call a "code blue"   In the event of cardiac or respiratory ARREST Do not perform Intubation, CPR, defibrillation or ACLS   In the event of cardiac or respiratory ARREST Use medication by any route, position, wound care, and other measures to relive pain and suffering. May use oxygen, suction and manual treatment of airway obstruction as needed for comfort.      12/15/15 1159    Code Status History    Date Active Date Inactive Code Status Order ID Comments User Context   01/01/2015 10:29 AM 01/04/2015  1:43 PM DNR [id number]  Loletha Grayer, MD ED    Advance Directive Documentation        Most Recent Value   Type of Advance Directive  Healthcare Power of Attorney   Pre-existing out of facility DNR order (yellow  form or pink MOST form)     "MOST" Form in Place?        TOTAL TIME TAKING CARE OF THIS PATIENT: 35 minutes.   Discussed with daughter over the phone and answered all questions. Possible discharge in 1-2 days. Hemoglobin stable.  Hillary Bow R M.D on 12/31/2015 at 10:00 AM  Between 7am to 6pm - Pager - [phone]  After 6pm go to [email or web address] - password [email or web address] [facility]  Office  [phone]  CC: Primary care physician; Sharyne Peach, MD

## 2016-01-01 LAB — GLUCOSE, CAPILLARY
GLUCOSE-CAPILLARY: 344 mg/dL — AB (ref 65–99)
Glucose-Capillary: 296 mg/dL — ABNORMAL HIGH (ref 65–99)
Glucose-Capillary: 297 mg/dL — ABNORMAL HIGH (ref 65–99)
Glucose-Capillary: 244 mg/dL — ABNORMAL HIGH (ref 65–99)

## 2016-01-01 LAB — HEMOGLOBIN: HEMOGLOBIN: 8.8 g/dL — AB (ref 12.0–16.0)

## 2016-01-01 MED ORDER — INSULIN ASPART 100 UNIT/ML ~~LOC~~ SOLN
14.0000 [IU] | Freq: Three times a day (TID) | SUBCUTANEOUS | Status: DC
  Administered 2016-01-01 – 2016-01-04 (×7): 14 [IU] via SUBCUTANEOUS
  Filled 2016-01-01 (×7): qty 14

## 2016-01-01 MED ORDER — INSULIN ASPART 100 UNIT/ML ~~LOC~~ SOLN
0.0000 [IU] | Freq: Three times a day (TID) | SUBCUTANEOUS | Status: DC
  Administered 2016-01-01: 5 [IU] via SUBCUTANEOUS
  Administered 2016-01-02: 2 [IU] via SUBCUTANEOUS
  Administered 2016-01-02 (×2): 5 [IU] via SUBCUTANEOUS
  Filled 2016-01-01: qty 5
  Filled 2016-01-01: qty 2
  Filled 2016-01-01 (×2): qty 5

## 2016-01-01 MED ORDER — INSULIN ASPART 100 UNIT/ML ~~LOC~~ SOLN
0.0000 [IU] | Freq: Every day | SUBCUTANEOUS | Status: DC
  Administered 2016-01-01: 2 [IU] via SUBCUTANEOUS
  Filled 2016-01-01: qty 2

## 2016-01-01 NOTE — Consult Note (Signed)
Subjective: Patient seen for melena. Denies nausea vomiting or abdominal pain. No bowel movement recorded for 2 days.  Objective: Vital signs in last 24 hours: Temp:  [97.6 F (36.4 C)-98.4 F (36.9 C)] 97.8 F (36.6 C) (07/10 1241) Pulse Rate:  [88-97] 88 (07/10 1241) Resp:  [18-19] 18 (07/10 1241) BP: (100-163)/(53-68) 100/59 mmHg (07/10 1241) SpO2:  [93 %-100 %] 93 % (07/10 1241) Blood pressure 100/59, pulse 88, temperature 97.8 F (36.6 C), temperature source Oral, resp. rate 18, height 5\' 4"  (1.626 m), weight 116.62 kg (257 lb 1.6 oz), SpO2 93 %.   Intake/Output from previous day: 07/09 0701 - 07/10 0700 In: 480 [P.O.:480] Out: 3950 [Urine:3950]  Intake/Output this shift: Total I/O In: 680 [P.O.:680] Out: 1350 [Urine:1350]   General appearance:  82 year old female no distress Resp:  Bilaterally mild crackles in the bases. Cardio:  Regular rate and rhythm without rub or gallop GI:  Soft nontender nondistended bowel sounds positive normoactive Extremities:  No clubbing cyanosis or edema   Lab Results: Results for orders placed or performed during the hospital encounter of 12/25/15 (from the past 24 hour(s))  Glucose, capillary     Status: None   Collection Time: 12/31/15  4:40 PM  Result Value Ref Range   Glucose-Capillary 98 65 - 99 mg/dL  Glucose, capillary     Status: Abnormal   Collection Time: 12/31/15  9:01 PM  Result Value Ref Range   Glucose-Capillary 346 (H) 65 - 99 mg/dL  Hemoglobin     Status: Abnormal   Collection Time: 01/01/16  5:52 AM  Result Value Ref Range   Hemoglobin 8.8 (L) 12.0 - 16.0 g/dL  Glucose, capillary     Status: Abnormal   Collection Time: 01/01/16  7:19 AM  Result Value Ref Range   Glucose-Capillary 344 (H) 65 - 99 mg/dL   Comment 1 Notify RN   Glucose, capillary     Status: Abnormal   Collection Time: 01/01/16 11:35 AM  Result Value Ref Range   Glucose-Capillary 296 (H) 65 - 99 mg/dL      Recent Labs  12/29/15 1921  12/30/15 0415 12/31/15 0524 01/01/16 0552  HGB 8.1* 8.1* 8.6* 8.8*  HCT 24.7*  --   --   --    BMET  Recent Labs  12/30/15 0415 12/31/15 0524  NA 142 143  K 4.1 3.7  CL 108 108  CO2 29 31  GLUCOSE 328* 199*  BUN 47* 42*  CREATININE 1.76* 1.60*  CALCIUM 8.4* 8.8*   LFT No results for input(s): PROT, ALBUMIN, AST, ALT, ALKPHOS, BILITOT, BILIDIR, IBILI in the last 72 hours. PT/INR No results for input(s): LABPROT, INR in the last 72 hours. Hepatitis Panel No results for input(s): HEPBSAG, HCVAB, HEPAIGM, HEPBIGM in the last 72 hours. C-Diff No results for input(s): CDIFFTOX in the last 72 hours. No results for input(s): CDIFFPCR in the last 72 hours.   Studies/Results: No results found.  Scheduled Inpatient Medications:   Marland Kitchen Darbepoetin Alfa  40 mcg Subcutaneous Once  . fluticasone  1 spray Each Nare Daily  . furosemide  40 mg Oral Daily  . gabapentin  300 mg Oral BID  . insulin aspart  0-5 Units Subcutaneous QHS  . insulin aspart  0-9 Units Subcutaneous TID WC  . insulin aspart  14 Units Subcutaneous TID WC  . insulin glargine  30 Units Subcutaneous QHS  . latanoprost  1 drop Both Eyes QHS  . levothyroxine  137 mcg Oral Once per day  on Sun Mon Tue Wed Thu Sat  . levothyroxine  274 mcg Oral Q Fri  . metoprolol succinate  12.5 mg Oral Daily  . mometasone-formoterol  2 puff Inhalation BID  . montelukast  10 mg Oral QHS  . nystatin   Topical BID  . pantoprazole  40 mg Oral BID AC  . sodium chloride flush  3 mL Intravenous Q12H  . venlafaxine  75 mg Oral BID    Continuous Inpatient Infusions:     PRN Inpatient Medications:  acetaminophen, albuterol, baclofen, nitroGLYCERIN  Miscellaneous:   Assessment:  1. Melena, likely related to erosive gastritis. 2. Atypical lesion in the duodenal bulb consistent with hyperplastic/metaplastic reactive changes. No evidence of dysplasia or malignancy. Negative for Helicobacter pylori.  Plan:  Continue twice a day PPI  as an outpatient. We'll need GI follow-up in about 10-14 days. Would hold anticoagulation until then. We will likely need to arrange for repeat upper scope at that time. Discussed with Dr. Carlynn Spry.  Lollie Sails MD 01/01/2016, 3:58 PM

## 2016-01-01 NOTE — Progress Notes (Signed)
[facility] [place] NAME: Jill Manne    MR#:  [id number]  DATE OF BIRTH:  December 06, 1930  CHIEF COMPLAINT:   Chief Complaint  Patient presents with  . Abnormal Lab   Having bad day (can't give details why). Hb 8.8, had BM y'day  Review of Systems  Constitutional: Negative for fever and chills.  HENT: Negative for sore throat.   Eyes: Negative for blurred vision, double vision and pain.  Respiratory: Negative for cough, hemoptysis, shortness of breath and wheezing.   Cardiovascular: Negative for chest pain, palpitations, orthopnea and leg swelling.  Gastrointestinal: Negative for heartburn, nausea, vomiting, abdominal pain, diarrhea and constipation.  Genitourinary: Negative for dysuria and hematuria.  Musculoskeletal: Negative for back pain and joint pain.  Skin: Negative for rash.  Neurological: Negative for sensory change, speech change, focal weakness and headaches.  Endo/Heme/Allergies: Does not bruise/bleed easily.  Psychiatric/Behavioral: Negative for depression. The patient is not nervous/anxious.     VITAL SIGNS: Blood pressure 100/59, pulse 88, temperature 97.8 F (36.6 C), temperature source Oral, resp. rate 18, height 5\' 4"  (1.626 m), weight 116.62 kg (257 lb 1.6 oz), SpO2 93 %.  PHYSICAL EXAMINATION:   GENERAL:  82 y.o.-year-old patient lying in the bed with no acute distress. Obese.  EYES: Pupils equal, round, reactive to light and accommodation. No scleral icterus. Extraocular muscles intact.  HEENT: Head atraumatic, normocephalic. Oropharynx and nasopharynx clear.  NECK:  Supple, no jugular venous distention. No thyroid enlargement, no tenderness.  LUNGS: Normal breath sounds bilaterally, no wheezing,rhonchi or crepitation. No use of accessory muscles of respiration.  CARDIOVASCULAR: Irregular.No murmurs, rubs, or gallops.  ABDOMEN: Soft, nontender, nondistended. Bowel sounds present. No organomegaly or mass.   EXTREMITIES: 1+ lower extremity and pedal edema, no cyanosis, or clubbing. Left ankle tender in medial aspect and with extension.  NEUROLOGIC: Cranial nerves II through XII are grossly intact  Muscle strength 5 out of 5 in all extremities.  Gait not checked.  PSYCHIATRIC: The patient is awake and alert SKIN: No obvious rash, lesion, or ulcer.  Brace on LLE  CBC  Recent Labs Lab 12/25/15 1730 12/26/15 0950  12/29/15 0434 12/29/15 1921 12/30/15 0415 12/31/15 0524 01/01/16 0552  WBC 7.5 7.8  --   --   --   --   --   --   HGB 6.7* 9.3*  < > 7.2* 8.1* 8.1* 8.6* 8.8*  HCT 21.1* 28.2*  --   --  24.7*  --   --   --   PLT 179 163  --   --   --   --   --   --   MCV 96.4 90.8  --   --   --   --   --   --   MCH 30.6 30.0  --   --   --   --   --   --   MCHC 31.7* 33.1  --   --   --   --   --   --   RDW 18.3* 18.3*  --   --   --   --   --   --   < > = values in this interval not displayed. ------------------------------------------------------------------------------------------------------------------  Chemistries   Recent Labs Lab 12/25/15 1730 12/26/15 0950 12/29/15 0434 12/30/15 0415 12/31/15 0524  NA 141 144 140 142 143  K 4.2 4.6 4.0 4.1 3.7  CL 107 113* 106 108 108  CO2 27  23 27 29 31   GLUCOSE 91 136* 384* 328* 199*  BUN 45* 39* 43* 47* 42*  CREATININE 1.72* 1.63* 1.84* 1.76* 1.60*  CALCIUM 8.5* 8.7* 8.2* 8.4* 8.8*  AST 23  --   --   --   --   ALT 25  --   --   --   --   ALKPHOS 72  --   --   --   --   BILITOT 0.4  --   --   --   --     ASSESSMENT AND PLAN:  Active Problems:   Acute blood loss anemia  # Acute on chronic posthemorrhagic symptomatic anemia Due to GI bleed Total 3 units transfused, Hb 8.8 and stable  No further bleeding noticed EGD showed no bleeding but a duodenal Polyp. Pathology results showed hyperplasia. Hold Eliquis and aspirin Until f/up by GI Discussed with Dr. Gustavo Lah of GI. With only hyperplasia on the duodenal polyp no need for  endoscopy ultrasound which was thought to be needed earlier. Twice a day PPI Likely d/c tomorrow if HB stable  # Elevated troponin due to demand ischemia Seen by cardiology during previous admission with much higher troponins. No further investigation advised. Repeat troponin stable  # Acute on chronic diastolic CHF- resolved On Lasix. Improved  # CKD 3 Stable  # Left Comminuted fracture of the lateral malleolus Brace in place  # Atrial fibrillation Eliquis on hold  DRUG ALLERGIES:  Allergies  Allergen Reactions  . Amlodipine Swelling    Leg swelling only   . Oxycodone Other (See Comments)    Reaction: patient states she was "out" for 2 hours and did not notice people in the area    . Demerol [Meperidine] Diarrhea and Nausea And Vomiting  . Lisinopril Cough  . Tape Rash    CODE STATUS: DNR  TOTAL TIME TAKING CARE OF THIS PATIENT: 35 minutes.   Discussed with daughter over the phone and answered all questions. Possible discharge in AM if Hemoglobin stable.  [facility], Sherril Heyward M.D on 01/01/2016 at 4:10 PM  Between 7am to 6pm - Pager - [phone]  After 6pm go to [email or web address] - password [email or web address] [facility]  Office  [phone]  CC: Primary care physician; Sharyne Peach, MD

## 2016-01-01 NOTE — Progress Notes (Signed)
Inpatient Diabetes Program Recommendations  AACE/ADA: New Consensus Statement on Inpatient Glycemic Control (2015)  Target Ranges:  Prepandial:   less than 140 mg/dL      Peak postprandial:   less than 180 mg/dL (1-2 hours)      Critically ill patients:  140 - 180 mg/dL  Results for Jill York, Jill York (MRN [id number]) as of 01/01/2016 09:41  Ref. Range 12/31/2015 07:32 12/31/2015 11:20 12/31/2015 16:40 12/31/2015 21:01 01/01/2016 07:19  Glucose-Capillary Latest Ref Range: 65-99 mg/dL 161 (H) 190 (H) 98 346 (H) 344 (H)    Review of Glycemic Control  Diabetes history: DM2 Outpatient Diabetes medications: Lantus 54 units QHS, Humalog 20 units with breakfast, 26 units with lunch, 20 units with supper Current orders for Inpatient glycemic control: Lantus 30 units QHS, Novolog 14 units TID with meals for meal coverage, Novolog 0-9 units TID with meals  Inpatient Diabetes Program Recommendations: Correction (SSI): Please order Novolog bedtime correction scale. Insulin - Meal Coverage: In reviewing the chart, noted glucose 98 mg/dl at 16:40 on 12/31/15 and meal coverage for supper was not given. As a result, glucose up to 346 mg/dl at 21:01. NURSING: Please administer meal coverage as ordered if premeal glucose is over 80 mg/dl and patient eats at least 50% of meal.  Thanks, Barnie Alderman, RN, MSN, CDE Diabetes Coordinator Inpatient Diabetes Program [phone] (Team Pager from [place] to [place]) [phone] ([place] office) [phone] [facility] office) [phone] [facility] office)

## 2016-01-01 NOTE — Progress Notes (Signed)
Pt was getting back to bed after using the [facility] with nurse tech assistance, unable to hold and she slid to floor, no injuries noted, pt denies pain, assisted back to bed, MD notified ( Dr Claria Dice)

## 2016-01-02 LAB — CBC
HCT: 25.3 % — ABNORMAL LOW (ref 35.0–47.0)
HEMOGLOBIN: 8.3 g/dL — AB (ref 12.0–16.0)
MCH: 31.3 pg (ref 26.0–34.0)
MCHC: 32.8 g/dL (ref 32.0–36.0)
MCV: 95.3 fL (ref 80.0–100.0)
Platelets: 143 10*3/uL — ABNORMAL LOW (ref 150–440)
RBC: 2.65 MIL/uL — AB (ref 3.80–5.20)
RDW: 18.5 % — ABNORMAL HIGH (ref 11.5–14.5)
WBC: 6.5 10*3/uL (ref 3.6–11.0)

## 2016-01-02 LAB — HEMOGLOBIN AND HEMATOCRIT, BLOOD
HCT: 28 % — ABNORMAL LOW (ref 35.0–47.0)
HEMOGLOBIN: 9.2 g/dL — AB (ref 12.0–16.0)

## 2016-01-02 LAB — GLUCOSE, CAPILLARY
GLUCOSE-CAPILLARY: 253 mg/dL — AB (ref 65–99)
GLUCOSE-CAPILLARY: 310 mg/dL — AB (ref 65–99)
Glucose-Capillary: 153 mg/dL — ABNORMAL HIGH (ref 65–99)
Glucose-Capillary: 273 mg/dL — ABNORMAL HIGH (ref 65–99)
Glucose-Capillary: 273 mg/dL — ABNORMAL HIGH (ref 65–99)

## 2016-01-02 LAB — BASIC METABOLIC PANEL
ANION GAP: 5 (ref 5–15)
BUN: 36 mg/dL — ABNORMAL HIGH (ref 6–20)
CHLORIDE: 108 mmol/L (ref 101–111)
CO2: 33 mmol/L — AB (ref 22–32)
Calcium: 8.9 mg/dL (ref 8.9–10.3)
Creatinine, Ser: 1.39 mg/dL — ABNORMAL HIGH (ref 0.44–1.00)
GFR calc non Af Amer: 34 mL/min — ABNORMAL LOW (ref >60)
GFR, EST AFRICAN AMERICAN: 39 mL/min — AB (ref >60)
Glucose, Bld: 182 mg/dL — ABNORMAL HIGH (ref 65–99)
Potassium: 3.7 mmol/L (ref 3.5–5.1)
Sodium: 146 mmol/L — ABNORMAL HIGH (ref 135–145)

## 2016-01-02 LAB — PREPARE RBC (CROSSMATCH)

## 2016-01-02 MED ORDER — INSULIN ASPART 100 UNIT/ML ~~LOC~~ SOLN
0.0000 [IU] | Freq: Three times a day (TID) | SUBCUTANEOUS | Status: DC
  Administered 2016-01-04: 7 [IU] via SUBCUTANEOUS
  Filled 2016-01-02: qty 7
  Filled 2016-01-02: qty 4

## 2016-01-02 MED ORDER — SODIUM CHLORIDE 0.9 % IV SOLN
Freq: Once | INTRAVENOUS | Status: AC
  Administered 2016-01-02: 15:00:00 via INTRAVENOUS

## 2016-01-02 MED ORDER — DEXTROSE 5 % IV SOLN: INTRAVENOUS | Status: AC

## 2016-01-02 MED ORDER — INSULIN ASPART 100 UNIT/ML ~~LOC~~ SOLN
0.0000 [IU] | Freq: Every day | SUBCUTANEOUS | Status: DC
  Administered 2016-01-02: 4 [IU] via SUBCUTANEOUS
  Filled 2016-01-02: qty 5

## 2016-01-02 NOTE — Progress Notes (Signed)
Initial Heart Failure Clinic appointment scheduled for January 18, 2016 at 11:00am. Thank you.

## 2016-01-02 NOTE — Care Management Important Message (Signed)
Important Message  Patient Details  Name: Jill York MRN: [id number] Date of Birth: [birth year]   Medicare Important Message Given:  Yes    Juliann Pulse A Drexler Maland 01/02/2016, 2:27 PM

## 2016-01-02 NOTE — Progress Notes (Signed)
[place] at [place] NAME: Jill York    MR#:  [id number]  DATE OF BIRTH:  28-Feb-1931  CHIEF COMPLAINT:   Chief Complaint  Patient presents with  . Abnormal Lab   Sitting in chair. Hb dropped from 8.8-> 8.3, family prefers if we ca keep her and give transfusion  Review of Systems  Constitutional: Negative for fever and chills.  HENT: Negative for sore throat.   Eyes: Negative for blurred vision, double vision and pain.  Respiratory: Negative for cough, hemoptysis, shortness of breath and wheezing.   Cardiovascular: Negative for chest pain, palpitations, orthopnea and leg swelling.  Gastrointestinal: Negative for heartburn, nausea, vomiting, abdominal pain, diarrhea and constipation.  Genitourinary: Negative for dysuria and hematuria.  Musculoskeletal: Negative for back pain and joint pain.  Skin: Negative for rash.  Neurological: Negative for sensory change, speech change, focal weakness and headaches.  Endo/Heme/Allergies: Does not bruise/bleed easily.  Psychiatric/Behavioral: Negative for depression. The patient is not nervous/anxious.     VITAL SIGNS: Blood pressure 131/69, pulse 94, temperature 97.5 F (36.4 C), temperature source Oral, resp. rate 20, height 5\' 4"  (1.626 m), weight 116.62 kg (257 lb 1.6 oz), SpO2 98 %.  PHYSICAL EXAMINATION:   GENERAL:  82 y.o.-year-old patient lying in the bed with no acute distress. Obese.  EYES: Pupils equal, round, reactive to light and accommodation. No scleral icterus. Extraocular muscles intact.  HEENT: Head atraumatic, normocephalic. Oropharynx and nasopharynx clear.  NECK:  Supple, no jugular venous distention. No thyroid enlargement, no tenderness.  LUNGS: Normal breath sounds bilaterally, no wheezing,rhonchi or crepitation. No use of accessory muscles of respiration.  CARDIOVASCULAR: Irregular.No murmurs, rubs, or gallops.  ABDOMEN: Soft, nontender, nondistended. Bowel sounds  present. No organomegaly or mass.  EXTREMITIES: 1+ lower extremity and pedal edema, no cyanosis, or clubbing. Left ankle tender in medial aspect and with extension.  NEUROLOGIC: Cranial nerves II through XII are grossly intact  Muscle strength 5 out of 5 in all extremities.  Gait not checked.  PSYCHIATRIC: The patient is awake and alert SKIN: No obvious rash, lesion, or ulcer.  Brace on LLE  CBC  Recent Labs Lab 12/29/15 1921 12/30/15 0415 12/31/15 0524 01/01/16 0552 01/02/16 0800  WBC  --   --   --   --  6.5  HGB 8.1* 8.1* 8.6* 8.8* 8.3*  HCT 24.7*  --   --   --  25.3*  PLT  --   --   --   --  143*  MCV  --   --   --   --  95.3  MCH  --   --   --   --  31.3  MCHC  --   --   --   --  32.8  RDW  --   --   --   --  18.5*   ------------------------------------------------------------------------------------------------------------------  Chemistries   Recent Labs Lab 12/29/15 0434 12/30/15 0415 12/31/15 0524 01/02/16 0800  NA 140 142 143 146*  K 4.0 4.1 3.7 3.7  CL 106 108 108 108  CO2 27 29 31  33*  GLUCOSE 384* 328* 199* 182*  BUN 43* 47* 42* 36*  CREATININE 1.84* 1.76* 1.60* 1.39*  CALCIUM 8.2* 8.4* 8.8* 8.9    ASSESSMENT AND PLAN:  Active Problems:   Acute blood loss anemia  # Acute on chronic posthemorrhagic symptomatic anemia Due to GI bleed Total 3 units transfused, Hb 8.8->8.3 and will order 1 PRBC for  today  No further bleeding noticed EGD showed no bleeding but a duodenal Polyp. Pathology results showed hyperplasia. Hold Eliquis and aspirin Until f/up by GI Discussed with Dr. Gustavo Lah of GI. With only hyperplasia on the duodenal polyp no need for endoscopy ultrasound which was thought to be needed earlier. Twice a day PPI Likely d/c tomorrow if HB stable  * Hypernatremia - will start D5W and monitor  # Elevated troponin due to demand ischemia Seen by cardiology during previous admission with much higher troponins. No further investigation  advised. Repeat troponin stable  # Acute on chronic diastolic CHF- resolved On Lasix. Improved  # CKD 3 Stable  # Left Comminuted fracture of the lateral malleolus Brace in place  # Atrial fibrillation Eliquis on hold   DRUG ALLERGIES:  Allergies  Allergen Reactions  . Amlodipine Swelling    Leg swelling only   . Oxycodone Other (See Comments)    Reaction: patient states she was "out" for 2 hours and did not notice people in the area    . Demerol [Meperidine] Diarrhea and Nausea And Vomiting  . Lisinopril Cough  . Tape Rash    CODE STATUS: DNR  TOTAL TIME TAKING CARE OF THIS PATIENT: 35 minutes.   Discussed with daughter over the phone and answered all questions. Possible discharge in AM if Hemoglobin stable.  [facility], Teller Wakefield M.D on 01/02/2016 at 3:34 PM  Between 7am to 6pm - Pager - [phone]  After 6pm go to [email or web address] - password [email or web address] [place] [facility]  Office  [phone]  CC: Primary care physician; Sharyne Peach, MD

## 2016-01-02 NOTE — Discharge Instructions (Signed)
Heart Failure Clinic appointment on January 18, 2016 at 11:00am with Darylene Price, [place]. Please call [phone] to reschedule.

## 2016-01-02 NOTE — Progress Notes (Signed)
Inpatient Diabetes Program Recommendations  AACE/ADA: New Consensus Statement on Inpatient Glycemic Control (2015)  Target Ranges:  Prepandial:   less than 140 mg/dL      Peak postprandial:   less than 180 mg/dL (1-2 hours)      Critically ill patients:  140 - 180 mg/dL   Lab Results  Component Value Date   GLUCAP 153* 01/02/2016   HGBA1C 8.4* 01/24/2014   Review of Glycemic Control  Results for JIMMIA, SCHEDLER (MRN [id number]) as of 01/02/2016 09:12  Ref. Range 01/01/2016 07:19 01/01/2016 11:35 01/01/2016 16:49 01/01/2016 21:23 01/02/2016 07:53  Glucose-Capillary Latest Ref Range: 65-99 mg/dL 344 (H) 296 (H) 297 (H) 244 (H) 153 (H)   Diabetes history: Type 2 Outpatient Diabetes medications: Lantus 54 units qhs, Humalog 20 units with breakfast, 26 units with lunch and 20 units with supper Current orders for Inpatient glycemic control: Novolog 0-9 units tid, Novolog 0-5 units qhs, Novolog 4 units tid with meals, Lantus 30 units qhs  Inpatient Diabetes Program Recommendations: Consider increasing Novolog mealtime insulin to 10 units tid with meals.   Gentry Fitz, RN, BA, MHA, CDE Diabetes Coordinator Inpatient Diabetes Program  [phone] (Team Pager) [phone] ([place]) 01/02/2016 9:17 AM    Consider changing Novolog sensitive correction to tid with meals and add Novolog 0-5 units qhs.  Spoke to patient at the bedside- confirms she does take insulin as ordered (see above). Takes Lantus in her abdomen and Novolog in her leg. Uses a syringe and bottle.

## 2016-01-02 NOTE — Consult Note (Signed)
Subjective: Patient seen for melena. Patient has been hemodynamically stable. There was possibly some decrease in hemoglobin this morning and she is being given another unit of blood in transfusion. There is no nausea vomiting or abdominal pain. She had 1 small bowel movement over the course of the day she did have a bowel movement yesterday.  Objective: Vital signs in last 24 hours: Temp:  [97.5 F (36.4 C)-98.2 F (36.8 C)] 98.2 F (36.8 C) (07/11 1709) Pulse Rate:  [69-94] 69 (07/11 1709) Resp:  [18-24] 24 (07/11 1709) BP: (131-146)/(43-69) 145/46 mmHg (07/11 1709) SpO2:  [98 %-100 %] 100 % (07/11 1709) Blood pressure 145/46, pulse 69, temperature 98.2 F (36.8 C), temperature source Oral, resp. rate 24, height 5\' 4"  (1.626 m), weight 116.62 kg (257 lb 1.6 oz), SpO2 100 %.   Intake/Output from previous day: 07/10 0701 - 07/11 0700 In: 1163 [P.O.:1160; I.V.:3] Out: 2350 [Urine:2350]  Intake/Output this shift: Total I/O In: 480 [P.O.:480] Out: 650 [Urine:650]   General appearance:  82 year old female no distress Resp:  Occasional coarse crackle bases clear otherwise Cardio:  Regular rate and rhythm GI:  Soft nontender nondistended bowel sounds positive normoactive Extremities:  1+ to 2+ lower extremity edema bilaterally Digital rectal examination shows a black green-colored stool consistent with possible melena   Lab Results: Results for orders placed or performed during the hospital encounter of 12/25/15 (from the past 24 hour(s))  Glucose, capillary     Status: Abnormal   Collection Time: 01/01/16  9:23 PM  Result Value Ref Range   Glucose-Capillary 244 (H) 65 - 99 mg/dL  Glucose, capillary     Status: Abnormal   Collection Time: 01/02/16  7:53 AM  Result Value Ref Range   Glucose-Capillary 153 (H) 65 - 99 mg/dL  Basic metabolic panel     Status: Abnormal   Collection Time: 01/02/16  8:00 AM  Result Value Ref Range   Sodium 146 (H) 135 - 145 mmol/L   Potassium 3.7  3.5 - 5.1 mmol/L   Chloride 108 101 - 111 mmol/L   CO2 33 (H) 22 - 32 mmol/L   Glucose, Bld 182 (H) 65 - 99 mg/dL   BUN 36 (H) 6 - 20 mg/dL   Creatinine, Ser 1.39 (H) 0.44 - 1.00 mg/dL   Calcium 8.9 8.9 - 10.3 mg/dL   GFR calc non Af Amer 34 (L) >60 mL/min   GFR calc Af Amer 39 (L) >60 mL/min   Anion gap 5 5 - 15  CBC     Status: Abnormal   Collection Time: 01/02/16  8:00 AM  Result Value Ref Range   WBC 6.5 3.6 - 11.0 K/uL   RBC 2.65 (L) 3.80 - 5.20 MIL/uL   Hemoglobin 8.3 (L) 12.0 - 16.0 g/dL   HCT 25.3 (L) 35.0 - 47.0 %   MCV 95.3 80.0 - 100.0 fL   MCH 31.3 26.0 - 34.0 pg   MCHC 32.8 32.0 - 36.0 g/dL   RDW 18.5 (H) 11.5 - 14.5 %   Platelets 143 (L) 150 - 440 K/uL  Glucose, capillary     Status: Abnormal   Collection Time: 01/02/16 11:23 AM  Result Value Ref Range   Glucose-Capillary 273 (H) 65 - 99 mg/dL  Type and screen [facility]     Status: None (Preliminary result)   Collection Time: 01/02/16  1:13 PM  Result Value Ref Range   ABO/RH(D) A POS    Antibody Screen NEG  Sample Expiration 01/05/2016    Unit Number [id number]    Blood Component Type RED CELLS,LR    Unit division 00    Status of Unit ISSUED    Transfusion Status OK TO TRANSFUSE    Crossmatch Result Compatible   Prepare RBC     Status: None   Collection Time: 01/02/16  2:20 PM  Result Value Ref Range   Order Confirmation ORDER PROCESSED BY BLOOD BANK   Glucose, capillary     Status: Abnormal   Collection Time: 01/02/16  3:10 PM  Result Value Ref Range   Glucose-Capillary 273 (H) 65 - 99 mg/dL  Glucose, capillary     Status: Abnormal   Collection Time: 01/02/16  4:43 PM  Result Value Ref Range   Glucose-Capillary 253 (H) 65 - 99 mg/dL      Recent Labs  12/31/15 0524 01/01/16 0552 01/02/16 0800  WBC  --   --  6.5  HGB 8.6* 8.8* 8.3*  HCT  --   --  25.3*  PLT  --   --  143*   BMET  Recent Labs  12/31/15 0524 01/02/16 0800  NA 143 146*  K 3.7 3.7  CL 108 108   CO2 31 33*  GLUCOSE 199* 182*  BUN 42* 36*  CREATININE 1.60* 1.39*  CALCIUM 8.8* 8.9   LFT No results for input(s): PROT, ALBUMIN, AST, ALT, ALKPHOS, BILITOT, BILIDIR, IBILI in the last 72 hours. PT/INR No results for input(s): LABPROT, INR in the last 72 hours. Hepatitis Panel No results for input(s): HEPBSAG, HCVAB, HEPAIGM, HEPBIGM in the last 72 hours. C-Diff No results for input(s): CDIFFTOX in the last 72 hours. No results for input(s): CDIFFPCR in the last 72 hours.   Studies/Results: No results found.  Scheduled Inpatient Medications:   Marland Kitchen Darbepoetin Alfa  40 mcg Subcutaneous Once  . fluticasone  1 spray Each Nare Daily  . furosemide  40 mg Oral Daily  . gabapentin  300 mg Oral BID  . insulin aspart  0-5 Units Subcutaneous QHS  . insulin aspart  0-9 Units Subcutaneous TID WC  . insulin aspart  14 Units Subcutaneous TID WC  . insulin glargine  30 Units Subcutaneous QHS  . latanoprost  1 drop Both Eyes QHS  . levothyroxine  137 mcg Oral Once per day on Sun Mon Tue Wed Thu Sat  . levothyroxine  274 mcg Oral Q Fri  . metoprolol succinate  12.5 mg Oral Daily  . mometasone-formoterol  2 puff Inhalation BID  . montelukast  10 mg Oral QHS  . nystatin   Topical BID  . pantoprazole  40 mg Oral BID AC  . sodium chloride flush  3 mL Intravenous Q12H  . venlafaxine  75 mg Oral BID    Continuous Inpatient Infusions:   . dextrose      PRN Inpatient Medications:  acetaminophen, albuterol, baclofen, nitroGLYCERIN  Miscellaneous:   Assessment:  1. Patient presenting with melena and erosive gastritis. Of note is that she continues to have some black stools and did have a slight drop of hemoglobin this morning compared to yesterday morning. I'm concerned that she may still be having some amount of GI bleeding of an upper source that may have eluded initial evaluation. Will await hemoglobin tomorrow morning. We'll place her on scheduled for a repeat EGD tomorrow  afternoon.  Plan:  As above  Lollie Sails MD 01/02/2016, 6:08 PM

## 2016-01-03 ENCOUNTER — Encounter: Payer: Self-pay | Admitting: *Deleted

## 2016-01-03 ENCOUNTER — Encounter
Admission: EM | Disposition: A | Discharge status: Skilled Nursing Facility | Payer: Self-pay | Source: Home / Self Care | Attending: Internal Medicine

## 2016-01-03 ENCOUNTER — Inpatient Hospital Stay: Payer: Medicare Other | Admitting: Certified Registered"

## 2016-01-03 HISTORY — PX: ESOPHAGOGASTRODUODENOSCOPY (EGD) WITH PROPOFOL: SHX5813

## 2016-01-03 LAB — CBC
HEMATOCRIT: 26.7 % — AB (ref 35.0–47.0)
HEMOGLOBIN: 8.9 g/dL — AB (ref 12.0–16.0)
MCH: 30.9 pg (ref 26.0–34.0)
MCHC: 33.3 g/dL (ref 32.0–36.0)
MCV: 92.7 fL (ref 80.0–100.0)
Platelets: 142 10*3/uL — ABNORMAL LOW (ref 150–440)
RBC: 2.88 MIL/uL — ABNORMAL LOW (ref 3.80–5.20)
RDW: 18.6 % — AB (ref 11.5–14.5)
WBC: 6.4 10*3/uL (ref 3.6–11.0)

## 2016-01-03 LAB — TYPE AND SCREEN
ABO/RH(D): A POS
ANTIBODY SCREEN: NEGATIVE
Unit division: 0

## 2016-01-03 LAB — BASIC METABOLIC PANEL
Anion gap: 4 — ABNORMAL LOW (ref 5–15)
BUN: 34 mg/dL — ABNORMAL HIGH (ref 6–20)
CALCIUM: 8.6 mg/dL — AB (ref 8.9–10.3)
CHLORIDE: 107 mmol/L (ref 101–111)
CO2: 33 mmol/L — AB (ref 22–32)
CREATININE: 1.39 mg/dL — AB (ref 0.44–1.00)
GFR calc non Af Amer: 34 mL/min — ABNORMAL LOW (ref >60)
GFR, EST AFRICAN AMERICAN: 39 mL/min — AB (ref >60)
GLUCOSE: 203 mg/dL — AB (ref 65–99)
Potassium: 3.4 mmol/L — ABNORMAL LOW (ref 3.5–5.1)
Sodium: 144 mmol/L (ref 135–145)

## 2016-01-03 LAB — GLUCOSE, CAPILLARY
GLUCOSE-CAPILLARY: 167 mg/dL — AB (ref 65–99)
GLUCOSE-CAPILLARY: 72 mg/dL (ref 65–99)
Glucose-Capillary: 190 mg/dL — ABNORMAL HIGH (ref 65–99)
Glucose-Capillary: 110 mg/dL — ABNORMAL HIGH (ref 65–99)
Glucose-Capillary: 117 mg/dL — ABNORMAL HIGH (ref 65–99)

## 2016-01-03 SURGERY — ESOPHAGOGASTRODUODENOSCOPY (EGD) WITH PROPOFOL
Anesthesia: General

## 2016-01-03 MED ORDER — PROPOFOL 10 MG/ML IV BOLUS
INTRAVENOUS | Status: DC | PRN
  Administered 2016-01-03: 30 mg via INTRAVENOUS
  Administered 2016-01-03: 20 mg via INTRAVENOUS

## 2016-01-03 MED ORDER — MIDAZOLAM HCL 2 MG/2ML IJ SOLN
INTRAMUSCULAR | Status: DC | PRN
  Administered 2016-01-03: 1 mg via INTRAVENOUS

## 2016-01-03 MED ORDER — NYSTATIN 100000 UNIT/ML MT SUSP
5.0000 mL | Freq: Four times a day (QID) | OROMUCOSAL | Status: DC
  Administered 2016-01-03 – 2016-01-04 (×4): 500000 [IU] via ORAL
  Filled 2016-01-03 (×4): qty 5

## 2016-01-03 MED ORDER — PHENYLEPHRINE HCL 10 MG/ML IJ SOLN
INTRAMUSCULAR | Status: DC | PRN
  Administered 2016-01-03: 100 ug via INTRAVENOUS

## 2016-01-03 MED ORDER — PANTOPRAZOLE SODIUM 40 MG PO TBEC: 40.0000 mg | DELAYED_RELEASE_TABLET | Freq: Two times a day (BID) | ORAL | Status: DC

## 2016-01-03 MED ORDER — SODIUM CHLORIDE 0.9 % IV SOLN
INTRAVENOUS | Status: DC
  Administered 2016-01-03: 1000 mL via INTRAVENOUS

## 2016-01-03 MED ORDER — LIDOCAINE HCL (CARDIAC) 20 MG/ML IV SOLN
INTRAVENOUS | Status: DC | PRN
  Administered 2016-01-03: 60 mg via INTRAVENOUS

## 2016-01-03 NOTE — Transfer of Care (Signed)
Immediate Anesthesia Transfer of Care Note  Patient: Jill York  Procedure(s) Performed: Procedure(s): ESOPHAGOGASTRODUODENOSCOPY (EGD) WITH PROPOFOL (N/A)  Patient Location: PACU  Anesthesia Type:General  Level of Consciousness: awake, alert , oriented and patient cooperative  Airway & Oxygen Therapy: Patient Spontanous Breathing and Patient connected to nasal cannula oxygen  Post-op Assessment: Report given to RN, Post -op Vital signs reviewed and stable and Patient moving all extremities X 4  Post vital signs: Reviewed and stable  Last Vitals:  Filed Vitals:   01/03/16 1227 01/03/16 1243  BP: 112/44 147/44  Pulse: 71 80  Temp: 36.6 C 36.4 C  Resp: 18 20    Last Pain:  Filed Vitals:   01/03/16 1245  PainSc: 2       Patients Stated Pain Goal: 0 ([id number])  Complications: No apparent anesthesia complications

## 2016-01-03 NOTE — Progress Notes (Signed)
Inpatient Diabetes Program Recommendations  AACE/ADA: New Consensus Statement on Inpatient Glycemic Control (2015)  Target Ranges:  Prepandial:   less than 140 mg/dL      Peak postprandial:   less than 180 mg/dL (1-2 hours)      Critically ill patients:  140 - 180 mg/dL  Results for NEELI, WHITLING (MRN [id number]) as of 01/03/2016 09:48  Ref. Range 01/02/2016 07:53 01/02/2016 11:23 01/02/2016 15:10 01/02/2016 16:43 01/02/2016 22:42 01/03/2016 07:58  Glucose-Capillary Latest Ref Range: 65-99 mg/dL 153 (H) 273 (H) 273 (H) 253 (H) 310 (H) 190 (H)    Review of Glycemic Control  Diabetes history: DM2 Outpatient Diabetes medications: Lantus 54 units QHS, Humalog 20 units with breakfast, 26 units with lunch, 20 units with supper Current orders for Inpatient glycemic control: Lantus 30 units QHS, Novolog 14 units TID with meals for meal coverage, Novolog 0-20 units TID with meals, Novolog 0-5 units QHS  Inpatient Diabetes Program Recommendations: Insulin - Basal: Please consider increasing Lantus to 35 units QHS. Insulin - Meal Coverage: Patient is NPO at this time and noted she received Novolog 14 units this am at 8:39 am for meal coverage. Concerned patient may experience hypoglycemia due to receiving meal coverage and no meal consumed. Would advise to check CBGs Q1H over the next 4 hours to ensure glucose is stable. When diet is resumed, please consider increasing meal coverage to Novolog 20 units TID with meals if patient eats at least 50% of meals.  NOTE: Dimas Chyle, RN caring for patient today to make aware of concern for hypoglycemia due to receiving Novolog meal coverage and no meal consumed.  Thanks, Barnie Alderman, RN, MSN, CDE Diabetes Coordinator Inpatient Diabetes Program [phone] (Team Pager from [place] to [place]) [phone] ([place] office) [phone] [facility] office) [phone] [facility] office)

## 2016-01-03 NOTE — Consult Note (Signed)
[place] repeat EGD done at request of Dr. Gustavo Lah.  Pt had yeast in esophagus, minimal erythema in stomach antrum, I repeated biopsies of  the duodenal sessile mass and looked into second portion.  Nowhere was there any blood or melena.  Would recommend oral nystatin for 5 days.  Can be released to facility tomorrow if no further problems prohibit that.  Discussed with daughter.

## 2016-01-03 NOTE — Progress Notes (Signed)
[place] at [place] NAME: Jill York    MR#:  [id number]  DATE OF BIRTH:  03-21-1931  CHIEF COMPLAINT:   Chief Complaint  Patient presents with  . Abnormal Lab   Alert. Hb 8.9 after 1 prbc, s/p EGD showing yeast in esophagus, minimal erythema in stomach antrum  Review of Systems  Constitutional: Negative for fever and chills.  HENT: Negative for sore throat.   Eyes: Negative for blurred vision, double vision and pain.  Respiratory: Negative for cough, hemoptysis, shortness of breath and wheezing.   Cardiovascular: Negative for chest pain, palpitations, orthopnea and leg swelling.  Gastrointestinal: Negative for heartburn, nausea, vomiting, abdominal pain, diarrhea and constipation.  Genitourinary: Negative for dysuria and hematuria.  Musculoskeletal: Negative for back pain and joint pain.  Skin: Negative for rash.  Neurological: Negative for sensory change, speech change, focal weakness and headaches.  Endo/Heme/Allergies: Does not bruise/bleed easily.  Psychiatric/Behavioral: Negative for depression. The patient is not nervous/anxious.     VITAL SIGNS: Blood pressure 119/82, pulse 78, temperature 96.7 F (35.9 C), temperature source Tympanic, resp. rate 31, height 5\' 4"  (1.626 m), weight 116.62 kg (257 lb 1.6 oz), SpO2 100 %.  PHYSICAL EXAMINATION:   GENERAL:  82 y.o.-year-old patient lying in the bed with no acute distress. Obese.  EYES: Pupils equal, round, reactive to light and accommodation. No scleral icterus. Extraocular muscles intact.  HEENT: Head atraumatic, normocephalic. Oropharynx and nasopharynx clear.  NECK:  Supple, no jugular venous distention. No thyroid enlargement, no tenderness.  LUNGS: Normal breath sounds bilaterally, no wheezing,rhonchi or crepitation. No use of accessory muscles of respiration.  CARDIOVASCULAR: Irregular.No murmurs, rubs, or gallops.  ABDOMEN: Soft, nontender, nondistended. Bowel  sounds present. No organomegaly or mass.  EXTREMITIES: No pedal edema, no cyanosis, or clubbing. Left ankle tender in medial aspect and with extension.  NEUROLOGIC: Cranial nerves II through XII are grossly intact  Muscle strength 5 out of 5 in all extremities.  Gait not checked.  PSYCHIATRIC: The patient is awake and alert SKIN: No obvious rash, lesion, or ulcer.  Brace on LLE  CBC  Recent Labs Lab 12/29/15 1921  12/31/15 0524 01/01/16 0552 01/02/16 0800 01/02/16 2050 01/03/16 0759  WBC  --   --   --   --  6.5  --  6.4  HGB 8.1*  < > 8.6* 8.8* 8.3* 9.2* 8.9*  HCT 24.7*  --   --   --  25.3* 28.0* 26.7*  PLT  --   --   --   --  143*  --  142*  MCV  --   --   --   --  95.3  --  92.7  MCH  --   --   --   --  31.3  --  30.9  MCHC  --   --   --   --  32.8  --  33.3  RDW  --   --   --   --  18.5*  --  18.6*  < > = values in this interval not displayed. ------------------------------------------------------------------------------------------------------------------  Chemistries   Recent Labs Lab 12/29/15 0434 12/30/15 0415 12/31/15 0524 01/02/16 0800 01/03/16 0759  NA 140 142 143 146* 144  K 4.0 4.1 3.7 3.7 3.4*  CL 106 108 108 108 107  CO2 27 29 31  33* 33*  GLUCOSE 384* 328* 199* 182* 203*  BUN 43* 47* 42* 36* 34*  CREATININE 1.84* 1.76* 1.60* 1.39*  1.39*  CALCIUM 8.2* 8.4* 8.8* 8.9 8.6*    ASSESSMENT AND PLAN:  Active Problems:   Acute blood loss anemia  # Acute on chronic posthemorrhagic symptomatic anemia Due to GI bleed Total 4 units transfused, Hb 8.8->8.3->9.2->8.9  No further bleeding noticed 1st EGD week ago showed no bleeding but a duodenal Polyp. Pathology results showed hyperplasia. Hold Eliquis and aspirin Until f/up by GI in 2 weeks Discussed with Dr. Gustavo Lah of GI. GI repeated EGD today which showed  yeast in esophagus, minimal erythema in stomach antrum, repeated biopsies ofthe duodenal sessile mass, no bleeding.they recommend oral nystatin for 5  days. Twice a day PPI Likely d/c tomorrow if HB stable  * Hypernatremia - resolved with D5W  # Elevated troponin due to demand ischemia Seen by cardiology during previous admission with much higher troponins. No further investigation advised. Repeat troponin stable  # Acute on chronic diastolic CHF- resolved On Lasix. Improved  # CKD 3 Stable  # Left Comminuted fracture of the lateral malleolus Brace in place  # Atrial fibrillation Eliquis on hold   DRUG ALLERGIES:  Allergies  Allergen Reactions  . Amlodipine Swelling    Leg swelling only   . Oxycodone Other (See Comments)    Reaction: patient states she was "out" for 2 hours and did not notice people in the area    . Demerol [Meperidine] Diarrhea and Nausea And Vomiting  . Lisinopril Cough  . Tape Rash    CODE STATUS: DNR  TOTAL TIME TAKING CARE OF THIS PATIENT: 35 minutes.   Discussed with daughter over the phone and answered all questions. Possible discharge in AM if Hemoglobin stable.  [facility], Okie Jansson M.D on 01/03/2016 at 2:27 PM  Between 7am to 6pm - Pager - [phone]  After 6pm go to [email or web address] - password [email or web address] [place] [facility]  Office  [phone]  CC: Primary care physician; Sharyne Peach, MD

## 2016-01-03 NOTE — Op Note (Addendum)
[facility] Gastroenterology Patient Name: Jill York Procedure Date: 01/03/2016 1:03 PM MRN: [id number] Account #: [id number] Date of Birth: May 23, 1931 Admit Type: Inpatient Age: 82 Room: [facility] ENDO ROOM 4 Gender: Female Note Status: Finalized Procedure:            Upper GI endoscopy Indications:          Melena Providers:            Manya Silvas, MD Referring MD:         Rubbie Battiest. Iona Beard, MD (Referring MD) Medicines:            Propofol per Anesthesia Complications:        No immediate complications. Procedure:            Pre-Anesthesia Assessment:                       - After reviewing the risks and benefits, the patient                        was deemed in satisfactory condition to undergo the                        procedure.                       After obtaining informed consent, the endoscope was                        passed under direct vision. Throughout the procedure,                        the patient's blood pressure, pulse, and oxygen                        saturations were monitored continuously. The                        Colonoscope was introduced through the mouth, and                        advanced to the second part of duodenum. The upper GI                        endoscopy was accomplished without difficulty. The                        patient tolerated the procedure well. Findings:      Diffuse candidiasis was found in the middle third of the esophagus and       in the lower third of the esophagus.      Striped mildly erythematous mucosa without bleeding was found in the       gastric antrum.      A large infiltrative, polypoid and sessile mass with no bleeding was       found in the duodenal bulb. Biopsies were taken with a cold forceps for       histology.      The second portion of the duodenum was normal.      No blood seen anywhere in stomach or duodenum. Impression:           - Monilial esophagitis.                       -  Erythematous mucosa in the antrum.                       - Duodenal mass. Biopsied.                       - Normal second portion of the duodenum. Recommendation:       - Await pathology results. Manya Silvas, MD 01/03/2016 1:23:39 PM This report has been signed electronically. Number of Addenda: 0 Note Initiated On: 01/03/2016 1:03 PM      [facility]

## 2016-01-03 NOTE — Anesthesia Postprocedure Evaluation (Signed)
Anesthesia Post Note  Patient: MEYGAN MCDIVITT  Procedure(s) Performed: Procedure(s) (LRB): ESOPHAGOGASTRODUODENOSCOPY (EGD) WITH PROPOFOL (N/A)  Patient location during evaluation: Endoscopy Anesthesia Type: General Level of consciousness: awake and alert Pain management: pain level controlled Vital Signs Assessment: post-procedure vital signs reviewed and stable Respiratory status: spontaneous breathing, nonlabored ventilation, respiratory function stable and patient connected to nasal cannula oxygen Cardiovascular status: blood pressure returned to baseline and stable Postop Assessment: no signs of nausea or vomiting Anesthetic complications: no    Last Vitals:  Filed Vitals:   01/03/16 1353 01/03/16 1402  BP: 135/67 119/82  Pulse: 86 78  Temp:    Resp: 19 31    Last Pain:  Filed Vitals:   01/03/16 1404  PainSc: 2                  Martha Clan

## 2016-01-03 NOTE — Anesthesia Preprocedure Evaluation (Addendum)
Anesthesia Evaluation  Patient identified by MRN, date of birth, ID band Patient awake    Reviewed: Allergy & Precautions, NPO status , Patient's Chart, lab work & pertinent test results, reviewed documented beta blocker date and time   Airway Mallampati: III  TM Distance: >3 FB     Dental  (+) Chipped   Pulmonary shortness of breath, with exertion and Long-Term Oxygen Therapy, sleep apnea , COPD,  COPD inhaler and oxygen dependent, neg recent URI, former smoker,           Cardiovascular hypertension, Pt. on medications and Pt. on home beta blockers + CAD, + Past MI and +CHF  (-) Cardiac Stents and (-) CABG + dysrhythmias Atrial Fibrillation (-) Valvular Problems/Murmurs     Neuro/Psych    GI/Hepatic   Endo/Other  diabetesMorbid obesity  Renal/GU Renal disease     Musculoskeletal  (+) Arthritis ,   Abdominal   Peds  Hematology  (+) anemia ,   Anesthesia Other Findings Breathing OK. CHF. On lasix. Does not use CPAP. EKG OK. Has had 2 stents (cardiac). Echo last year 50%. O2 at nite. DNR. Pt understands it will be held until after procedure.  Reproductive/Obstetrics                            Anesthesia Physical  Anesthesia Plan  ASA: III  Anesthesia Plan: General   Post-op Pain Management:    Induction: Intravenous  Airway Management Planned: Nasal Cannula  Additional Equipment:   Intra-op Plan:   Post-operative Plan:   Informed Consent: I have reviewed the patients History and Physical, chart, labs and discussed the procedure including the risks, benefits and alternatives for the proposed anesthesia with the patient or authorized representative who has indicated his/her understanding and acceptance.     Plan Discussed with: CRNA  Anesthesia Plan Comments:         Anesthesia Quick Evaluation

## 2016-01-04 LAB — GLUCOSE, CAPILLARY
GLUCOSE-CAPILLARY: 224 mg/dL — AB (ref 65–99)
GLUCOSE-CAPILLARY: 208 mg/dL — AB (ref 65–99)

## 2016-01-04 LAB — CBC
HCT: 26.7 % — ABNORMAL LOW (ref 35.0–47.0)
HEMOGLOBIN: 8.9 g/dL — AB (ref 12.0–16.0)
MCH: 31.2 pg (ref 26.0–34.0)
MCHC: 33.4 g/dL (ref 32.0–36.0)
MCV: 93.4 fL (ref 80.0–100.0)
Platelets: 142 10*3/uL — ABNORMAL LOW (ref 150–440)
RBC: 2.86 MIL/uL — AB (ref 3.80–5.20)
RDW: 18.8 % — ABNORMAL HIGH (ref 11.5–14.5)
WBC: 5 10*3/uL (ref 3.6–11.0)

## 2016-01-04 LAB — SURGICAL PATHOLOGY

## 2016-01-04 MED ORDER — TRAMADOL HCL 50 MG PO TABS: 50.0000 mg | ORAL_TABLET | Freq: Three times a day (TID) | ORAL | Status: DC | PRN

## 2016-01-04 MED ORDER — NYSTATIN 100000 UNIT/ML MT SUSP: 5.0000 mL | Freq: Four times a day (QID) | OROMUCOSAL | Status: DC

## 2016-01-04 NOTE — Progress Notes (Signed)
Initial Nutrition Assessment  DOCUMENTATION CODES:   Morbid obesity  INTERVENTION:  -Monitor intake and cater to pt preferences   NUTRITION DIAGNOSIS:    (none at this time) related to   as evidenced by  .    GOAL:   Patient will meet greater than or equal to 90% of their needs    MONITOR:   PO intake  REASON FOR ASSESSMENT:   LOS    ASSESSMENT:      Pt admitted with anemia, GI bleed.  EGD performed and found esophagitis, duodenal mass biopsy taken.  Past Medical History  Diagnosis Date  . CHF (congestive heart failure) ([place])   . Thyroid disease   . Diabetes mellitus without complication ([place])   . Hypertension   . Hyperlipidemia   . Arrhythmia     PVC  . COPD (chronic obstructive pulmonary disease) ([place])   . Chronic kidney disease   . OSA (obstructive sleep apnea) 2011  . Anemia   . Anxiety and depression     [name]  . Lymphoma ([place])   . Osteoarthritis   . Lymphedema    Pt eating well during admission and prior to admission.    Medications reviewed: Labs reviewed  Diet Order:  Diet Carb Modified Fluid consistency:: Thin; Room service appropriate?: Yes Diet - low sodium heart healthy Diet - low sodium heart healthy  Skin:  Reviewed, no issues  Last BM:  7/11  Height:   Ht Readings from Last 1 Encounters:  12/25/15 5\' 4"  (1.626 m)    Weight: Pt reports stable wt  Wt Readings from Last 1 Encounters:  12/25/15 257 lb 1.6 oz (116.62 kg)    Ideal Body Weight:     BMI:  Body mass index is 44.11 kg/(m^2).  Estimated Nutritional Needs:   Kcal:  [id number] kcals/d  Protein:  116-139 g/d  Fluid:  >/= 1653ml/d  EDUCATION NEEDS:   No education needs identified at this time  Nasha Diss B. Zenia Resides, [place], [place] (pager) Weekend/On-Call pager [phone])

## 2016-01-04 NOTE — Care Management Important Message (Signed)
Important Message  Patient Details  Name: Jill York MRN: [id number] Date of Birth: 1931-06-01   Medicare Important Message Given:  Yes    [name] 01/04/2016, 11:19 AM

## 2016-01-04 NOTE — Progress Notes (Signed)
01/04/2016 11:15 AM  Called report to [name], receiving nurse at [facility], where patient will be discharged.  [name], RN

## 2016-01-04 NOTE — Progress Notes (Signed)
01/04/2016 12:10  Jill York to be D/C'd Rehab per MD order.  Discussed prescriptions and follow up appointments with the patient. Prescriptions given to patient, medication list explained in detail. Pt verbalized understanding.    Medication List    STOP taking these medications        ELIQUIS 2.5 MG Tabs tablet  Generic drug:  apixaban      TAKE these medications        acetaminophen 500 MG tablet  Commonly known as:  TYLENOL  Take 1,000 mg by mouth every 6 (six) hours as needed.     albuterol 108 (90 Base) MCG/ACT inhaler  Commonly known as:  PROVENTIL HFA;VENTOLIN HFA  Inhale 1 puff into the lungs every 4 (four) hours as needed for wheezing or shortness of breath.     albuterol (2.5 MG/3ML) 0.083% nebulizer solution  Commonly known as:  PROVENTIL  Take 2.5 mg by nebulization every 6 (six) hours as needed for wheezing or shortness of breath.     amLODipine 5 MG tablet  Commonly known as:  NORVASC  Take 5 mg by mouth daily.     atorvastatin 80 MG tablet  Commonly known as:  LIPITOR  Take 80 mg by mouth daily.     baclofen 10 MG tablet  Commonly known as:  LIORESAL  Take 5 mg by mouth at bedtime as needed for muscle spasms.     bisacodyl 5 MG EC tablet  Commonly known as:  DULCOLAX  Take 5 mg by mouth daily as needed for moderate constipation.     calcitRIOL 0.25 MCG capsule  Commonly known as:  ROCALTROL  Take 0.25 mcg by mouth every Monday, Wednesday, and Friday.     Cholecalciferol 1000 units tablet  Take 1,000 Units by mouth daily.     docusate sodium 100 MG capsule  Commonly known as:  COLACE  Take 100 mg by mouth daily as needed for mild constipation or moderate constipation.     esomeprazole 20 MG capsule  Commonly known as:  NEXIUM  Take 20 mg by mouth daily as needed (for acid reflux).     fluticasone 50 MCG/ACT nasal spray  Commonly known as:  FLONASE  Place 1 spray into both nostrils daily.     Fluticasone-Salmeterol 250-50 MCG/DOSE Aepb   Commonly known as:  ADVAIR  Inhale 1 puff into the lungs 2 (two) times daily.     furosemide 40 MG tablet  Commonly known as:  LASIX  Take 40 mg by mouth daily.     gabapentin 300 MG capsule  Commonly known as:  NEURONTIN  Take 300 mg by mouth 2 (two) times daily.     insulin glargine 100 UNIT/ML injection  Commonly known as:  LANTUS  Inject 54 Units into the skin at bedtime.     insulin lispro 100 UNIT/ML injection  Commonly known as:  HUMALOG  Inject 20-26 Units into the skin 3 (three) times daily with meals. Inject 20 units sub-q in morning, inject 26 units sub-q in afternoon, and inject 20 units sub-q in evening     latanoprost 0.005 % ophthalmic solution  Commonly known as:  XALATAN  Place 1 drop into both eyes at bedtime.     levothyroxine 137 MCG tablet  Commonly known as:  SYNTHROID, LEVOTHROID  Take 137-274 mcg by mouth daily before breakfast. Take 153mcg (1 tablet) daily on Mon, Tues, Wed, Thurs, Sat, and Sun. Take 246mcg (2 tabs) daily on Friday. (Take with a  glass of water at least 30 to 60 minutes before breakfast)     losartan 25 MG tablet  Commonly known as:  COZAAR  Take 25 mg by mouth daily.     metoprolol tartrate 25 MG tablet  Commonly known as:  LOPRESSOR  Take 0.5 tablets (12.5 mg total) by mouth 2 (two) times daily.     montelukast 10 MG tablet  Commonly known as:  SINGULAIR  Take 10 mg by mouth at bedtime.     nitroGLYCERIN 0.4 MG SL tablet  Commonly known as:  NITROSTAT  Place 0.4 mg under the tongue every 5 (five) minutes as needed for chest pain.     nystatin 100000 UNIT/ML suspension  Commonly known as:  MYCOSTATIN  Take 5 mLs (500,000 Units total) by mouth 4 (four) times daily. X 13 more days     pantoprazole 40 MG tablet  Commonly known as:  PROTONIX  Take 1 tablet (40 mg total) by mouth 2 (two) times daily before a meal.     polyethylene glycol packet  Commonly known as:  MIRALAX / GLYCOLAX  Take 17 g by mouth daily as needed.      senna-docusate 8.6-50 MG tablet  Commonly known as:  Senokot-S  Take 2 tablets by mouth daily.     tetrahydrozoline 0.05 % ophthalmic solution  Place 1 drop into both eyes daily as needed.     traMADol 50 MG tablet  Commonly known as:  ULTRAM  Take 1 tablet (50 mg total) by mouth every 8 (eight) hours as needed for moderate pain or severe pain.     venlafaxine 75 MG tablet  Commonly known as:  EFFEXOR  Take 75 mg by mouth 2 (two) times daily.        Filed Vitals:   01/04/16 0357 01/04/16 1206  BP: 167/55 137/53  Pulse: 73 75  Temp: 97.6 F (36.4 C) 97.9 F (36.6 C)  Resp: 20 18    Skin clean, dry and intact without evidence of skin break down, no evidence of skin tears noted. IV catheter discontinued intact. Site without signs and symptoms of complications. Dressing and pressure applied. Pt denies pain at this time. No complaints noted.  An After Visit Summary was printed and given to the patient. Patient escorted via EMS and D/C to SNF  [name]

## 2016-01-04 NOTE — Discharge Summary (Signed)
[facility] [place] NAME: Jill York    MR#:  [id number]  DATE OF BIRTH:  1930-11-02  DATE OF ADMISSION:  12/25/2015 ADMITTING PHYSICIAN: Harrel Lemon, MD  DATE OF DISCHARGE: 01/04/16  PRIMARY CARE PHYSICIAN: Sharyne Peach, MD    ADMISSION DIAGNOSIS:  Acute gastrointestinal bleeding [K92.2] Symptomatic anemia [D64.9]  DISCHARGE DIAGNOSIS:  Active Problems:   Acute blood loss anemia   SECONDARY DIAGNOSIS:   Past Medical History  Diagnosis Date  . CHF (congestive heart failure) ([place])   . Thyroid disease   . Diabetes mellitus without complication ([place])   . Hypertension   . Hyperlipidemia   . Arrhythmia     PVC  . COPD (chronic obstructive pulmonary disease) ([place])   . Chronic kidney disease   . OSA (obstructive sleep apnea) 2011  . Anemia   . Anxiety and depression     [name]  . Lymphoma ([place])   . Osteoarthritis   . Lymphedema     HOSPITAL COURSE:   82 year old female with past medical history significant for diabetes, hypertension, congestive heart failure, A. fib on eliquis, sleep apnea had a recent hospitalization 2 weeks ago for a fall left ankle fracture and noted to be anemic requiring transfusion. Readmitted for weakness and noted to be anemic again.  #1 acute on chronic anemia-secondary to GI bleed. Appreciate GI consult. -Received 4 units transfusion this admission. -Patient underwent repeat endoscopy showing duodenal polyp, gastritis and monilial esophagitis. -Aspirin and eliquis are on hold currently. Continue to hold for 2 weeks. -Hemoglobin is stable and keep it around 8 - on protonix and also oral nystatin  #2 left ankle fracture-recent fall and left lateral malleolus comminuted fracture. Continue ankle-foot orthosis -Orthopedics to follow-up in the next 2-3 days for repeat x-rays. -Physical therapy with weightbearing as tolerated  #3 atrial fibrillation-rate  controlled. On metoprolol. -Eliquis on hold due to GI bleed at this time.  #4 acute on chronic heart failure-has both systolic and diastolic heart failure. Last echocardiogram from 2016 showing EF of 45-50%. -Received Lasix for pulmonary congestion in the hospital. -Continue Lasix at discharge. Also on metoprolol, losartan. -Check potassium as outpatient next week. Follow up with cardiology as prior scheduled  #5 CK D stage III-stable.  Patient will be discharged back to rehabilitation  DISCHARGE CONDITIONS:   Guarded  CONSULTS OBTAINED:  Treatment Team:  Lollie Sails, MD  DRUG ALLERGIES:   Allergies  Allergen Reactions  . Amlodipine Swelling    Leg swelling only   . Oxycodone Other (See Comments)    Reaction: patient states she was "out" for 2 hours and did not notice people in the area    . Demerol [Meperidine] Diarrhea and Nausea And Vomiting  . Lisinopril Cough  . Tape Rash    DISCHARGE MEDICATIONS:   Current Discharge Medication List    START taking these medications   Details  nystatin (MYCOSTATIN) 100000 UNIT/ML suspension Take 5 mLs (500,000 Units total) by mouth 4 (four) times daily. X 13 more days Qty: 60 mL, Refills: 0    pantoprazole (PROTONIX) 40 MG tablet Take 1 tablet (40 mg total) by mouth 2 (two) times daily before a meal. Qty: 60 tablet, Refills: 0    traMADol (ULTRAM) 50 MG tablet Take 1 tablet (50 mg total) by mouth every 8 (eight) hours as needed for moderate pain or severe pain. Qty: 20 tablet, Refills: 0  CONTINUE these medications which have NOT CHANGED   Details  acetaminophen (TYLENOL) 500 MG tablet Take 1,000 mg by mouth every 6 (six) hours as needed.    albuterol (PROVENTIL HFA;VENTOLIN HFA) 108 (90 BASE) MCG/ACT inhaler Inhale 1 puff into the lungs every 4 (four) hours as needed for wheezing or shortness of breath.     albuterol (PROVENTIL) (2.5 MG/3ML) 0.083% nebulizer solution Take 2.5 mg by nebulization every 6 (six)  hours as needed for wheezing or shortness of breath.    amLODipine (NORVASC) 5 MG tablet Take 5 mg by mouth daily.    atorvastatin (LIPITOR) 80 MG tablet Take 80 mg by mouth daily.    baclofen (LIORESAL) 10 MG tablet Take 5 mg by mouth at bedtime as needed for muscle spasms.    bisacodyl (DULCOLAX) 5 MG EC tablet Take 5 mg by mouth daily as needed for moderate constipation.    calcitRIOL (ROCALTROL) 0.25 MCG capsule Take 0.25 mcg by mouth every Monday, Wednesday, and Friday.    Cholecalciferol 1000 UNITS tablet Take 1,000 Units by mouth daily.    docusate sodium (COLACE) 100 MG capsule Take 100 mg by mouth daily as needed for mild constipation or moderate constipation.     esomeprazole (NEXIUM) 20 MG capsule Take 20 mg by mouth daily as needed (for acid reflux).    fluticasone (FLONASE) 50 MCG/ACT nasal spray Place 1 spray into both nostrils daily.    Fluticasone-Salmeterol (ADVAIR) 250-50 MCG/DOSE AEPB Inhale 1 puff into the lungs 2 (two) times daily.    furosemide (LASIX) 40 MG tablet Take 40 mg by mouth daily.    gabapentin (NEURONTIN) 300 MG capsule Take 300 mg by mouth 2 (two) times daily.    insulin glargine (LANTUS) 100 UNIT/ML injection Inject 54 Units into the skin at bedtime.     insulin lispro (HUMALOG) 100 UNIT/ML injection Inject 20-26 Units into the skin 3 (three) times daily with meals. Inject 20 units sub-q in morning, inject 26 units sub-q in afternoon, and inject 20 units sub-q in evening    latanoprost (XALATAN) 0.005 % ophthalmic solution Place 1 drop into both eyes at bedtime.    levothyroxine (SYNTHROID, LEVOTHROID) 137 MCG tablet Take 137-274 mcg by mouth daily before breakfast. Take 113mcg (1 tablet) daily on Mon, Tues, Wed, Thurs, Sat, and Sun. Take 212mcg (2 tabs) daily on Friday. (Take with a glass of water at least 30 to 60 minutes before breakfast)    losartan (COZAAR) 25 MG tablet Take 25 mg by mouth daily.    metoprolol tartrate (LOPRESSOR) 25 MG  tablet Take 0.5 tablets (12.5 mg total) by mouth 2 (two) times daily. Qty: 30 tablet, Refills: 0    montelukast (SINGULAIR) 10 MG tablet Take 10 mg by mouth at bedtime.    nitroGLYCERIN (NITROSTAT) 0.4 MG SL tablet Place 0.4 mg under the tongue every 5 (five) minutes as needed for chest pain.    polyethylene glycol (MIRALAX / GLYCOLAX) packet Take 17 g by mouth daily as needed.    senna-docusate (SENOKOT-S) 8.6-50 MG tablet Take 2 tablets by mouth daily.     tetrahydrozoline 0.05 % ophthalmic solution Place 1 drop into both eyes daily as needed.    venlafaxine (EFFEXOR) 75 MG tablet Take 75 mg by mouth 2 (two) times daily.      STOP taking these medications     apixaban (ELIQUIS) 2.5 MG TABS tablet          DISCHARGE INSTRUCTIONS:   1. Weight bearing  as tolerated for the left leg, use walker, Physical therapy 2. Ortho f/u in 2-3 days 3. GI f/u in 2 weeks 4. PCP f/u in 1-2 weeks 5. BMP check in 3 days for potassium levels and CBC check for hemoglobin levels in 1 week.  If you experience worsening of your admission symptoms, develop shortness of breath, life threatening emergency, suicidal or homicidal thoughts you must seek medical attention immediately by calling 911 or calling your MD immediately  if symptoms less severe.  You Must read complete instructions/literature along with all the possible adverse reactions/side effects for all the Medicines you take and that have been prescribed to you. Take any new Medicines after you have completely understood and accept all the possible adverse reactions/side effects.   Please note  You were cared for by a hospitalist during your hospital stay. If you have any questions about your discharge medications or the care you received while you were in the hospital after you are discharged, you can call the unit and asked to speak with the hospitalist on call if the hospitalist that took care of you is not available. Once you are discharged,  your primary care physician will handle any further medical issues. Please note that NO REFILLS for any discharge medications will be authorized once you are discharged, as it is imperative that you return to your primary care physician (or establish a relationship with a primary care physician if you do not have one) for your aftercare needs so that they can reassess your need for medications and monitor your lab values.    Today   CHIEF COMPLAINT:   Chief Complaint  Patient presents with  . Abnormal Lab    VITAL SIGNS:  Blood pressure 167/55, pulse 73, temperature 97.6 F (36.4 C), temperature source Oral, resp. rate 20, height 5\' 4"  (1.626 m), weight 116.62 kg (257 lb 1.6 oz), SpO2 97 %.  I/O:   Intake/Output Summary (Last 24 hours) at 01/04/16 0932 Last data filed at 01/04/16 0441  Gross per 24 hour  Intake    683 ml  Output    800 ml  Net   -117 ml    PHYSICAL EXAMINATION:   Physical Exam  GENERAL:  82 y.o.-year-old patient sitting in the chair with no acute distress.  EYES: Pupils equal, round, reactive to light and accommodation. No scleral icterus. Extraocular muscles intact.  HEENT: Head atraumatic, normocephalic. Oropharynx and nasopharynx clear.  NECK:  Supple, no jugular venous distention. No thyroid enlargement, no tenderness.  LUNGS: Normal breath sounds bilaterally, no wheezing, rhonchi or crepitation. Fine bibasilar crackles. No use of accessory muscles of respiration.  CARDIOVASCULAR: S1, S2 normal. Irregular. No murmurs, rubs, or gallops.  ABDOMEN: Soft, non-tender, non-distended. Bowel sounds present. No organomegaly or mass.  EXTREMITIES: No cyanosis, or clubbing. No edema, left ankle and lower leg in a soft splint. NEUROLOGIC: Cranial nerves II through XII are intact. Muscle strength 5/5 in upper extremities adn 4/5 in lower- global weakness. Sensation intact. Gait not checked.  PSYCHIATRIC: The patient is alert and oriented x 3.  SKIN: No obvious rash,  lesion, or ulcer.   DATA REVIEW:   CBC  Recent Labs Lab 01/03/16 0759  WBC 6.4  HGB 8.9*  HCT 26.7*  PLT 142*    Chemistries   Recent Labs Lab 01/03/16 0759  NA 144  K 3.4*  CL 107  CO2 33*  GLUCOSE 203*  BUN 34*  CREATININE 1.39*  CALCIUM 8.6*    Cardiac  Enzymes No results for input(s): TROPONINI in the last 168 hours.  Microbiology Results  Results for orders placed or performed during the hospital encounter of 01/26/15  Urine culture     Status: None   Collection Time: 01/26/15  9:22 AM  Result Value Ref Range Status   Specimen Description URINE, RANDOM  Final   Special Requests NONE  Final   Culture   Final    >=100,000 COLONIES/mL KLEBSIELLA PNEUMONIAE >=100,000 COLONIES/mL ESCHERICHIA COLI    Report Status 01/29/2015 FINAL  Final   Organism ID, Bacteria KLEBSIELLA PNEUMONIAE  Final   Organism ID, Bacteria ESCHERICHIA COLI  Final      Susceptibility   Escherichia coli - MIC*    AMPICILLIN <=2 SENSITIVE Sensitive     CEFTAZIDIME <=1 SENSITIVE Sensitive     CEFAZOLIN <=4 SENSITIVE Sensitive     CEFTRIAXONE <=1 SENSITIVE Sensitive     CIPROFLOXACIN <=0.25 SENSITIVE Sensitive     GENTAMICIN <=1 SENSITIVE Sensitive     IMIPENEM <=0.25 SENSITIVE Sensitive     TRIMETH/SULFA <=20 SENSITIVE Sensitive     NITROFURANTOIN Value in next row Sensitive      SENSITIVE<=16    PIP/TAZO Value in next row Sensitive      SENSITIVE<=4    * >=100,000 COLONIES/mL ESCHERICHIA COLI   Klebsiella pneumoniae - MIC*    AMPICILLIN Value in next row Resistant      SENSITIVE<=4    CEFTAZIDIME Value in next row Sensitive      SENSITIVE<=4    CEFAZOLIN Value in next row Sensitive      SENSITIVE<=4    CEFTRIAXONE Value in next row Sensitive      SENSITIVE<=4    CIPROFLOXACIN Value in next row Sensitive      SENSITIVE<=4    GENTAMICIN Value in next row Sensitive      SENSITIVE<=4    IMIPENEM Value in next row Sensitive      SENSITIVE<=4    TRIMETH/SULFA Value in next row  Sensitive      SENSITIVE<=4    NITROFURANTOIN Value in next row Sensitive      SENSITIVE32    PIP/TAZO Value in next row Sensitive      SENSITIVE<=4    * >=100,000 COLONIES/mL KLEBSIELLA PNEUMONIAE    RADIOLOGY:  No results found.  EKG:   Orders placed or performed during the hospital encounter of 12/15/15  . ED EKG  . ED EKG      Management plans discussed with the patient, family and they are in agreement.  CODE STATUS:     Code Status Orders        Start     Ordered   12/25/15 1952  Do not attempt resuscitation (DNR)   Continuous    Question Answer Comment  In the event of cardiac or respiratory ARREST Do not call a "code blue"   In the event of cardiac or respiratory ARREST Do not perform Intubation, CPR, defibrillation or ACLS   In the event of cardiac or respiratory ARREST Use medication by any route, position, wound care, and other measures to relive pain and suffering. May use oxygen, suction and manual treatment of airway obstruction as needed for comfort.      12/25/15 1951    Code Status History    Date Active Date Inactive Code Status Order ID Comments User Context   12/15/2015 11:59 AM 12/20/2015  8:33 PM DNR [id number]  Demetrios Loll, MD Inpatient   01/01/2015 10:29 AM 01/04/2015  1:43 PM DNR [id number]  Loletha Grayer, MD ED    Advance Directive Documentation        Most Recent Value   Type of Advance Directive  Out of facility DNR (pink MOST or yellow form)   Pre-existing out of facility DNR order (yellow form or pink MOST form)  Yellow form placed in chart (order not valid for inpatient use)   "MOST" Form in Place?        TOTAL TIME TAKING CARE OF THIS PATIENT: 37 minutes.    Gladstone Lighter M.D on 01/04/2016 at 9:32 AM  Between 7am to 6pm - Pager - [phone]  After 6pm go to [email or web address] - password [email or web address] [facility]  Office  [phone]  CC: Primary care physician; Sharyne Peach, MD

## 2016-01-07 ENCOUNTER — Encounter: Payer: Self-pay | Admitting: Gastroenterology

## 2016-01-08 DIAGNOSIS — I5042 Chronic combined systolic (congestive) and diastolic (congestive) heart failure: Secondary | ICD-10-CM | POA: Diagnosis not present

## 2016-01-08 DIAGNOSIS — I482 Chronic atrial fibrillation: Secondary | ICD-10-CM

## 2016-01-08 DIAGNOSIS — E1142 Type 2 diabetes mellitus with diabetic polyneuropathy: Secondary | ICD-10-CM

## 2016-01-08 DIAGNOSIS — S8290XA Unspecified fracture of unspecified lower leg, initial encounter for closed fracture: Secondary | ICD-10-CM

## 2016-01-08 DIAGNOSIS — K922 Gastrointestinal hemorrhage, unspecified: Secondary | ICD-10-CM | POA: Diagnosis not present

## 2016-01-12 ENCOUNTER — Encounter: Payer: Self-pay | Admitting: Emergency Medicine

## 2016-01-12 ENCOUNTER — Emergency Department: Payer: Medicare Other

## 2016-01-12 ENCOUNTER — Inpatient Hospital Stay
Admission: EM | Admit: 2016-01-12 | Discharge: 2016-01-14 | DRG: 091 | Disposition: A | Discharge status: Skilled Nursing Facility | Payer: Medicare Other | Attending: Internal Medicine | Admitting: Internal Medicine

## 2016-01-12 DIAGNOSIS — K922 Gastrointestinal hemorrhage, unspecified: Secondary | ICD-10-CM | POA: Diagnosis present

## 2016-01-12 DIAGNOSIS — I959 Hypotension, unspecified: Secondary | ICD-10-CM | POA: Diagnosis present

## 2016-01-12 DIAGNOSIS — Z79899 Other long term (current) drug therapy: Secondary | ICD-10-CM

## 2016-01-12 DIAGNOSIS — I482 Chronic atrial fibrillation: Secondary | ICD-10-CM | POA: Diagnosis present

## 2016-01-12 DIAGNOSIS — Z66 Do not resuscitate: Secondary | ICD-10-CM | POA: Diagnosis present

## 2016-01-12 DIAGNOSIS — N170 Acute kidney failure with tubular necrosis: Secondary | ICD-10-CM | POA: Diagnosis present

## 2016-01-12 DIAGNOSIS — Z800 Family history of malignant neoplasm of digestive organs: Secondary | ICD-10-CM | POA: Diagnosis not present

## 2016-01-12 DIAGNOSIS — G92 Toxic encephalopathy: Principal | ICD-10-CM | POA: Diagnosis present

## 2016-01-12 DIAGNOSIS — Z823 Family history of stroke: Secondary | ICD-10-CM

## 2016-01-12 DIAGNOSIS — I4891 Unspecified atrial fibrillation: Secondary | ICD-10-CM | POA: Diagnosis not present

## 2016-01-12 DIAGNOSIS — Z9889 Other specified postprocedural states: Secondary | ICD-10-CM | POA: Diagnosis not present

## 2016-01-12 DIAGNOSIS — I252 Old myocardial infarction: Secondary | ICD-10-CM

## 2016-01-12 DIAGNOSIS — Z955 Presence of coronary angioplasty implant and graft: Secondary | ICD-10-CM | POA: Diagnosis not present

## 2016-01-12 DIAGNOSIS — Z7901 Long term (current) use of anticoagulants: Secondary | ICD-10-CM

## 2016-01-12 DIAGNOSIS — N183 Chronic kidney disease, stage 3 (moderate): Secondary | ICD-10-CM | POA: Diagnosis present

## 2016-01-12 DIAGNOSIS — E039 Hypothyroidism, unspecified: Secondary | ICD-10-CM | POA: Diagnosis present

## 2016-01-12 DIAGNOSIS — Z885 Allergy status to narcotic agent status: Secondary | ICD-10-CM | POA: Diagnosis not present

## 2016-01-12 DIAGNOSIS — Z87891 Personal history of nicotine dependence: Secondary | ICD-10-CM | POA: Diagnosis not present

## 2016-01-12 DIAGNOSIS — G4733 Obstructive sleep apnea (adult) (pediatric): Secondary | ICD-10-CM | POA: Diagnosis present

## 2016-01-12 DIAGNOSIS — R7989 Other specified abnormal findings of blood chemistry: Secondary | ICD-10-CM | POA: Diagnosis present

## 2016-01-12 DIAGNOSIS — R0902 Hypoxemia: Secondary | ICD-10-CM | POA: Diagnosis present

## 2016-01-12 DIAGNOSIS — J449 Chronic obstructive pulmonary disease, unspecified: Secondary | ICD-10-CM | POA: Diagnosis present

## 2016-01-12 DIAGNOSIS — R4182 Altered mental status, unspecified: Secondary | ICD-10-CM

## 2016-01-12 DIAGNOSIS — Z8572 Personal history of non-Hodgkin lymphomas: Secondary | ICD-10-CM

## 2016-01-12 DIAGNOSIS — Z9049 Acquired absence of other specified parts of digestive tract: Secondary | ICD-10-CM

## 2016-01-12 DIAGNOSIS — Z794 Long term (current) use of insulin: Secondary | ICD-10-CM

## 2016-01-12 DIAGNOSIS — I5042 Chronic combined systolic (congestive) and diastolic (congestive) heart failure: Secondary | ICD-10-CM | POA: Diagnosis not present

## 2016-01-12 DIAGNOSIS — Z7951 Long term (current) use of inhaled steroids: Secondary | ICD-10-CM

## 2016-01-12 DIAGNOSIS — Z888 Allergy status to other drugs, medicaments and biological substances status: Secondary | ICD-10-CM | POA: Diagnosis not present

## 2016-01-12 DIAGNOSIS — G9340 Encephalopathy, unspecified: Secondary | ICD-10-CM | POA: Diagnosis present

## 2016-01-12 DIAGNOSIS — T404X5A Adverse effect of other synthetic narcotics, initial encounter: Secondary | ICD-10-CM | POA: Diagnosis present

## 2016-01-12 DIAGNOSIS — I130 Hypertensive heart and chronic kidney disease with heart failure and stage 1 through stage 4 chronic kidney disease, or unspecified chronic kidney disease: Secondary | ICD-10-CM | POA: Diagnosis present

## 2016-01-12 DIAGNOSIS — E1122 Type 2 diabetes mellitus with diabetic chronic kidney disease: Secondary | ICD-10-CM | POA: Diagnosis present

## 2016-01-12 DIAGNOSIS — Z90710 Acquired absence of both cervix and uterus: Secondary | ICD-10-CM | POA: Diagnosis not present

## 2016-01-12 DIAGNOSIS — I509 Heart failure, unspecified: Secondary | ICD-10-CM | POA: Diagnosis not present

## 2016-01-12 LAB — TROPONIN I
TROPONIN I: 0.1 ng/mL — AB (ref <0.03)
TROPONIN I: 0.18 ng/mL — AB (ref <0.03)
Troponin I: 0.14 ng/mL (ref <0.03)

## 2016-01-12 LAB — PROTIME-INR
INR: 1.2
Prothrombin Time: 15.4 seconds — ABNORMAL HIGH (ref 11.4–15.0)

## 2016-01-12 LAB — URINALYSIS COMPLETE WITH MICROSCOPIC (ARMC ONLY)
BACTERIA UA: NONE SEEN
BILIRUBIN URINE: NEGATIVE
Glucose, UA: NEGATIVE mg/dL
HGB URINE DIPSTICK: NEGATIVE
KETONES UR: NEGATIVE mg/dL
Leukocytes, UA: NEGATIVE
NITRITE: NEGATIVE
PH: 5 (ref 5.0–8.0)
PROTEIN: 30 mg/dL — AB
Specific Gravity, Urine: 1.014 (ref 1.005–1.030)

## 2016-01-12 LAB — COMPREHENSIVE METABOLIC PANEL
ALT: 52 U/L (ref 14–54)
AST: 65 U/L — AB (ref 15–41)
Albumin: 3.2 g/dL — ABNORMAL LOW (ref 3.5–5.0)
Alkaline Phosphatase: 136 U/L — ABNORMAL HIGH (ref 38–126)
Anion gap: 5 (ref 5–15)
BILIRUBIN TOTAL: 0.5 mg/dL (ref 0.3–1.2)
BUN: 51 mg/dL — AB (ref 6–20)
CALCIUM: 9.1 mg/dL (ref 8.9–10.3)
CO2: 29 mmol/L (ref 22–32)
CREATININE: 2.37 mg/dL — AB (ref 0.44–1.00)
Chloride: 108 mmol/L (ref 101–111)
GFR calc Af Amer: 21 mL/min — ABNORMAL LOW (ref >60)
GFR, EST NON AFRICAN AMERICAN: 18 mL/min — AB (ref >60)
Glucose, Bld: 93 mg/dL (ref 65–99)
POTASSIUM: 4.1 mmol/L (ref 3.5–5.1)
Sodium: 142 mmol/L (ref 135–145)
TOTAL PROTEIN: 6.9 g/dL (ref 6.5–8.1)

## 2016-01-12 LAB — BLOOD GAS, ARTERIAL
ACID-BASE EXCESS: 4 mmol/L — AB (ref 0.0–3.0)
Allens test (pass/fail): POSITIVE — AB
BICARBONATE: 30.8 meq/L — AB (ref 21.0–28.0)
FIO2: 0.28
O2 Saturation: 88.4 %
PH ART: 7.34 — AB (ref 7.350–7.450)
Patient temperature: 37
pCO2 arterial: 57 mmHg — ABNORMAL HIGH (ref 32.0–48.0)
pO2, Arterial: 59 mmHg — ABNORMAL LOW (ref 83.0–108.0)

## 2016-01-12 LAB — CBC WITH DIFFERENTIAL/PLATELET
BASOS ABS: 0 10*3/uL (ref 0–0.1)
BASOS PCT: 1 %
EOS ABS: 0 10*3/uL (ref 0–0.7)
EOS PCT: 1 %
HCT: 27.4 % — ABNORMAL LOW (ref 35.0–47.0)
Hemoglobin: 8.9 g/dL — ABNORMAL LOW (ref 12.0–16.0)
LYMPHS PCT: 12 %
Lymphs Abs: 0.8 10*3/uL — ABNORMAL LOW (ref 1.0–3.6)
MCH: 31.2 pg (ref 26.0–34.0)
MCHC: 32.6 g/dL (ref 32.0–36.0)
MCV: 95.7 fL (ref 80.0–100.0)
Monocytes Absolute: 0.3 10*3/uL (ref 0.2–0.9)
Monocytes Relative: 5 %
Neutro Abs: 5.1 10*3/uL (ref 1.4–6.5)
Neutrophils Relative %: 81 %
PLATELETS: 215 10*3/uL (ref 150–440)
RBC: 2.86 MIL/uL — AB (ref 3.80–5.20)
RDW: 19.5 % — ABNORMAL HIGH (ref 11.5–14.5)
WBC: 6.2 10*3/uL (ref 3.6–11.0)

## 2016-01-12 LAB — GLUCOSE, CAPILLARY: GLUCOSE-CAPILLARY: 195 mg/dL — AB (ref 65–99)

## 2016-01-12 LAB — MRSA PCR SCREENING: MRSA by PCR: NEGATIVE

## 2016-01-12 LAB — TSH
TSH: 5.115 u[IU]/mL — ABNORMAL HIGH (ref 0.350–4.500)
TSH: 4.147 u[IU]/mL (ref 0.350–4.500)

## 2016-01-12 MED ORDER — ASPIRIN 81 MG PO CHEW
324.0000 mg | CHEWABLE_TABLET | Freq: Once | ORAL | Status: AC
  Administered 2016-01-12: 324 mg via ORAL
  Filled 2016-01-12: qty 4

## 2016-01-12 MED ORDER — SODIUM CHLORIDE 0.9% FLUSH
3.0000 mL | Freq: Two times a day (BID) | INTRAVENOUS | Status: DC
  Administered 2016-01-13 – 2016-01-14 (×2): 3 mL via INTRAVENOUS

## 2016-01-12 MED ORDER — NYSTATIN 100000 UNIT/ML MT SUSP
5.0000 mL | Freq: Four times a day (QID) | OROMUCOSAL | Status: DC
  Administered 2016-01-13 – 2016-01-14 (×5): 500000 [IU] via ORAL
  Filled 2016-01-12 (×6): qty 5

## 2016-01-12 MED ORDER — NAPHAZOLINE-GLYCERIN 0.012-0.2 % OP SOLN
1.0000 [drp] | Freq: Four times a day (QID) | OPHTHALMIC | Status: DC | PRN
  Filled 2016-01-12: qty 15

## 2016-01-12 MED ORDER — ACETAMINOPHEN 325 MG PO TABS
650.0000 mg | ORAL_TABLET | Freq: Four times a day (QID) | ORAL | Status: DC | PRN
  Administered 2016-01-13: 650 mg via ORAL
  Filled 2016-01-12: qty 2

## 2016-01-12 MED ORDER — LEVOTHYROXINE SODIUM 137 MCG PO TABS
137.0000 ug | ORAL_TABLET | ORAL | Status: DC
  Administered 2016-01-13 – 2016-01-14 (×2): 137 ug via ORAL
  Filled 2016-01-12 (×2): qty 1

## 2016-01-12 MED ORDER — ALBUTEROL SULFATE (2.5 MG/3ML) 0.083% IN NEBU: 2.5000 mg | INHALATION_SOLUTION | RESPIRATORY_TRACT | Status: DC | PRN

## 2016-01-12 MED ORDER — ALBUTEROL SULFATE HFA 108 (90 BASE) MCG/ACT IN AERS: 1.0000 | INHALATION_SPRAY | RESPIRATORY_TRACT | Status: DC | PRN

## 2016-01-12 MED ORDER — ENOXAPARIN SODIUM 40 MG/0.4ML ~~LOC~~ SOLN
30.0000 mg | SUBCUTANEOUS | Status: DC
  Administered 2016-01-12 – 2016-01-13 (×2): 30 mg via SUBCUTANEOUS
  Filled 2016-01-12 (×2): qty 0.4

## 2016-01-12 MED ORDER — GABAPENTIN 300 MG PO CAPS
300.0000 mg | ORAL_CAPSULE | Freq: Two times a day (BID) | ORAL | Status: DC
  Administered 2016-01-12 – 2016-01-14 (×4): 300 mg via ORAL
  Filled 2016-01-12 (×4): qty 1

## 2016-01-12 MED ORDER — CETYLPYRIDINIUM CHLORIDE 0.05 % MT LIQD
7.0000 mL | Freq: Two times a day (BID) | OROMUCOSAL | Status: DC
  Administered 2016-01-12 – 2016-01-14 (×4): 7 mL via OROMUCOSAL

## 2016-01-12 MED ORDER — LATANOPROST 0.005 % OP SOLN
1.0000 [drp] | Freq: Every day | OPHTHALMIC | Status: DC
  Administered 2016-01-12 – 2016-01-13 (×2): 1 [drp] via OPHTHALMIC
  Filled 2016-01-12: qty 2.5

## 2016-01-12 MED ORDER — ONDANSETRON HCL 4 MG PO TABS
4.0000 mg | ORAL_TABLET | Freq: Four times a day (QID) | ORAL | Status: DC | PRN
  Administered 2016-01-13 (×2): 4 mg via ORAL
  Filled 2016-01-12 (×3): qty 1

## 2016-01-12 MED ORDER — LOSARTAN POTASSIUM 25 MG PO TABS
25.0000 mg | ORAL_TABLET | Freq: Every day | ORAL | Status: DC
  Administered 2016-01-13 – 2016-01-14 (×2): 25 mg via ORAL
  Filled 2016-01-12 (×2): qty 1

## 2016-01-12 MED ORDER — VENLAFAXINE HCL 37.5 MG PO TABS
75.0000 mg | ORAL_TABLET | Freq: Two times a day (BID) | ORAL | Status: DC
  Administered 2016-01-12 – 2016-01-14 (×4): 75 mg via ORAL
  Filled 2016-01-12 (×5): qty 2

## 2016-01-12 MED ORDER — SODIUM CHLORIDE 0.9 % IV BOLUS (SEPSIS)
500.0000 mL | Freq: Once | INTRAVENOUS | Status: AC
  Administered 2016-01-12: 500 mL via INTRAVENOUS

## 2016-01-12 MED ORDER — ACETAMINOPHEN 650 MG RE SUPP: 650.0000 mg | Freq: Four times a day (QID) | RECTAL | Status: DC | PRN

## 2016-01-12 MED ORDER — PANTOPRAZOLE SODIUM 40 MG PO TBEC
40.0000 mg | DELAYED_RELEASE_TABLET | Freq: Two times a day (BID) | ORAL | Status: DC
  Administered 2016-01-13 – 2016-01-14 (×3): 40 mg via ORAL
  Filled 2016-01-12 (×3): qty 1

## 2016-01-12 MED ORDER — LEVOTHYROXINE SODIUM 137 MCG PO TABS: 274.0000 ug | ORAL_TABLET | ORAL | Status: DC

## 2016-01-12 MED ORDER — ALBUTEROL SULFATE (2.5 MG/3ML) 0.083% IN NEBU: 2.5000 mg | INHALATION_SOLUTION | Freq: Four times a day (QID) | RESPIRATORY_TRACT | Status: DC | PRN

## 2016-01-12 MED ORDER — MONTELUKAST SODIUM 10 MG PO TABS
10.0000 mg | ORAL_TABLET | Freq: Every day | ORAL | Status: DC
  Administered 2016-01-12 – 2016-01-13 (×2): 10 mg via ORAL
  Filled 2016-01-12 (×2): qty 1

## 2016-01-12 MED ORDER — NITROGLYCERIN 0.4 MG SL SUBL: 0.4000 mg | SUBLINGUAL_TABLET | SUBLINGUAL | Status: DC | PRN

## 2016-01-12 MED ORDER — SODIUM CHLORIDE 0.9% FLUSH
3.0000 mL | Freq: Two times a day (BID) | INTRAVENOUS | Status: DC
  Administered 2016-01-12 – 2016-01-13 (×4): 3 mL via INTRAVENOUS

## 2016-01-12 MED ORDER — POLYETHYLENE GLYCOL 3350 17 G PO PACK
17.0000 g | PACK | Freq: Every day | ORAL | Status: DC | PRN
  Administered 2016-01-14: 17 g via ORAL
  Filled 2016-01-12: qty 1

## 2016-01-12 MED ORDER — FUROSEMIDE 10 MG/ML IJ SOLN
20.0000 mg | Freq: Two times a day (BID) | INTRAMUSCULAR | Status: DC
  Administered 2016-01-12 – 2016-01-13 (×2): 20 mg via INTRAVENOUS
  Filled 2016-01-12 (×2): qty 2

## 2016-01-12 MED ORDER — ONDANSETRON HCL 4 MG/2ML IJ SOLN
4.0000 mg | Freq: Four times a day (QID) | INTRAMUSCULAR | Status: DC | PRN
  Administered 2016-01-12 – 2016-01-14 (×3): 4 mg via INTRAVENOUS
  Filled 2016-01-12 (×3): qty 2

## 2016-01-12 MED ORDER — SODIUM CHLORIDE 0.9 % IV SOLN: 250.0000 mL | INTRAVENOUS | Status: DC | PRN

## 2016-01-12 MED ORDER — ATORVASTATIN CALCIUM 20 MG PO TABS
80.0000 mg | ORAL_TABLET | Freq: Every day | ORAL | Status: DC
  Administered 2016-01-13 – 2016-01-14 (×2): 80 mg via ORAL
  Filled 2016-01-12 (×2): qty 4

## 2016-01-12 MED ORDER — MOMETASONE FURO-FORMOTEROL FUM 200-5 MCG/ACT IN AERO
2.0000 | INHALATION_SPRAY | Freq: Two times a day (BID) | RESPIRATORY_TRACT | Status: DC
  Administered 2016-01-12 – 2016-01-14 (×4): 2 via RESPIRATORY_TRACT
  Filled 2016-01-12: qty 8.8

## 2016-01-12 MED ORDER — SODIUM CHLORIDE 0.9% FLUSH: 3.0000 mL | INTRAVENOUS | Status: DC | PRN

## 2016-01-12 MED ORDER — FLUTICASONE PROPIONATE 50 MCG/ACT NA SUSP
1.0000 | Freq: Every day | NASAL | Status: DC
  Administered 2016-01-13 – 2016-01-14 (×2): 1 via NASAL
  Filled 2016-01-12: qty 16

## 2016-01-12 NOTE — Care Management (Signed)
From [facility], CSW updated.

## 2016-01-12 NOTE — ED Provider Notes (Signed)
Time Seen: Approximately 1020  I have reviewed the triage notes  Chief Complaint: Altered Mental Status   History of Present Illness: Jill York is a 82 y.o. female who presents with increased confusion which seemed to be a new finding per the nursing home. The patient herself is unable to offer any significant history or review of systems. She does not appear to be in any discomfort. She is arousable though very drowsy. She denies any focal pain. She apparently started to become confused yesterday with no documented fever. Patient has significant history of congestive heart failure, diabetes, COPD with, chronic renal disease. She's had a previous myocardial infarction which occurred at the end of June.   Past Medical History  Diagnosis Date  . CHF (congestive heart failure) ([place])   . Thyroid disease   . Diabetes mellitus without complication ([place])   . Hypertension   . Hyperlipidemia   . Arrhythmia     PVC  . COPD (chronic obstructive pulmonary disease) ([place])   . Chronic kidney disease   . OSA (obstructive sleep apnea) 2011  . Anemia   . Anxiety and depression     [name]  . Lymphoma ([place])   . Osteoarthritis   . Lymphedema     Patient Active Problem List   Diagnosis Date Noted  . Episodes of formed visual hallucinations 12/18/2015  . NSTEMI (non-ST elevated myocardial infarction) ([place]) 12/15/2015  . Acute blood loss anemia   . Hematochezia   . Acute respiratory failure with hypoxia ([place]) 01/01/2015  . Chronic diastolic heart failure ([place]) 11/01/2014    Past Surgical History  Procedure Laterality Date  . Tonsillectomy and adenoidectomy    . Appendectomy    . Knee surgery Left   . Abdominal hysterectomy    . Cardiac catheterization    . Coronary angioplasty      x4  . Cardiac stents    . Esophagogastroduodenoscopy (egd) with propofol N/A 12/27/2015    Procedure: ESOPHAGOGASTRODUODENOSCOPY (EGD) WITH PROPOFOL;  Surgeon: Lollie Sails, MD;  Location: [facility] ENDOSCOPY;  Service: Endoscopy;  Laterality: N/A;  Late afternoon case  . Esophagogastroduodenoscopy (egd) with propofol N/A 01/03/2016    Procedure: ESOPHAGOGASTRODUODENOSCOPY (EGD) WITH PROPOFOL;  Surgeon: Lollie Sails, MD;  Location: [facility] ENDOSCOPY;  Service: Endoscopy;  Laterality: N/A;    Past Surgical History  Procedure Laterality Date  . Tonsillectomy and adenoidectomy    . Appendectomy    . Knee surgery Left   . Abdominal hysterectomy    . Cardiac catheterization    . Coronary angioplasty      x4  . Cardiac stents    . Esophagogastroduodenoscopy (egd) with propofol N/A 12/27/2015    Procedure: ESOPHAGOGASTRODUODENOSCOPY (EGD) WITH PROPOFOL;  Surgeon: Lollie Sails, MD;  Location: [facility] ENDOSCOPY;  Service: Endoscopy;  Laterality: N/A;  Late afternoon case  . Esophagogastroduodenoscopy (egd) with propofol N/A 01/03/2016    Procedure: ESOPHAGOGASTRODUODENOSCOPY (EGD) WITH PROPOFOL;  Surgeon: Lollie Sails, MD;  Location: [facility] ENDOSCOPY;  Service: Endoscopy;  Laterality: N/A;    Current Outpatient Rx  Name  Route  Sig  Dispense  Refill  . acetaminophen (TYLENOL) 500 MG tablet   Oral   Take 1,000 mg by mouth every 6 (six) hours as needed.         Marland Kitchen albuterol (PROVENTIL HFA;VENTOLIN HFA) 108 (90 BASE) MCG/ACT inhaler   Inhalation   Inhale 1 puff into the lungs every 4 (four) hours as needed  for wheezing or shortness of breath.          Marland Kitchen albuterol (PROVENTIL) (2.5 MG/3ML) 0.083% nebulizer solution   Nebulization   Take 2.5 mg by nebulization every 6 (six) hours as needed for wheezing or shortness of breath.         Marland Kitchen amLODipine (NORVASC) 5 MG tablet   Oral   Take 5 mg by mouth daily.         Marland Kitchen atorvastatin (LIPITOR) 80 MG tablet   Oral   Take 80 mg by mouth daily.         . baclofen (LIORESAL) 10 MG tablet   Oral   Take 5 mg by mouth at bedtime as needed for muscle spasms.         . bisacodyl (DULCOLAX) 5 MG EC  tablet   Oral   Take 5 mg by mouth daily as needed for moderate constipation.         . calcitRIOL (ROCALTROL) 0.25 MCG capsule   Oral   Take 0.25 mcg by mouth every Monday, Wednesday, and Friday.         . Cholecalciferol 1000 UNITS tablet   Oral   Take 1,000 Units by mouth daily.         Marland Kitchen docusate sodium (COLACE) 100 MG capsule   Oral   Take 100 mg by mouth daily as needed for mild constipation or moderate constipation.          Marland Kitchen esomeprazole (NEXIUM) 20 MG capsule   Oral   Take 20 mg by mouth daily as needed (for acid reflux).         . fluticasone (FLONASE) 50 MCG/ACT nasal spray   Each Nare   Place 1 spray into both nostrils daily.         . Fluticasone-Salmeterol (ADVAIR) 250-50 MCG/DOSE AEPB   Inhalation   Inhale 1 puff into the lungs 2 (two) times daily.         . furosemide (LASIX) 40 MG tablet   Oral   Take 40 mg by mouth daily.         Marland Kitchen gabapentin (NEURONTIN) 300 MG capsule   Oral   Take 300 mg by mouth 2 (two) times daily.         . insulin glargine (LANTUS) 100 UNIT/ML injection   Subcutaneous   Inject 54 Units into the skin at bedtime.          . insulin lispro (HUMALOG) 100 UNIT/ML injection   Subcutaneous   Inject 20-26 Units into the skin 3 (three) times daily with meals. Inject 20 units sub-q in morning, inject 26 units sub-q in afternoon, and inject 20 units sub-q in evening         . latanoprost (XALATAN) 0.005 % ophthalmic solution   Both Eyes   Place 1 drop into both eyes at bedtime.         Marland Kitchen levothyroxine (SYNTHROID, LEVOTHROID) 137 MCG tablet   Oral   Take 137-274 mcg by mouth daily before breakfast. Take 183mcg (1 tablet) daily on Mon, Tues, Wed, Thurs, Sat, and Sun. Take 210mcg (2 tabs) daily on Friday. (Take with a glass of water at least 30 to 60 minutes before breakfast)         . losartan (COZAAR) 25 MG tablet   Oral   Take 25 mg by mouth daily.         . metoprolol tartrate (LOPRESSOR) 25 MG  tablet  Oral   Take 0.5 tablets (12.5 mg total) by mouth 2 (two) times daily.   30 tablet   0   . montelukast (SINGULAIR) 10 MG tablet   Oral   Take 10 mg by mouth at bedtime.         . nitroGLYCERIN (NITROSTAT) 0.4 MG SL tablet   Sublingual   Place 0.4 mg under the tongue every 5 (five) minutes as needed for chest pain.         Marland Kitchen nystatin (MYCOSTATIN) 100000 UNIT/ML suspension   Oral   Take 5 mLs (500,000 Units total) by mouth 4 (four) times daily. X 13 more days   60 mL   0   . pantoprazole (PROTONIX) 40 MG tablet   Oral   Take 1 tablet (40 mg total) by mouth 2 (two) times daily before a meal.   60 tablet   0   . polyethylene glycol (MIRALAX / GLYCOLAX) packet   Oral   Take 17 g by mouth daily as needed.         . senna-docusate (SENOKOT-S) 8.6-50 MG tablet   Oral   Take 2 tablets by mouth daily.          Marland Kitchen tetrahydrozoline 0.05 % ophthalmic solution   Both Eyes   Place 1 drop into both eyes daily as needed.         . traMADol (ULTRAM) 50 MG tablet   Oral   Take 1 tablet (50 mg total) by mouth every 8 (eight) hours as needed for moderate pain or severe pain.   20 tablet   0   . venlafaxine (EFFEXOR) 75 MG tablet   Oral   Take 75 mg by mouth 2 (two) times daily.           Allergies:  Amlodipine; Oxycodone; Demerol; Lisinopril; and Tape  Family History: Family History  Problem Relation Age of Onset  . Aneurysm Mother   . Stroke Mother   . Liver cancer Father     Social History: Social History  Substance Use Topics  . Smoking status: Former Smoker -- 1.50 packs/day for 18 years    Quit date: 06/25/1975  . Smokeless tobacco: Never Used  . Alcohol Use: No     Review of Systems:   10 point review of systems was performed and was otherwise negative: Review of systems per nursing home and review of the past medical record Constitutional: No fever Eyes: No visual disturbances ENT: No sore throat, ear pain Cardiac: No chest  pain Respiratory: No shortness of breath, wheezing, or stridor Abdomen: No abdominal pain, no vomiting, No diarrhea Endocrine: No weight loss, No night sweats Extremities: No peripheral edema, cyanosis Skin: No rashes, easy bruising Neurologic: No focal weakness, trouble with speech or swollowing Urologic: No dysuria, Hematuria, or urinary frequency   Physical Exam:  ED Triage Vitals  Enc Vitals Group     BP 01/12/16 1000 131/61 mmHg     Pulse Rate 01/12/16 1000 84     Resp 01/12/16 1000 16     Temp 01/12/16 1001 98.7 F (37.1 C)     Temp Source 01/12/16 1001 Oral     SpO2 01/12/16 1000 100 %     Weight 01/12/16 1001 259 lb 0.7 oz (117.5 kg)     Height 01/12/16 1001 5\' 4"  (1.626 m)     Head Cir --      Peak Flow --      Pain Score --  Pain Loc --      Pain Edu? --      Excl. in [place]? --     General: Patient is lethargic though arousable.; GCS 14 Head: Normal cephalic , atraumatic Eyes: Pupils equal , round, reactive to light Nose/Throat: No nasal drainage, patent upper airway with dry mucous membranes Neck: Supple, Full range of motion, No anterior adenopathy or palpable thyroid masses Lungs: Clear to ascultation without wheezes , rhonchi, or rales. Diminished at the bases Heart: Regular rate, your regular without murmurs , gallops , or rubs Abdomen: Soft, non tender without rebound, guarding , or rigidity; bowel sounds positive and symmetric in all 4 quadrants. No organomegaly .        Extremities: 2 plus symmetric pulses. No edema, clubbing or cyanosis Babinski downgoing Neurologic: , Motor symmetric without deficits, sensory intact Skin: warm, dry, no rashes   Labs:   All laboratory work was reviewed including any pertinent negatives or positives listed below:  Labs Reviewed  COMPREHENSIVE METABOLIC PANEL - Abnormal; Notable for the following:    BUN 51 (*)    Creatinine, Ser 2.37 (*)    Albumin 3.2 (*)    AST 65 (*)    Alkaline Phosphatase 136 (*)    GFR  calc non Af Amer 18 (*)    GFR calc Af Amer 21 (*)    All other components within normal limits  CBC WITH DIFFERENTIAL/PLATELET - Abnormal; Notable for the following:    RBC 2.86 (*)    Hemoglobin 8.9 (*)    HCT 27.4 (*)    RDW 19.5 (*)    Lymphs Abs 0.8 (*)    All other components within normal limits  TROPONIN I - Abnormal; Notable for the following:    Troponin I 0.10 (*)    All other components within normal limits  TSH - Abnormal; Notable for the following:    TSH 5.115 (*)    All other components within normal limits  URINALYSIS COMPLETEWITH MICROSCOPIC ([facility] ONLY) - Abnormal; Notable for the following:    Color, Urine YELLOW (*)    APPearance CLEAR (*)    Protein, ur 30 (*)    Squamous Epithelial / LPF 0-5 (*)    All other components within normal limits  PROTIME-INR - Abnormal; Notable for the following:    Prothrombin Time 15.4 (*)    All other components within normal limits  Patient's anemia appears to be old. Renal insufficiency with an elevated BUN/creatinine is a new finding. Patient also has a new onset elevated troponin in comparison to her last troponin 0.05.  EKG:  ED ECG REPORT I, Daymon Larsen, the attending physician, personally viewed and interpreted this ECG.  Date: 01/12/2016 EKG Time: 0955 Rate: 86 Rhythm: Atrial fibrillation QRS Axis: normal Intervals: normal ST/T Wave abnormalities: normal Conduction Disturbances: none Narrative Interpretation: unremarkable Incomplete left bundle branch block No acute ischemic changes  Radiology  CT HEAD WO CONTRAST (Final result) Result time: 01/12/16 11:12:28   Final result by Rad Results In Interface (01/12/16 11:12:28)   Narrative:   CLINICAL DATA: Altered mental status. Confused for 2 days.  EXAM: CT HEAD WITHOUT CONTRAST  TECHNIQUE: Contiguous axial images were obtained from the base of the skull through the vertex without intravenous contrast.  COMPARISON: 01/01/2015  FINDINGS: There  is no evidence of mass effect, midline shift, or extra-axial fluid collections. There is no evidence of a space-occupying lesion or intracranial hemorrhage. There is no evidence of a cortical-based area  of acute infarction. There is generalized cerebral atrophy. There is periventricular white matter low attenuation likely secondary to microangiopathy.  The ventricles and sulci are appropriate for the patient's age. The basal cisterns are patent.  Visualized portions of the orbits are unremarkable. The mastoid sinuses are clear. Right ethmoid sinus mucosal thickening. Cerebrovascular atherosclerotic calcifications are noted.  The osseous structures are unremarkable.  IMPRESSION: 1. No acute intracranial pathology. 2. Chronic microvascular disease and cerebral atrophy.   Electronically Signed By: Kathreen Devoid On: 01/12/2016 11:12          DG Chest Port 1 View (Final result) Result time: 01/12/16 10:48:57   Final result by Rad Results In Interface (01/12/16 10:48:57)   Narrative:   CLINICAL DATA: Increasing confusion for 2 days.  EXAM: PORTABLE CHEST 1 VIEW  COMPARISON: None.  FINDINGS: Diffuse mild bilateral interstitial thickening. No pleural effusion or pneumothorax. No focal consolidation. Stable cardiomegaly. Thoracic aortic atherosclerosis. Moderate osteoarthritis of the right glenohumeral joint.  IMPRESSION: Findings concerning for mild CHF.  Aortic Atherosclerosis (ICD10-170.0)   Electronically Signed By: Kathreen Devoid On: 01/12/2016 10:48        I personally reviewed the radiologic studies    ED Course:  Patient's differential is extensive given her age and past medical history for altered mental status. She appears to have new onset atrial fibrillation no she's got rate control and we have not had to administer any antiarrhythmic medication at this point. Her troponin is elevated again and she has a history of previous myocardial  infarction. Patient was given aspirin here in emergency department. Her head CT does not show any significant findings and there is no obvious focal neurologic deficits on exam. Patient was given IV fluid bolus for renal insufficiency. She otherwise remains hemodynamically stable and is afebrile per rectal temp.   Assessment:  Altered mental status New-onset atrial fibrillation Renal insufficiency      Plan:] Inpatient management           Daymon Larsen, MD 01/12/16 1258

## 2016-01-12 NOTE — ED Notes (Signed)
Patient presents to the ED with increasing confusion x 2 days via [facility] from [facility].  Staff reports patient has been asking repetitive questions and not behaving normally.  Patient appears to be quietly talking to herself and to be confused.  Patient is oriented to self and place but has difficulty answering most questions.  Mucous membranes are dry. Patient has a bandage to her left ankle.  EMS reports history of left ankle fracture.  Patient reports burning with urination.  Patient wearing incontinence pad from the facility.

## 2016-01-12 NOTE — ED Notes (Signed)
Patient requesting that we call her daughter to let her know that the patient is here. LM for [name] to call us back.

## 2016-01-12 NOTE — Progress Notes (Signed)
Patient admitted to unit. Attempted to orient patient to room, call bell, and staff. Patient very confused and upset. Bed in lowest position. Fall safety plan reviewed with patient and daughter. Full assessment to Epic. Skin assessment verified with --Gildardo Pounds RN---. Telemetry box verification with tele clerk and Elmyra Ricks NT- Box#: --[id number]---. Will continue to monitor. Daughter at bedside briefly; stating this is not patient's baseline.

## 2016-01-12 NOTE — Progress Notes (Signed)
Dr. Posey Pronto notified of ABG results and second troponin (0.14). No new orders at this time.

## 2016-01-12 NOTE — H&P (Signed)
[facility] [place] NAME: Jill York    MR#:  [id number]  DATE OF BIRTH:  01/10/1931  DATE OF ADMISSION:  01/12/2016  PRIMARY CARE PHYSICIAN: Sharyne Peach, MD   REQUESTING/REFERRING PHYSICIAN: Maryellen Pile MD  CHIEF COMPLAINT:   Chief Complaint  Patient presents with  . Altered Mental Status    HISTORY OF PRESENT ILLNESS: Jill York  is a 82 y.o. 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 Patient is currently at the skilled nursing facility where she was noted to have confusion as well as hypotension, hypoxia. She is sent back to the ED for evaluation. Patient currently is confused and very sleepy able to open eyes but not able to give any meaningful review of systems. Patient was previously on [employer] that had was held after her recent GI bleed. In the ER she is noted to have elevated creatinine and hemoglobin is stable chest x-ray suggestive of acute congestive heart failure exasperation .    PAST MEDICAL HISTORY:   Past Medical History  Diagnosis Date  . CHF (congestive heart failure) ([place])   . Thyroid disease   . Diabetes mellitus without complication ([place])   . Hypertension   . Hyperlipidemia   . Arrhythmia     PVC  . COPD (chronic obstructive pulmonary disease) ([place])   . Chronic kidney disease   . OSA (obstructive sleep apnea) 2011  . Anemia   . Anxiety and depression     [name]  . Lymphoma ([facility])   . Osteoarthritis   . Lymphedema     PAST SURGICAL HISTORY: Past Surgical History  Procedure Laterality Date  . Tonsillectomy and adenoidectomy    . Appendectomy    . Knee surgery Left   . Abdominal hysterectomy    . Cardiac catheterization    . Coronary angioplasty      x4  . Cardiac stents    . Esophagogastroduodenoscopy (egd) with propofol N/A 12/27/2015     Procedure: ESOPHAGOGASTRODUODENOSCOPY (EGD) WITH PROPOFOL;  Surgeon: Lollie Sails, MD;  Location: [facility] ENDOSCOPY;  Service: Endoscopy;  Laterality: N/A;  Late afternoon case  . Esophagogastroduodenoscopy (egd) with propofol N/A 01/03/2016    Procedure: ESOPHAGOGASTRODUODENOSCOPY (EGD) WITH PROPOFOL;  Surgeon: Lollie Sails, MD;  Location: [facility] ENDOSCOPY;  Service: Endoscopy;  Laterality: N/A;    SOCIAL HISTORY:  Social History  Substance Use Topics  . Smoking status: Former Smoker -- 1.50 packs/day for 18 years    Quit date: 06/25/1975  . Smokeless tobacco: Never Used  . Alcohol Use: No    FAMILY HISTORY:  Family History  Problem Relation Age of Onset  . Aneurysm Mother   . Stroke Mother   . Liver cancer Father     DRUG ALLERGIES:  Allergies  Allergen Reactions  . Amlodipine Swelling    Leg swelling only   . Oxycodone Other (See Comments)    Reaction: patient states she was "out" for 2 hours and did not notice people in the area    . Demerol [Meperidine] Diarrhea and Nausea And Vomiting  . Lisinopril Cough  . Tape Rash    REVIEW OF SYSTEMS:   CONSTITUTIONAL: Unable to do review of systems due to patient's mental status  MEDICATIONS AT HOME:  Prior to Admission medications   Medication Sig Start Date End Date  Taking? Authorizing Provider  acetaminophen (TYLENOL) 500 MG tablet Take 1,000 mg by mouth every 6 (six) hours as needed.   Yes Historical Provider, MD  albuterol (PROVENTIL HFA;VENTOLIN HFA) 108 (90 BASE) MCG/ACT inhaler Inhale 1 puff into the lungs every 4 (four) hours as needed for wheezing or shortness of breath.    Yes Historical Provider, MD  albuterol (PROVENTIL) (2.5 MG/3ML) 0.083% nebulizer solution Take 2.5 mg by nebulization every 6 (six) hours as needed for wheezing or shortness of breath.   Yes Historical Provider, MD  amLODipine (NORVASC) 5 MG tablet Take 5 mg by mouth daily.   Yes Historical Provider, MD  atorvastatin (LIPITOR) 80 MG tablet  Take 80 mg by mouth daily.   Yes Historical Provider, MD  baclofen (LIORESAL) 10 MG tablet Take 5 mg by mouth at bedtime as needed for muscle spasms.   Yes Historical Provider, MD  bisacodyl (DULCOLAX) 5 MG EC tablet Take 5 mg by mouth daily as needed for moderate constipation.   Yes Historical Provider, MD  calcitRIOL (ROCALTROL) 0.25 MCG capsule Take 0.25 mcg by mouth every Monday, Wednesday, and Friday.   Yes Historical Provider, MD  Cholecalciferol 1000 UNITS tablet Take 1,000 Units by mouth daily.   Yes Historical Provider, MD  docusate sodium (COLACE) 100 MG capsule Take 100 mg by mouth daily as needed for mild constipation or moderate constipation.    Yes Historical Provider, MD  esomeprazole (NEXIUM) 20 MG capsule Take 20 mg by mouth daily as needed (for acid reflux).   Yes Historical Provider, MD  fluticasone (FLONASE) 50 MCG/ACT nasal spray Place 1 spray into both nostrils daily.   Yes Historical Provider, MD  Fluticasone-Salmeterol (ADVAIR) 250-50 MCG/DOSE AEPB Inhale 1 puff into the lungs 2 (two) times daily.   Yes Historical Provider, MD  furosemide (LASIX) 40 MG tablet Take 40 mg by mouth daily.   Yes Historical Provider, MD  gabapentin (NEURONTIN) 300 MG capsule Take 300 mg by mouth 2 (two) times daily.   Yes Historical Provider, MD  insulin glargine (LANTUS) 100 UNIT/ML injection Inject 54 Units into the skin at bedtime.    Yes Historical Provider, MD  insulin lispro (HUMALOG) 100 UNIT/ML injection Inject 20-26 Units into the skin 3 (three) times daily with meals. Inject 20 units sub-q in morning, inject 26 units sub-q in afternoon, and inject 20 units sub-q in evening   Yes Historical Provider, MD  latanoprost (XALATAN) 0.005 % ophthalmic solution Place 1 drop into both eyes at bedtime.   Yes Historical Provider, MD  levothyroxine (SYNTHROID, LEVOTHROID) 137 MCG tablet Take 137-274 mcg by mouth daily before breakfast. Take 155mcg (1 tablet) daily on Mon, Tues, Wed, Thurs, Sat, and Sun.  Take 275mcg (2 tabs) daily on Friday. (Take with a glass of water at least 30 to 60 minutes before breakfast)   Yes Historical Provider, MD  losartan (COZAAR) 25 MG tablet Take 25 mg by mouth daily.   Yes Historical Provider, MD  metoprolol tartrate (LOPRESSOR) 25 MG tablet Take 0.5 tablets (12.5 mg total) by mouth 2 (two) times daily. 01/03/15  Yes Richard Leslye Peer, MD  montelukast (SINGULAIR) 10 MG tablet Take 10 mg by mouth at bedtime.   Yes Historical Provider, MD  nitroGLYCERIN (NITROSTAT) 0.4 MG SL tablet Place 0.4 mg under the tongue every 5 (five) minutes as needed for chest pain.   Yes Historical Provider, MD  nystatin (MYCOSTATIN) 100000 UNIT/ML suspension Take 5 mLs (500,000 Units total) by mouth 4 (four) times daily. X  13 more days 01/04/16  Yes Gladstone Lighter, MD  pantoprazole (PROTONIX) 40 MG tablet Take 1 tablet (40 mg total) by mouth 2 (two) times daily before a meal. 01/03/16  Yes Vipul Manuella Ghazi, MD  polyethylene glycol (MIRALAX / GLYCOLAX) packet Take 17 g by mouth daily as needed.   Yes Historical Provider, MD  senna-docusate (SENOKOT-S) 8.6-50 MG tablet Take 2 tablets by mouth daily.    Yes Historical Provider, MD  tetrahydrozoline 0.05 % ophthalmic solution Place 1 drop into both eyes daily as needed.   Yes Historical Provider, MD  traMADol (ULTRAM) 50 MG tablet Take 1 tablet (50 mg total) by mouth every 8 (eight) hours as needed for moderate pain or severe pain. 01/04/16  Yes Gladstone Lighter, MD  venlafaxine (EFFEXOR) 75 MG tablet Take 75 mg by mouth 2 (two) times daily.   Yes Historical Provider, MD      PHYSICAL EXAMINATION:   VITAL SIGNS: Blood pressure 135/60, pulse 68, temperature 98 F (36.7 C), temperature source Rectal, resp. rate 18, height 5\' 4"  (1.626 m), weight 117.5 kg (259 lb 0.7 oz), SpO2 96 %.  GENERAL:  82 y.o.-year-old patient lying in the bed Morbidly obese chronically ill-appearing.  EYES: Pupils equal, round, reactive to light and accommodation. No scleral  icterus. HEENT: Head atraumatic, normocephalic. Oropharynx and nasopharynx clear.  NECK:  Supple, no jugular venous distention. No thyroid enlargement, no tenderness.  LUNGS: Bilateral crackles at the bases, no wheezing, rales,rhonchi or crepitation. No use of accessory muscles of respiration.  CARDIOVASCULAR: S1, S2 normal. No murmurs, rubs, or gallops.  ABDOMEN: Soft, nontender, nondistended. Bowel sounds present. No organomegaly or mass.  EXTREMITIES: 2+ pedal edema, cyanosis, or clubbing.  NEUROLOGIC: Cranial nerves II through XII are intact. Muscle strength 5/5 in all extremities. Sensation intact. Gait not checked.  PSYCHIATRIC: The patient is drowsy  SKIN: No obvious rash, lesion, or ulcer.   LABORATORY PANEL:   CBC  Recent Labs Lab 01/12/16 0956  WBC 6.2  HGB 8.9*  HCT 27.4*  PLT 215  MCV 95.7  MCH 31.2  MCHC 32.6  RDW 19.5*  LYMPHSABS 0.8*  MONOABS 0.3  EOSABS 0.0  BASOSABS 0.0   ------------------------------------------------------------------------------------------------------------------  Chemistries   Recent Labs Lab 01/12/16 0956  NA 142  K 4.1  CL 108  CO2 29  GLUCOSE 93  BUN 51*  CREATININE 2.37*  CALCIUM 9.1  AST 65*  ALT 52  ALKPHOS 136*  BILITOT 0.5   ------------------------------------------------------------------------------------------------------------------ estimated creatinine clearance is 22.7 mL/min (by C-G formula based on Cr of 2.37). ------------------------------------------------------------------------------------------------------------------  Recent Labs  01/12/16 0956  TSH 5.115*     Coagulation profile  Recent Labs Lab 01/12/16 0956  INR 1.20   ------------------------------------------------------------------------------------------------------------------- No results for input(s): DDIMER in the last 72  hours. -------------------------------------------------------------------------------------------------------------------  Cardiac Enzymes  Recent Labs Lab 01/12/16 0956  TROPONINI 0.10*   ------------------------------------------------------------------------------------------------------------------ Invalid input(s): POCBNP  ---------------------------------------------------------------------------------------------------------------  Urinalysis    Component Value Date/Time   COLORURINE YELLOW* 01/12/2016 0956   COLORURINE Colorless 01/24/2014 1436   APPEARANCEUR CLEAR* 01/12/2016 0956   APPEARANCEUR Clear 01/24/2014 1436   LABSPEC 1.014 01/12/2016 0956   LABSPEC 1.006 01/24/2014 1436   PHURINE 5.0 01/12/2016 0956   PHURINE 5.0 01/24/2014 1436   GLUCOSEU NEGATIVE 01/12/2016 0956   GLUCOSEU 50 mg/dL 01/24/2014 1436   HGBUR NEGATIVE 01/12/2016 0956   HGBUR 1+ 01/24/2014 1436   BILIRUBINUR NEGATIVE 01/12/2016 0956   BILIRUBINUR Negative 01/24/2014 [place] 01/12/2016 0956   KETONESUR  Negative 01/24/2014 1436   PROTEINUR 30* 01/12/2016 0956   PROTEINUR 30 mg/dL 01/24/2014 1436   NITRITE NEGATIVE 01/12/2016 0956   NITRITE Negative 01/24/2014 1436   LEUKOCYTESUR NEGATIVE 01/12/2016 0956   LEUKOCYTESUR Negative 01/24/2014 1436     RADIOLOGY: Ct Head Wo Contrast  01/12/2016  CLINICAL DATA:  Altered mental status.  Confused for 2 days. EXAM: CT HEAD WITHOUT CONTRAST TECHNIQUE: Contiguous axial images were obtained from the base of the skull through the vertex without intravenous contrast. COMPARISON:  01/01/2015 FINDINGS: There is no evidence of mass effect, midline shift, or extra-axial fluid collections. There is no evidence of a space-occupying lesion or intracranial hemorrhage. There is no evidence of a cortical-based area of acute infarction. There is generalized cerebral atrophy. There is periventricular white matter low attenuation likely secondary to  microangiopathy. The ventricles and sulci are appropriate for the patient's age. The basal cisterns are patent. Visualized portions of the orbits are unremarkable. The mastoid sinuses are clear. Right ethmoid sinus mucosal thickening. Cerebrovascular atherosclerotic calcifications are noted. The osseous structures are unremarkable. IMPRESSION: 1. No acute intracranial pathology. 2. Chronic microvascular disease and cerebral atrophy. Electronically Signed   By: Kathreen Devoid   On: 01/12/2016 11:12   Dg Chest Port 1 View  01/12/2016  CLINICAL DATA:  Increasing confusion for 2 days. EXAM: PORTABLE CHEST 1 VIEW COMPARISON:  None. FINDINGS: Diffuse mild bilateral interstitial thickening. No pleural effusion or pneumothorax. No focal consolidation. Stable cardiomegaly. Thoracic aortic atherosclerosis. Moderate osteoarthritis of the right glenohumeral joint. IMPRESSION: Findings concerning for mild CHF. Aortic Atherosclerosis (ICD10-170.0) Electronically Signed   By: Kathreen Devoid   On: 01/12/2016 10:48    EKG: Orders placed or performed during the hospital encounter of 12/15/15  . ED EKG  . ED EKG    IMPRESSION AND PLAN: Patient is a 82 year old white female with history of congestive heart failure, hypothyroidism, diabetes, hyper lipidemia, COPD, chronic kidney disease, obstructive sleep apnea presenting with altered mental status, hypotension and hypoxia  1. Acute encephalopathy Suspect this is due to combination of hypotension and hypoxia CT scan is head negative No evidence of infection I will also check ABG make sure she is not retaining PCO2  2. Acute on chronic  Mixed systolic and diastolic chf Due to her hypotension at the facility I will give her low-dose IV Lasix currently her blood pressure here is stable  3. Chronic atrial fibrillation with recent GI bleed hold full dose anticoagulation  4. Hypotension we'll hold her antihypertensives will monitor blood pressure  5. Diabetes type 2  will place on sliding scale insulin and hold her insulin doses blood sugar was 93 on her BMP  6. Acute on chronic renal failure could be due to fluid overload will monitor renal function with IV Lasix  hold nephrotoxic agents  7. CODE STATUS DO NOT RESUSCITATE patient has the forms in place Prognosis poor recurrent admissions this is her third admission within the past few months I will last palliative care team to see to determine goal of care   All the records are reviewed and case discussed with ED provider. Management plans discussed with the patient, family and they are in agreement.  CODE STATUS:DNR    Code Status Orders        Start     Ordered   01/12/16 1300  Do not attempt resuscitation (DNR)   Continuous    Question Answer Comment  In the event of cardiac or respiratory ARREST Do not call  a "code blue"   In the event of cardiac or respiratory ARREST Do not perform Intubation, CPR, defibrillation or ACLS   In the event of cardiac or respiratory ARREST Use medication by any route, position, wound care, and other measures to relive pain and suffering. May use oxygen, suction and manual treatment of airway obstruction as needed for comfort.      01/12/16 1300    Code Status History    Date Active Date Inactive Code Status Order ID Comments User Context   12/25/2015  7:51 PM 01/04/2016  3:27 PM DNR [id number]  Baxter Hire, MD ED   12/15/2015 11:59 AM 12/20/2015  8:33 PM DNR [id number]  Demetrios Loll, MD Inpatient   01/01/2015 10:29 AM 01/04/2015  1:43 PM DNR [id number]  Loletha Grayer, MD ED    Advance Directive Documentation        Most Recent Value   Type of Advance Directive  Out of facility DNR (pink MOST or yellow form), Healthcare Power of Attorney   Pre-existing out of facility DNR order (yellow form or pink MOST form)     "MOST" Form in Place?         TOTAL TIME TAKING CARE OF THIS PATIENT: 55 minutes.    Dustin Flock M.D on 01/12/2016 at 1:03 PM  Between 7am to  6pm - Pager - [phone]  After 6pm go to [email or web address] - password [email or web address] [facility]  Office  [phone]  CC: Primary care physician; Sharyne Peach, MD

## 2016-01-13 LAB — BASIC METABOLIC PANEL
Anion gap: 9 (ref 5–15)
BUN: 50 mg/dL — ABNORMAL HIGH (ref 6–20)
CALCIUM: 8.8 mg/dL — AB (ref 8.9–10.3)
CO2: 26 mmol/L (ref 22–32)
CREATININE: 2.24 mg/dL — AB (ref 0.44–1.00)
Chloride: 108 mmol/L (ref 101–111)
GFR calc non Af Amer: 19 mL/min — ABNORMAL LOW (ref >60)
GFR, EST AFRICAN AMERICAN: 22 mL/min — AB (ref >60)
Glucose, Bld: 77 mg/dL (ref 65–99)
Potassium: 4.4 mmol/L (ref 3.5–5.1)
SODIUM: 143 mmol/L (ref 135–145)

## 2016-01-13 LAB — CBC
HCT: 27.3 % — ABNORMAL LOW (ref 35.0–47.0)
Hemoglobin: 8.8 g/dL — ABNORMAL LOW (ref 12.0–16.0)
MCH: 30.4 pg (ref 26.0–34.0)
MCHC: 32.4 g/dL (ref 32.0–36.0)
MCV: 93.9 fL (ref 80.0–100.0)
Platelets: 214 10*3/uL (ref 150–440)
RBC: 2.91 MIL/uL — ABNORMAL LOW (ref 3.80–5.20)
RDW: 18.8 % — AB (ref 11.5–14.5)
WBC: 7 10*3/uL (ref 3.6–11.0)

## 2016-01-13 LAB — GLUCOSE, CAPILLARY
GLUCOSE-CAPILLARY: 115 mg/dL — AB (ref 65–99)
Glucose-Capillary: 118 mg/dL — ABNORMAL HIGH (ref 65–99)

## 2016-01-13 LAB — TROPONIN I: TROPONIN I: 0.14 ng/mL — AB (ref <0.03)

## 2016-01-13 MED ORDER — FUROSEMIDE 20 MG PO TABS
20.0000 mg | ORAL_TABLET | Freq: Two times a day (BID) | ORAL | Status: DC
  Administered 2016-01-13 – 2016-01-14 (×2): 20 mg via ORAL
  Filled 2016-01-13 (×2): qty 1

## 2016-01-13 MED ORDER — INSULIN ASPART 100 UNIT/ML ~~LOC~~ SOLN
0.0000 [IU] | Freq: Three times a day (TID) | SUBCUTANEOUS | Status: DC
  Administered 2016-01-14: 2 [IU] via SUBCUTANEOUS
  Administered 2016-01-14: 1 [IU] via SUBCUTANEOUS
  Filled 2016-01-13: qty 1
  Filled 2016-01-13: qty 2

## 2016-01-13 NOTE — Progress Notes (Signed)
[place] [facility] [place] NAME: Jill York    MR#:  [id number]  DATE OF BIRTH:  1930-12-02  SUBJECTIVE: Admitted for altered mental status. Daughter thinks that patient got tram[facility]ol at [facility] after that she was confuse.d.. Today she is very alert and talkative. No shortness of breath or chest pain. Hemoglobin stable. Has some nausea but denies any other complaints.   CHIEF COMPLAINT:   Chief Complaint  Patient presents with  . Altered Mental Status    REVIEW OF SYSTEMS:   ROS CONSTITUTIONAL: No fever, fatigue or weakness.  EYES: No blurred or double vision.  EARS, NOSE, AND THROAT: No tinnitus or ear pain.  RESPIRATORY: No cough, shortness of breath, wheezing or hemoptysis.  CARDIOVASCULAR: No chest pain, orthopnea, edema.  GASTROINTESTINAL: No nausea, vomiting, diarrhea or abdominal pain.  GENITOURINARY: No dysuria, hematuria.  ENDOCRINE: No polyuria, nocturia,  HEMATOLOGY: No anemia, easy bruising or bleeding SKIN: No rash or lesion. MUSCULOSKELETAL: No joint pain or arthritis.   NEUROLOGIC: No tingling, numbness, weakness.  PSYCHIATRY: No anxiety or depression.   DRUG ALLERGIES:   Allergies  Allergen Reactions  . Amlodipine Swelling    Leg swelling only   . Oxycodone Other (See Comments)    Reaction: patient states she was "out" for 2 hours and did not notice people in the area    . Demerol [Meperidine] Diarrhea and Nausea And Vomiting  . Lisinopril Cough  . Tape Rash    VITALS:  Blood pressure 136/85, pulse 90, temperature 98.1 F (36.7 C), temperature source Oral, resp. rate 20, height 5\' 3"  (1.6 m), weight 116.892 kg (257 lb 11.2 oz), SpO2 96 %.  PHYSICAL EXAMINATION:  GENERAL:  82 y.o.-year-old patient lying in the bed with no acute distress.  EYES: Pupils equal, round, reactive to light and accommodation. No scleral icterus. Extraocular muscles intact.  HEENT: Head atraumatic, normocephalic. Oropharynx and  nasopharynx clear.  NECK:  Supple, no jugular venous distention. No thyroid enlargement, no tenderness.  LUNGS: Normal breath sounds bilaterally, no wheezing, rales,rhonchi or crepitation. No use of accessory muscles of respiration.  CARDIOVASCULAR: S1, S2 normal. No murmurs, rubs, or gallops.  ABDOMEN: Soft, nontender, nondistended. Bowel sounds present. No organomegaly or mass.  EXTREMITIES: No pedal edema, cyanosis, or clubbing.  NEUROLOGIC: Cranial nerves II through XII are intact. Muscle strength 5/5 in all extremities. Sensation intact. Gait not checked.  PSYCHIATRIC: The patient is alert and oriented x 3.  SKIN: No obvious rash, lesion, or ulcer.    LABORATORY PANEL:   CBC  Recent Labs Lab 01/13/16 0101  WBC 7.0  HGB 8.8*  HCT 27.3*  PLT 214   ------------------------------------------------------------------------------------------------------------------  Chemistries   Recent Labs Lab 01/12/16 0956 01/13/16 0101  NA 142 143  K 4.1 4.4  CL 108 108  CO2 29 26  GLUCOSE 93 77  BUN 51* 50*  CREATININE 2.37* 2.24*  CALCIUM 9.1 8.8*  AST 65*  --   ALT 52  --   ALKPHOS 136*  --   BILITOT 0.5  --    ------------------------------------------------------------------------------------------------------------------  Cardiac Enzymes  Recent Labs Lab 01/13/16 0101  TROPONINI 0.14*   ------------------------------------------------------------------------------------------------------------------  RADIOLOGY:  Ct Head Wo Contrast  01/12/2016  CLINICAL DATA:  Altered mental status.  Confused for 2 days. EXAM: CT HEAD WITHOUT CONTRAST TECHNIQUE: Contiguous axial images were obtained from the base of the skull through the vertex without intravenous contrast. COMPARISON:  01/01/2015 FINDINGS: There is no evidence of  mass effect, midline shift, or extra-axial fluid collections. There is no evidence of a space-occupying lesion or intracranial hemorrhage. There is no  evidence of a cortical-based area of acute infarction. There is generalized cerebral atrophy. There is periventricular white matter low attenuation likely secondary to microangiopathy. The ventricles and sulci are appropriate for the patient's age. The basal cisterns are patent. Visualized portions of the orbits are unremarkable. The mastoid sinuses are clear. Right ethmoid sinus mucosal thickening. Cerebrovascular atherosclerotic calcifications are noted. The osseous structures are unremarkable. IMPRESSION: 1. No acute intracranial pathology. 2. Chronic microvascular disease and cerebral atrophy. Electronically Signed   By: Kathreen Devoid   On: 01/12/2016 11:12   Dg Chest Port 1 View  01/12/2016  CLINICAL DATA:  Increasing confusion for 2 days. EXAM: PORTABLE CHEST 1 VIEW COMPARISON:  None. FINDINGS: Diffuse mild bilateral interstitial thickening. No pleural effusion or pneumothorax. No focal consolidation. Stable cardiomegaly. Thoracic aortic atherosclerosis. Moderate osteoarthritis of the right glenohumeral joint. IMPRESSION: Findings concerning for mild CHF. Aortic Atherosclerosis (ICD10-170.0) Electronically Signed   By: Kathreen Devoid   On: 01/12/2016 10:48    EKG:   Orders placed or performed during the hospital encounter of 12/15/15  . ED EKG  . ED EKG    ASSESSMENT AND PLAN:  1. Admit to status with metabolic encephalopathy likely secondary to tramadol. Improved mental status. Patient's daughter is concerned that patient gets tramadol at  [facility], at night for her ankle pain. need to  Avoid tramadol. #2 acute on chronic renal failure: Continue to monitor kidney function closely, DC IV Lasix. #3 .acute on chronic, combined heart failure: ; appears euvolemic:  discontinue IV Lasix. : We'll discharge tomorrow. Discussed with the patient's daughter.   All the records are reviewed and case discussed with Care Management/Social Workerr. Management plans discussed with the patient, family and  they are in agreement.  CODE STATUS: DNR  TOTAL TIME TAKING CARE OF THIS PATIENT: 26minutes.   POSSIBLE D/C IN 1-2 DAYS, DEPENDING ON CLINICAL CONDITION.   Epifanio Lesches M.D on 01/13/2016 at 10:30 AM  Between 7am to 6pm - Pager - [phone]  After 6pm go to [email or web address] - password [email or web address] [facility]  Office  [phone]  CC: Primary care physician; Sharyne Peach, MD   Note: This dictation was prepared with Dragon dictation along with smaller phrase technology. Any transcriptional errors that result from this process are unintentional.

## 2016-01-13 NOTE — Progress Notes (Signed)
C/o nausea following lunch.  Zofran 4 mg po given

## 2016-01-13 NOTE — Progress Notes (Signed)
Patient is alert. Oriented to self and past.  Not oriented what what is happening to her now.  Forgetful of events in recent hour.  Denies pain.

## 2016-01-13 NOTE — Care Management Note (Signed)
Case Management Note  Patient Details  Name: Jill York MRN: [id number] Date of Birth: 1931/06/23  Subjective/Objective:                 Spoke with patient and daughter in the room. Patient is currently at [facility] for rehab s/p ankle fracture. This is patient's second hospital stay during her rehab. Per her daughter she was at [facility] 4 days then came in for low Hgb and transfusions, then she stayed for a few more days and came back this time for AMS. It is the patient's and daughter's [facility]. The daughter stated that they are willing to pay out of pocket if the patient is not strong enough to return home after home medicare days are expired. The patient normally lives at home with private duty care givers for half a day three days a week. The patient has used [facility] in the past.    Action/Plan:  Anticipate DC to [facility] when medically clear for discharge.  Expected Discharge Date:                  Expected Discharge Plan:  [facility])  In-House Referral:  Clinical Social Work  Discharge planning Services  CM Consult  Post Acute Care Choice:  NA Choice offered to:  NA  DME Arranged:  N/A DME Agency:  NA  HH Arranged:  NA HH Agency:     Status of Service:  Completed, signed off  If discussed at [employer], dates discussed:    Additional Comments:  Carles Collet, RN 01/13/2016, 10:14 AM

## 2016-01-13 NOTE — Care Management Important Message (Signed)
Important Message  Patient Details  Name: AYRAM BELUE MRN: [id number] Date of Birth: August 08, 1930   Medicare Important Message Given:  Yes    Carles Collet, RN 01/13/2016, 12:11 PMImportant Message  Patient Details  Name: JAXIE SOKOLOWSKI MRN: [id number] Date of Birth: 03-23-1931   Medicare Important Message Given:  Yes    Carles Collet, RN 01/13/2016, 12:11 PM

## 2016-01-14 LAB — GLUCOSE, CAPILLARY
GLUCOSE-CAPILLARY: 142 mg/dL — AB (ref 65–99)
Glucose-Capillary: 161 mg/dL — ABNORMAL HIGH (ref 65–99)

## 2016-01-14 MED ORDER — LOSARTAN POTASSIUM 25 MG PO TABS: 12.5000 mg | ORAL_TABLET | Freq: Every day | ORAL | 0 refills | Status: DC

## 2016-01-14 MED ORDER — FUROSEMIDE 40 MG PO TABS: 20.0000 mg | ORAL_TABLET | Freq: Every day | ORAL | 0 refills | Status: DC

## 2016-01-14 MED ORDER — ONDANSETRON HCL 4 MG PO TABS: 4.0000 mg | ORAL_TABLET | Freq: Four times a day (QID) | ORAL | 0 refills | Status: DC | PRN

## 2016-01-14 NOTE — Progress Notes (Signed)
Clinical Social Worker was informed that patient will be medically ready to discharge to [facility]. Patient and her family are in in a agreement with plan. CSW called Seth Bake- Admissions Coordinator at [facility] to confirm that patient's bed is ready. Provided patient's room number 210 and number to call for report [phone] . All discharge information faxed to [facility] via [place].  Call to patient's daughter- [name], informed her patient would discharge to [facility]. RN will call report and patient will discharge to [facility] via EMS.  Ernest Pine, MSW, LCSW, [place] Clinical Social Worker [phone]

## 2016-01-14 NOTE — NC FL2 (Signed)
[place] LEVEL OF CARE SCREENING TOOL     IDENTIFICATION  Patient Name: Jill York Birthdate: [birth year] Sex: female Admission Date (Current Location): 01/12/2016  [place] and [place] Number:  Engineering geologist and Address:  [facility], [address], [place], [place] [address]      Provider Number: [id number]  Attending Physician Name and Address:  Epifanio Lesches, MD  Relative Name and Phone Number:       Current Level of Care: Hospital Recommended Level of Care: [place] Prior Approval Number:    Date Approved/Denied:   PASRR Number:  ([id number])  Discharge Plan: SNF    Current Diagnoses: Patient Active Problem List   Diagnosis Date Noted  . Acute encephalopathy 01/12/2016  . Episodes of formed visual hallucinations 12/18/2015  . NSTEMI (non-ST elevated myocardial infarction) ([place]) 12/15/2015  . Acute blood loss anemia   . Hematochezia   . Acute respiratory failure with hypoxia ([place]) 01/01/2015  . Chronic diastolic heart failure ([place]) 11/01/2014    Orientation RESPIRATION BLADDER Height & Weight     Self, Time, Place  O2 (Nasal Cannula 3 (L/min)) Continent Weight: 257 lb 11.2 oz (116.9 kg) Height:  5\' 3"  (160 cm)  BEHAVIORAL SYMPTOMS/MOOD NEUROLOGICAL BOWEL NUTRITION STATUS   (None)  (None) Continent Diet (DYS 1 )  AMBULATORY STATUS COMMUNICATION OF NEEDS Skin   Extensive Assist Verbally Normal                       Personal Care Assistance Level of Assistance  Bathing, Feeding, Dressing Bathing Assistance: Limited assistance Feeding assistance: Independent Dressing Assistance: Limited assistance     Functional Limitations Info  Sight, Hearing, Speech Sight Info: Adequate Hearing Info: Adequate Speech Info: Adequate    SPECIAL CARE FACTORS FREQUENCY  PT (By licensed PT), OT (By licensed OT)     PT Frequency:  (5) OT Frequency:  (5)            Contractures       Additional Factors Info  Code Status, Allergies, Insulin Sliding Scale Code Status Info:  (DNR) Allergies Info:  (Amlodipine, Oxycodone, Demerol, Lisinopril, Tape)   Insulin Sliding Scale Info:  (insulin glargine (LANTUS) 100 UNIT/ML injection Inject 50 Units into the skin at bedtime.)       Current Medications (01/14/2016):  This is the current hospital active medication list Current Facility-Administered Medications  Medication Dose Route Frequency Provider Last Rate Last Dose  . 0.9 %  sodium chloride infusion  250 mL Intravenous PRN Dustin Flock, MD      . acetaminophen (TYLENOL) tablet 650 mg  650 mg Oral Q6H PRN Dustin Flock, MD   650 mg at 01/13/16 [id number]   Or  . acetaminophen (TYLENOL) suppository 650 mg  650 mg Rectal Q6H PRN Dustin Flock, MD      . albuterol (PROVENTIL) (2.5 MG/3ML) 0.083% nebulizer solution 2.5 mg  2.5 mg Nebulization Q4H PRN Dustin Flock, MD      . antiseptic oral rinse (CPC / CETYLPYRIDINIUM CHLORIDE 0.05%) solution 7 mL  7 mL Mouth Rinse BID Dustin Flock, MD   7 mL at 01/13/16 2200  . atorvastatin (LIPITOR) tablet 80 mg  80 mg Oral Daily Dustin Flock, MD   80 mg at 01/13/16 0950  . enoxaparin (LOVENOX) injection 30 mg  30 mg Subcutaneous Q24H Dustin Flock, MD   30 mg at 01/13/16 2116  . fluticasone (FLONASE) 50 MCG/ACT nasal spray 1 spray  1 spray Each Nare Daily Dustin Flock, MD   1 spray at 01/13/16 0951  . furosemide (LASIX) tablet 20 mg  20 mg Oral BID Epifanio Lesches, MD   20 mg at 01/14/16 0809  . gabapentin (NEURONTIN) capsule 300 mg  300 mg Oral BID Dustin Flock, MD   300 mg at 01/13/16 2116  . insulin aspart (novoLOG) injection 0-9 Units  0-9 Units Subcutaneous TID WC Epifanio Lesches, MD   1 Units at 01/14/16 0809  . latanoprost (XALATAN) 0.005 % ophthalmic solution 1 drop  1 drop Both Eyes QHS Dustin Flock, MD   1 drop at 01/13/16 2118  . levothyroxine (SYNTHROID, LEVOTHROID) tablet 137 mcg  137 mcg Oral Once per day on  Sun Mon Tue Wed Thu Sat Dustin Flock, MD   137 mcg at 01/14/16 0809  . [START ON 01/19/2016] levothyroxine (SYNTHROID, LEVOTHROID) tablet 274 mcg  274 mcg Oral Once per day on Fri Dustin Flock, MD      . losartan (COZAAR) tablet 25 mg  25 mg Oral Daily Dustin Flock, MD   25 mg at 01/13/16 0950  . mometasone-formoterol (DULERA) 200-5 MCG/ACT inhaler 2 puff  2 puff Inhalation BID Dustin Flock, MD   2 puff at 01/13/16 2117  . montelukast (SINGULAIR) tablet 10 mg  10 mg Oral QHS Dustin Flock, MD   10 mg at 01/13/16 2116  . naphazoline-glycerin (CLEAR EYES) ophth solution 1 drop  1 drop Both Eyes QID PRN Dustin Flock, MD      . nitroGLYCERIN (NITROSTAT) SL tablet 0.4 mg  0.4 mg Sublingual Q5 min PRN Dustin Flock, MD      . nystatin (MYCOSTATIN) 100000 UNIT/ML suspension 500,000 Units  5 mL Oral QID Dustin Flock, MD   500,000 Units at 01/13/16 2117  . ondansetron (ZOFRAN) tablet 4 mg  4 mg Oral Q6H PRN Dustin Flock, MD   4 mg at 01/13/16 2116   Or  . ondansetron (ZOFRAN) injection 4 mg  4 mg Intravenous Q6H PRN Dustin Flock, MD   4 mg at 01/14/16 0701  . pantoprazole (PROTONIX) EC tablet 40 mg  40 mg Oral BID AC Dustin Flock, MD   40 mg at 01/14/16 0809  . polyethylene glycol (MIRALAX / GLYCOLAX) packet 17 g  17 g Oral Daily PRN Dustin Flock, MD      . sodium chloride flush (NS) 0.9 % injection 3 mL  3 mL Intravenous Q12H Dustin Flock, MD   3 mL at 01/13/16 1000  . sodium chloride flush (NS) 0.9 % injection 3 mL  3 mL Intravenous Q12H Dustin Flock, MD   3 mL at 01/13/16 2118  . sodium chloride flush (NS) 0.9 % injection 3 mL  3 mL Intravenous PRN Dustin Flock, MD      . venlafaxine [facility]) tablet 75 mg  75 mg Oral BID Dustin Flock, MD   75 mg at 01/13/16 2116     Discharge Medications: Please see discharge summary for a list of discharge medications.  Relevant Imaging Results:  Relevant Lab Results:   Additional Information  (SSN [id number])  Lorenso Quarry  [employer]cia [employer], LCSW

## 2016-01-14 NOTE — Discharge Summary (Addendum)
Jill York, is a 82 y.o. female  DOB 1931-03-25  MRN [id number].  Admission date:  01/12/2016  Admitting Physician  Dustin Flock, MD  Discharge Date:  01/14/2016   Primary MD  Sharyne Peach, MD  Recommendations for primary care physician for things to follow:   Follow up with PMD in 2 weeks   Admission Diagnosis  Altered Mental Status   Discharge Diagnosis  Altered Mental Status    Active Problems:   Acute encephalopathy      Past Medical History:  Diagnosis Date  . Anemia   . Anxiety and depression    [name]  . Arrhythmia    PVC  . CHF (congestive heart failure) ([place])   . Chronic kidney disease   . COPD (chronic obstructive pulmonary disease) ([place])   . Diabetes mellitus without complication ([place])   . Hyperlipidemia   . Hypertension   . Lymphedema   . Lymphoma ([place])   . OSA (obstructive sleep apnea) 2011  . Osteoarthritis   . Thyroid disease     Past Surgical History:  Procedure Laterality Date  . ABDOMINAL HYSTERECTOMY    . APPENDECTOMY    . CARDIAC CATHETERIZATION    . cardiac stents    . CORONARY ANGIOPLASTY     x4  . ESOPHAGOGASTRODUODENOSCOPY (EGD) WITH PROPOFOL N/A 12/27/2015   Procedure: ESOPHAGOGASTRODUODENOSCOPY (EGD) WITH PROPOFOL;  Surgeon: Lollie Sails, MD;  Location: [facility] ENDOSCOPY;  Service: Endoscopy;  Laterality: N/A;  Late afternoon case  . ESOPHAGOGASTRODUODENOSCOPY (EGD) WITH PROPOFOL N/A 01/03/2016   Procedure: ESOPHAGOGASTRODUODENOSCOPY (EGD) WITH PROPOFOL;  Surgeon: Lollie Sails, MD;  Location[facility] END[facility]COPY;  Service: Endoscopy;  Laterality: N/A;  . KNEE SURGERY Left   . TONSILLECTOMY AND ADENOIDECTOMY         History of present illness and  Hospital Course:     Kindly see H&P for history of present illness and admission  details, please review complete Labs, Consult reports and Test reports for all details in brief  HPI  from the history and physical done on the day of admission 82 year old female patient admitted for altered mental status. It developed after she took tramadol. Patient admitted to medical service because of altered mental status and possible CHF exacerbation.   Hospital Course  #1 altered mental status with metabolic encephalopathy secondary to tramadol: Mental status improved. According to the daughter patient gets confused after she uses tramadol. So patient should not get tramadol for pain instead get Tylenol for pain control.  I discontinued tramadol and discharge medications. #2 chronic combined of heart failure: Stable euvolemic. On IV Lasix and admission but discontinued.   #3. Chronic kidney disease stage III:  acute renal failure due to ATN; creatinine 2.24 yesterday, baseline 1.39. Patient needs repeat blood work in 3-4 days to make sure it improves. I decreased the Lasix to 20 mg daily, And 12..5 mg by mouth daily, she can continue same dose of metoprolol.  #4 chronic atrial fibrillation with recent episode of gastrointestinal bleeding: Patient received blood transfusion 4 units during last admission she was discharged on July 13 and she was advised to hold the blood thinners for 2 weeks. Patient should not take the blood thinner namely  ELiquis till 27th of July. Hemoglobin is stable on this admission at 8.9. Agent can continue beta blockers but not the blood thinners. She is at high risk for strokes but because ofl recent GI bleed this has been advised by GI.  #5 history of comminuted fracture of left malleolus: Patient is supposed to take Tylenol only. Do not give tramadol because it causes confusion for her.  Discharge Condition: stable.   Follow UP      Discharge Instructions  and  Discharge Medications     Discharge Instructions    Discharge instructions    Complete by:   As directed   Please do not give tramadol to patient at all,she gets confused,hypoxic ,   Discharge instructions    Complete by:  As directed       Medication List    STOP taking these medications   traMADol 50 MG tablet Commonly known as:  ULTRAM     TAKE these medications   acetaminophen 500 MG tablet Commonly known as:  TYLENOL Take 1,000 mg by mouth every 6 (six) hours as needed.   albuterol 108 (90 Base) MCG/ACT inhaler Commonly known as:  PROVENTIL HFA;VENTOLIN HFA Inhale 1 puff into the lungs every 4 (four) hours as needed for wheezing or shortness of breath.   albuterol (2.5 MG/3ML) 0.083% nebulizer solution Commonly known as:  PROVENTIL Take 2.5 mg by nebulization every 6 (six) hours as needed for wheezing or shortness of breath.   amLODipine 5 MG tablet Commonly known as:  NORVASC Take 5 mg by mouth daily.   atorvastatin 80 MG tablet Commonly known as:  LIPITOR Take 80 mg by mouth daily.   baclofen 10 MG tablet Commonly known as:  LIORESAL Take 5 mg by mouth at bedtime as needed for muscle spasms.   bisacodyl 5 MG EC tablet Commonly known as:  DULCOLAX Take 5 mg by mouth daily as needed for moderate constipation.   calcitRIOL 0.25 MCG capsule Commonly known as:  ROCALTROL Take 0.25 mcg by mouth every Monday, Wednesday, and Friday.   Cholecalciferol 1000 units tablet Take 1,000 Units by mouth daily.   docusate sodium 100 MG capsule Commonly known as:  COLACE Take 100 mg by mouth daily as needed for mild constipation or moderate constipation.   esomeprazole 20 MG capsule Commonly known as:  NEXIUM Take 20 mg by mouth daily as needed (for acid reflux).   fluticasone 50 MCG/ACT nasal spray Commonly known as:  FLONASE Place 1 spray into both nostrils daily.   Fluticasone-Salmeterol 250-50 MCG/DOSE Aepb Commonly known as:  ADVAIR Inhale 1 puff into the lungs 2 (two) times daily.   furosemide 40 MG tablet Commonly known as:  LASIX Take 0.5 tablets  (20 mg total) by mouth daily. What changed:  how  much to take   gabapentin 300 MG capsule Commonly known as:  NEURONTIN Take 300 mg by mouth 2 (two) times daily.   insulin glargine 100 UNIT/ML injection Commonly known as:  LANTUS Inject 54 Units into the skin at bedtime.   insulin lispro 100 UNIT/ML injection Commonly known as:  HUMALOG Inject 20-26 Units into the skin 3 (three) times daily with meals. Inject 20 units sub-q in morning, inject 26 units sub-q in afternoon, and inject 20 units sub-q in evening   latanoprost 0.005 % ophthalmic solution Commonly known as:  XALATAN Place 1 drop into both eyes at bedtime.   levothyroxine 137 MCG tablet Commonly known as:  SYNTHROID, LEVOTHROID Take 137-274 mcg by mouth daily before breakfast. Take 181mcg (1 tablet) daily on Mon, Tues, Wed, Thurs, Sat, and Sun. Take 249mcg (2 tabs) daily on Friday. (Take with a glass of water at least 30 to 60 minutes before breakfast) Notes to patient:  30 MIN BEFORE BREAKFAST   losartan 25 MG tablet Commonly known as:  COZAAR Take 0.5 tablets (12.5 mg total) by mouth daily. What changed:  how much to take   metoprolol tartrate 25 MG tablet Commonly known as:  LOPRESSOR Take 0.5 tablets (12.5 mg total) by mouth 2 (two) times daily.   montelukast 10 MG tablet Commonly known as:  SINGULAIR Take 10 mg by mouth at bedtime.   nitroGLYCERIN 0.4 MG SL tablet Commonly known as:  NITROSTAT Place 0.4 mg under the tongue every 5 (five) minutes as needed for chest pain.   nystatin 100000 UNIT/ML suspension Commonly known as:  MYCOSTATIN Take 5 mLs (500,000 Units total) by mouth 4 (four) times daily. X 13 more days   ondansetron 4 MG tablet Commonly known as:  ZOFRAN Take 1 tablet (4 mg total) by mouth every 6 (six) hours as needed for nausea.   pantoprazole 40 MG tablet Commonly known as:  PROTONIX Take 1 tablet (40 mg total) by mouth 2 (two) times daily before a meal.   polyethylene glycol  packet Commonly known as:  MIRALAX / GLYCOLAX Take 17 g by mouth daily as needed.   senna-docusate 8.6-50 MG tablet Commonly known as:  Senokot-S Take 2 tablets by mouth daily.   tetrahydrozoline 0.05 % ophthalmic solution Place 1 drop into both eyes daily as needed.   venlafaxine 75 MG tablet Commonly known as:  EFFEXOR Take 75 mg by mouth 2 (two) times daily.         Diet and Activity recommendation: See Discharge Instructions above   Consults obtained - none   Major procedures and Radiology Reports - PLEASE review detailed and final reports for all details, in brief -     Dg Chest 2 View  Result Date: 12/27/2015 CLINICAL DATA:  Chronic shortness of breath. EXAM: CHEST  2 VIEW COMPARISON:  01/01/2015 FINDINGS: AP and lateral views of the chest show enlargement of the cardiopericardial silhouette, stable. There is pulmonary vascular congestion without overt pulmonary edema although interstitial prominence raises the question of a degree of interstitial edema no focal airspace consolidation. No pleural effusion. Degenerative or chronic posttraumatic changes noted in the right shoulder. Telemetry leads overlie the chest. IMPRESSION: Cardiomegaly with vascular congestion and possible interstitial edema. Electronically Signed   By: Misty Stanley M.D.   On: 12/27/2015 13:42   Dg Shoulder Right  Result Date: 12/16/2015 CLINICAL DATA:  Fall, right shoulder pain EXAM: RIGHT SHOULDER - 2+ VIEW COMPARISON:  10/20/2014 FINDINGS: Three views of the right shoulder  submitted. There is old fracture deformity of the right humeral head without significant change from prior exam. Extensive degenerative changes AC joint again noted. Stable degenerative changes glenohumeral joint. Stable mild inferior spurring of glenoid. No acute fracture or subluxation. IMPRESSION: No acute fracture or or subluxation. Stable old fracture deformity of the right humeral head. Extensive osteoarthritic changes as  described above. Diffuse osteopenia. Electronically Signed   By: Lahoma Crocker M.D.   On: 12/16/2015 16:21   Dg Knee 1-2 Views Right  Result Date: 12/15/2015 CLINICAL DATA:  Fall.  Pain. EXAM: RIGHT KNEE - 1-2 VIEW COMPARISON:  None. FINDINGS: Tricompartment degenerative change. No evidence fracture dislocation. Loose bodies noted. Peripheral vascular calcification . IMPRESSION: 1. Severe tricompartment degenerative change. Degenerative changes most prominent about the medial and patellofemoral compartments. Loose bodies noted. 2. Peripheral vascular disease . Electronically Signed   By: Marcello Moores  Register   On: 12/15/2015 12:35   Ct Head Wo Contrast  Result Date: 01/12/2016 CLINICAL DATA:  Altered mental status.  Confused for 2 days. EXAM: CT HEAD WITHOUT CONTRAST TECHNIQUE: Contiguous axial images were obtained from the base of the skull through the vertex without intravenous contrast. COMPARISON:  01/01/2015 FINDINGS: There is no evidence of mass effect, midline shift, or extra-axial fluid collections. There is no evidence of a space-occupying lesion or intracranial hemorrhage. There is no evidence of a cortical-based area of acute infarction. There is generalized cerebral atrophy. There is periventricular white matter low attenuation likely secondary to microangiopathy. The ventricles and sulci are appropriate for the patient's age. The basal cisterns are patent. Visualized portions of the orbits are unremarkable. The mastoid sinuses are clear. Right ethmoid sinus mucosal thickening. Cerebrovascular atherosclerotic calcifications are noted. The osseous structures are unremarkable. IMPRESSION: 1. No acute intracranial pathology. 2. Chronic microvascular disease and cerebral atrophy. Electronically Signed   By: Kathreen Devoid   On: 01/12/2016 11:12   Dg Chest Port 1 View  Result Date: 01/12/2016 CLINICAL DATA:  Increasing confusion for 2 days. EXAM: PORTABLE CHEST 1 VIEW COMPARISON:  None. FINDINGS: Diffuse  mild bilateral interstitial thickening. No pleural effusion or pneumothorax. No focal consolidation. Stable cardiomegaly. Thoracic aortic atherosclerosis. Moderate osteoarthritis of the right glenohumeral joint. IMPRESSION: Findings concerning for mild CHF. Aortic Atherosclerosis (ICD10-170.0) Electronically Signed   By: Kathreen Devoid   On: 01/12/2016 10:48   Dg Foot Complete Left  Result Date: 12/19/2015 CLINICAL DATA:  Patient fell yesterday while getting out of bed, previous recent fall, pain in lower leg and throughout left ankle, tender to touch, difficult to obtain images- tech held for positioning EXAM: LEFT FOOT - COMPLETE 3+ VIEW COMPARISON:  None. FINDINGS: There is and oblique comminuted fracture of the lateral malleolus. There are plantar and Achilles spurs of the calcaneus. Vascular calcifications are present. IMPRESSION: Comminuted fracture of the lateral malleolus. Consider dedicated views of the ankle. These results will be called to the ordering clinician or representative by the Radiologist Assistant, and communication documented in the PACS or zVision Dashboard. Electronically Signed   By: Nolon Nations M.D.   On: 12/19/2015 09:34    Micro Results    Recent Results (from the past 240 hour(s))  MRSA PCR Screening     Status: None   Collection Time: 01/12/16  3:24 PM  Result Value Ref Range Status   MRSA by PCR NEGATIVE NEGATIVE Final    Comment:        The GeneXpert MRSA Assay (FDA approved for NASAL specimens only), is one component of  a comprehensive MRSA colonization surveillance program. It is not intended to diagnose MRSA infection nor to guide or monitor treatment for MRSA infections.        Today   Subjective:   Jill York today, alert, awake, oriented,. Stable for discharged to [facility] [place].  Objective:   Blood pressure 138/70, pulse 77, temperature 97.6 F (36.4 C), temperature source Oral, resp. rate (!) 22, height 5\' 3"  (1.6 m), weight 116.9 kg  (257 lb 11.2 oz), SpO2 100 %.   Intake/Output Summary (Last 24 hours) at 01/14/16 1213 Last data filed at 01/14/16 0900  Gross per 24 hour  Intake             1080 ml  Output             1050 ml  Net               30 ml    Exam Awake Alert, Oriented x 3, No new F.N deficits, Normal affect [place].AT,PERRAL Supple Neck,No JVD, No cervical lymphadenopathy appriciated.  Symmetrical Chest wall movement, Good air movement bilaterally, CTAB RRR,No Gallops,Rubs or new Murmurs, No Parasternal Heave +ve B.Sounds, Abd Soft, Non tender, No organomegaly appriciated, No rebound -guarding or rigidity. No Cyanosis, Clubbing or edema, No new Rash or bruise  Data Review   CBC w Diff:  Lab Results  Component Value Date   WBC 7.0 01/13/2016   HGB 8.8 (L) 01/13/2016   HGB 9.0 (L) 10/20/2014   HCT 27.3 (L) 01/13/2016   HCT 28.1 (L) 10/20/2014   PLT 214 01/13/2016   PLT 159 10/20/2014   LYMPHOPCT 12 01/12/2016   LYMPHOPCT 20.3 01/27/2014   MONOPCT 5 01/12/2016   MONOPCT 6.0 01/27/2014   EOSPCT 1 01/12/2016   EOSPCT 6.5 01/27/2014   BASOPCT 1 01/12/2016   BASOPCT 0.5 01/27/2014    CMP:  Lab Results  Component Value Date   NA 143 01/13/2016   NA 139 10/20/2014   K 4.4 01/13/2016   K 4.6 10/20/2014   CL 108 01/13/2016   CL 106 10/20/2014   CO2 26 01/13/2016   CO2 28 10/20/2014   BUN 50 (H) 01/13/2016   BUN 33 (H) 10/20/2014   CREATININE 2.24 (H) 01/13/2016   CREATININE 1.52 (H) 10/20/2014   PROT 6.9 01/12/2016   PROT 6.4 (L) 10/20/2014   ALBUMIN 3.2 (L) 01/12/2016   ALBUMIN 3.1 (L) 10/20/2014   BILITOT 0.5 01/12/2016   BILITOT 0.8 10/20/2014   ALKPHOS 136 (H) 01/12/2016   ALKPHOS 84 10/20/2014   AST 65 (H) 01/12/2016   AST 23 10/20/2014   ALT 52 01/12/2016   ALT 24 10/20/2014  .   Total Time in preparing paper work, data evaluation and todays exam - 75 minutes  Nakyla Bracco M.D on 01/14/2016 at 12:13 PM    Note: This dictation was prepared with Dragon dictation  along with smaller phrase technology. Any transcriptional errors that result from this process are unintentional.

## 2016-01-14 NOTE — Progress Notes (Signed)
Patient discharged via EMS transport to [facility], where she lived prior to admission. Daughter was her to assist with the transport.  Report was phones to [facility].  Prescriptions were sent to [facility].

## 2016-01-14 NOTE — Clinical Social Work Note (Signed)
Clinical Social Work Assessment  Patient Details  Name: Jill York MRN: [id number] Date of Birth: 20-Nov-1930  Date of referral:  01/13/16               Reason for consult:  Facility Placement                Permission sought to share information with:  Family Supports, Customer service manager Permission granted to share information::  Yes, Verbal Permission Granted  Name::     Jill York  Daughter  [phone] Son Stormy Card  from [place]- will coming soon  Agency::  [facility]  Relationship::  yes  Contact Information:  yes  Housing/Transportation Living arrangements for the past 2 months:  [place] of Information:  Patient Patient Interpreter Needed:  None Criminal Activity/Legal Involvement Pertinent to Current Situation/Hospitalization:  No - Comment as needed Significant Relationships:  Adult Children Lives with:  Self Do you feel safe going back to the place where you live?  Yes Need for family participation in patient care:  Yes (Comment)  Care giving concerns: Spoke and met with her daughter and was concerned about her mother received tramadol   Social Worker assessment / plan: LCSW met with patient and patients daughter Jill York. Patient has been at [facility] for less than 21 days with frequent  Ed visits- with issues. Patient orientation.x4. Patient has diabetes, and broken  Left ankle. Patient requires full assistance with her adls and is on a diabetic diet. Daughter is a great support to her mother and assured her mother they will private pat to ensure her well being. Insurance is Data processing manager care/Medicare  Employment status:  Retired Forensic scientist:   ([place]) PT Recommendations:  [place] / Referral to community resources:  [place]  Patient/Family's Response to care: Great care  Patient/Family's Understanding of and Emotional Response to Diagnosis, Current Treatment, and  Prognosis: I understand that I have to continue with PT to get stronger amd knows to ask her nurses and doctor  Emotional Assessment Appearance:    Attitude/Demeanor/Rapport:   (Polite. cooperative) Affect (typically observed):  Accepting, Adaptable, Pleasant Orientation:  Oriented to Self, Oriented to Place, Oriented to  Time, Oriented to Situation Alcohol / Substance use:  Never Used Psych involvement (Current and /or in the community):  No (Comment)  Discharge Needs  Concerns to be addressed:  No discharge needs identified Readmission within the last 30 days:  No Current discharge risk:  None Barriers to Discharge:      Joana Reamer, LCSW 01/14/2016, 10:56 AM

## 2016-01-17 DIAGNOSIS — I4891 Unspecified atrial fibrillation: Secondary | ICD-10-CM | POA: Diagnosis not present

## 2016-01-17 DIAGNOSIS — R4182 Altered mental status, unspecified: Secondary | ICD-10-CM | POA: Diagnosis not present

## 2016-01-17 DIAGNOSIS — N183 Chronic kidney disease, stage 3 (moderate): Secondary | ICD-10-CM | POA: Diagnosis not present

## 2016-01-17 DIAGNOSIS — J9611 Chronic respiratory failure with hypoxia: Secondary | ICD-10-CM | POA: Diagnosis not present

## 2016-01-18 ENCOUNTER — Ambulatory Visit: Payer: Medicare Other | Admitting: Family

## 2016-01-18 ENCOUNTER — Telehealth: Payer: Self-pay | Admitting: Family

## 2016-01-18 ENCOUNTER — Encounter: Payer: Self-pay | Admitting: Family

## 2016-01-18 NOTE — Telephone Encounter (Signed)
Patient did not show for her [facility] appointment on 01/18/16. Will attempt to reschedule.

## 2016-01-27 ENCOUNTER — Inpatient Hospital Stay
Admission: EM | Admit: 2016-01-27 | Discharge: 2016-02-08 | DRG: 291 | Disposition: A | Discharge status: Skilled Nursing Facility | Payer: Medicare Other | Attending: Internal Medicine | Admitting: Internal Medicine

## 2016-01-27 ENCOUNTER — Emergency Department: Payer: Medicare Other

## 2016-01-27 DIAGNOSIS — G4733 Obstructive sleep apnea (adult) (pediatric): Secondary | ICD-10-CM | POA: Diagnosis present

## 2016-01-27 DIAGNOSIS — I509 Heart failure, unspecified: Secondary | ICD-10-CM

## 2016-01-27 DIAGNOSIS — Z87891 Personal history of nicotine dependence: Secondary | ICD-10-CM

## 2016-01-27 DIAGNOSIS — Z823 Family history of stroke: Secondary | ICD-10-CM

## 2016-01-27 DIAGNOSIS — J441 Chronic obstructive pulmonary disease with (acute) exacerbation: Secondary | ICD-10-CM | POA: Diagnosis present

## 2016-01-27 DIAGNOSIS — E1122 Type 2 diabetes mellitus with diabetic chronic kidney disease: Secondary | ICD-10-CM | POA: Diagnosis present

## 2016-01-27 DIAGNOSIS — R0602 Shortness of breath: Secondary | ICD-10-CM

## 2016-01-27 DIAGNOSIS — E874 Mixed disorder of acid-base balance: Secondary | ICD-10-CM | POA: Diagnosis present

## 2016-01-27 DIAGNOSIS — Z452 Encounter for adjustment and management of vascular access device: Secondary | ICD-10-CM

## 2016-01-27 DIAGNOSIS — N183 Chronic kidney disease, stage 3 (moderate): Secondary | ICD-10-CM | POA: Diagnosis present

## 2016-01-27 DIAGNOSIS — I5043 Acute on chronic combined systolic (congestive) and diastolic (congestive) heart failure: Secondary | ICD-10-CM | POA: Diagnosis present

## 2016-01-27 DIAGNOSIS — E1140 Type 2 diabetes mellitus with diabetic neuropathy, unspecified: Secondary | ICD-10-CM | POA: Diagnosis present

## 2016-01-27 DIAGNOSIS — D62 Acute posthemorrhagic anemia: Secondary | ICD-10-CM | POA: Diagnosis present

## 2016-01-27 DIAGNOSIS — Z90710 Acquired absence of both cervix and uterus: Secondary | ICD-10-CM | POA: Diagnosis not present

## 2016-01-27 DIAGNOSIS — Z955 Presence of coronary angioplasty implant and graft: Secondary | ICD-10-CM

## 2016-01-27 DIAGNOSIS — C8590 Non-Hodgkin lymphoma, unspecified, unspecified site: Secondary | ICD-10-CM | POA: Diagnosis present

## 2016-01-27 DIAGNOSIS — Z66 Do not resuscitate: Secondary | ICD-10-CM | POA: Diagnosis present

## 2016-01-27 DIAGNOSIS — Z9889 Other specified postprocedural states: Secondary | ICD-10-CM | POA: Diagnosis not present

## 2016-01-27 DIAGNOSIS — H409 Unspecified glaucoma: Secondary | ICD-10-CM | POA: Diagnosis present

## 2016-01-27 DIAGNOSIS — Z800 Family history of malignant neoplasm of digestive organs: Secondary | ICD-10-CM

## 2016-01-27 DIAGNOSIS — Z9049 Acquired absence of other specified parts of digestive tract: Secondary | ICD-10-CM

## 2016-01-27 DIAGNOSIS — Z789 Other specified health status: Secondary | ICD-10-CM | POA: Diagnosis not present

## 2016-01-27 DIAGNOSIS — L0390 Cellulitis, unspecified: Secondary | ICD-10-CM | POA: Diagnosis present

## 2016-01-27 DIAGNOSIS — R531 Weakness: Secondary | ICD-10-CM

## 2016-01-27 DIAGNOSIS — R609 Edema, unspecified: Secondary | ICD-10-CM

## 2016-01-27 DIAGNOSIS — N179 Acute kidney failure, unspecified: Secondary | ICD-10-CM | POA: Diagnosis present

## 2016-01-27 DIAGNOSIS — J9601 Acute respiratory failure with hypoxia: Secondary | ICD-10-CM | POA: Diagnosis present

## 2016-01-27 DIAGNOSIS — D500 Iron deficiency anemia secondary to blood loss (chronic): Secondary | ICD-10-CM | POA: Diagnosis present

## 2016-01-27 DIAGNOSIS — I248 Other forms of acute ischemic heart disease: Secondary | ICD-10-CM | POA: Diagnosis present

## 2016-01-27 DIAGNOSIS — N2581 Secondary hyperparathyroidism of renal origin: Secondary | ICD-10-CM | POA: Diagnosis present

## 2016-01-27 DIAGNOSIS — Z515 Encounter for palliative care: Secondary | ICD-10-CM

## 2016-01-27 DIAGNOSIS — E875 Hyperkalemia: Secondary | ICD-10-CM | POA: Diagnosis present

## 2016-01-27 DIAGNOSIS — I130 Hypertensive heart and chronic kidney disease with heart failure and stage 1 through stage 4 chronic kidney disease, or unspecified chronic kidney disease: Secondary | ICD-10-CM | POA: Diagnosis present

## 2016-01-27 DIAGNOSIS — E11319 Type 2 diabetes mellitus with unspecified diabetic retinopathy without macular edema: Secondary | ICD-10-CM | POA: Diagnosis present

## 2016-01-27 DIAGNOSIS — Z888 Allergy status to other drugs, medicaments and biological substances status: Secondary | ICD-10-CM

## 2016-01-27 DIAGNOSIS — I5033 Acute on chronic diastolic (congestive) heart failure: Secondary | ICD-10-CM

## 2016-01-27 DIAGNOSIS — I4891 Unspecified atrial fibrillation: Secondary | ICD-10-CM | POA: Diagnosis present

## 2016-01-27 LAB — URINALYSIS COMPLETE WITH MICROSCOPIC (ARMC ONLY)
Bilirubin Urine: NEGATIVE
Glucose, UA: NEGATIVE mg/dL
Hgb urine dipstick: NEGATIVE
KETONES UR: NEGATIVE mg/dL
Leukocytes, UA: NEGATIVE
Nitrite: NEGATIVE
PH: 5 (ref 5.0–8.0)
PROTEIN: NEGATIVE mg/dL
SPECIFIC GRAVITY, URINE: 1.013 (ref 1.005–1.030)

## 2016-01-27 LAB — CBC WITH DIFFERENTIAL/PLATELET
BASOS ABS: 0 10*3/uL (ref 0–0.1)
Basophils Relative: 1 %
EOS PCT: 3 %
Eosinophils Absolute: 0.2 10*3/uL (ref 0–0.7)
HCT: 27.6 % — ABNORMAL LOW (ref 35.0–47.0)
HEMOGLOBIN: 8.6 g/dL — AB (ref 12.0–16.0)
LYMPHS ABS: 0.8 10*3/uL — AB (ref 1.0–3.6)
LYMPHS PCT: 12 %
MCH: 29.8 pg (ref 26.0–34.0)
MCHC: 31.3 g/dL — ABNORMAL LOW (ref 32.0–36.0)
MCV: 95.2 fL (ref 80.0–100.0)
Monocytes Absolute: 0.5 10*3/uL (ref 0.2–0.9)
Monocytes Relative: 7 %
NEUTROS ABS: 5 10*3/uL (ref 1.4–6.5)
NEUTROS PCT: 77 %
PLATELETS: 155 10*3/uL (ref 150–440)
RBC: 2.9 MIL/uL — AB (ref 3.80–5.20)
RDW: 19.6 % — ABNORMAL HIGH (ref 11.5–14.5)
WBC: 6.5 10*3/uL (ref 3.6–11.0)

## 2016-01-27 LAB — BASIC METABOLIC PANEL
ANION GAP: 3 — AB (ref 5–15)
BUN: 64 mg/dL — AB (ref 6–20)
CALCIUM: 8.5 mg/dL — AB (ref 8.9–10.3)
CO2: 33 mmol/L — AB (ref 22–32)
Chloride: 107 mmol/L (ref 101–111)
Creatinine, Ser: 2.46 mg/dL — ABNORMAL HIGH (ref 0.44–1.00)
GFR calc non Af Amer: 17 mL/min — ABNORMAL LOW (ref >60)
GFR, EST AFRICAN AMERICAN: 20 mL/min — AB (ref >60)
GLUCOSE: 60 mg/dL — AB (ref 65–99)
Potassium: 5.4 mmol/L — ABNORMAL HIGH (ref 3.5–5.1)
Sodium: 143 mmol/L (ref 135–145)

## 2016-01-27 LAB — TROPONIN I
TROPONIN I: 0.11 ng/mL — AB (ref <0.03)
Troponin I: 0.14 ng/mL (ref <0.03)

## 2016-01-27 LAB — GLUCOSE, CAPILLARY
GLUCOSE-CAPILLARY: 42 mg/dL — AB (ref 65–99)
GLUCOSE-CAPILLARY: 87 mg/dL (ref 65–99)
GLUCOSE-CAPILLARY: 248 mg/dL — AB (ref 65–99)

## 2016-01-27 LAB — BRAIN NATRIURETIC PEPTIDE: B NATRIURETIC PEPTIDE 5: 397 pg/mL — AB (ref 0.0–100.0)

## 2016-01-27 LAB — TSH: TSH: 3.251 u[IU]/mL (ref 0.350–4.500)

## 2016-01-27 MED ORDER — FUROSEMIDE 10 MG/ML IJ SOLN
INTRAMUSCULAR | Status: AC
  Filled 2016-01-27: qty 4

## 2016-01-27 MED ORDER — LATANOPROST 0.005 % OP SOLN
1.0000 [drp] | Freq: Every day | OPHTHALMIC | Status: DC
  Administered 2016-01-29 – 2016-02-07 (×10): 1 [drp] via OPHTHALMIC
  Filled 2016-01-27 (×2): qty 2.5

## 2016-01-27 MED ORDER — SODIUM CHLORIDE 0.9% FLUSH
3.0000 mL | Freq: Two times a day (BID) | INTRAVENOUS | Status: DC
  Administered 2016-01-30 – 2016-02-06 (×8): 3 mL via INTRAVENOUS

## 2016-01-27 MED ORDER — LEVOTHYROXINE SODIUM 137 MCG PO TABS
137.0000 ug | ORAL_TABLET | ORAL | Status: DC
  Administered 2016-01-28 – 2016-02-08 (×11): 137 ug via ORAL
  Filled 2016-01-27 (×12): qty 1

## 2016-01-27 MED ORDER — ATORVASTATIN CALCIUM 20 MG PO TABS
80.0000 mg | ORAL_TABLET | Freq: Every day | ORAL | Status: DC
  Administered 2016-01-28 – 2016-02-08 (×12): 80 mg via ORAL
  Filled 2016-01-27 (×13): qty 4

## 2016-01-27 MED ORDER — GABAPENTIN 300 MG PO CAPS
300.0000 mg | ORAL_CAPSULE | Freq: Two times a day (BID) | ORAL | Status: DC
  Administered 2016-01-28 – 2016-02-08 (×23): 300 mg via ORAL
  Filled 2016-01-27 (×23): qty 1

## 2016-01-27 MED ORDER — DOCUSATE SODIUM 100 MG PO CAPS: 100.0000 mg | ORAL_CAPSULE | Freq: Every day | ORAL | Status: DC | PRN

## 2016-01-27 MED ORDER — BACLOFEN 10 MG PO TABS: 5.0000 mg | ORAL_TABLET | Freq: Every evening | ORAL | Status: DC | PRN

## 2016-01-27 MED ORDER — MONTELUKAST SODIUM 10 MG PO TABS
10.0000 mg | ORAL_TABLET | Freq: Every day | ORAL | Status: DC
  Administered 2016-01-28 – 2016-02-07 (×11): 10 mg via ORAL
  Filled 2016-01-27 (×12): qty 1

## 2016-01-27 MED ORDER — IPRATROPIUM-ALBUTEROL 0.5-2.5 (3) MG/3ML IN SOLN: 9.0000 mL | Freq: Once | RESPIRATORY_TRACT | Status: DC

## 2016-01-27 MED ORDER — IPRATROPIUM-ALBUTEROL 0.5-2.5 (3) MG/3ML IN SOLN
3.0000 mL | Freq: Once | RESPIRATORY_TRACT | Status: AC
  Administered 2016-01-27: 3 mL via RESPIRATORY_TRACT

## 2016-01-27 MED ORDER — NAPHAZOLINE-GLYCERIN 0.012-0.2 % OP SOLN
1.0000 [drp] | Freq: Four times a day (QID) | OPHTHALMIC | Status: DC | PRN
  Filled 2016-01-27: qty 15

## 2016-01-27 MED ORDER — FUROSEMIDE 10 MG/ML IJ SOLN
40.0000 mg | Freq: Two times a day (BID) | INTRAMUSCULAR | Status: DC
  Administered 2016-01-27: 40 mg via INTRAVENOUS
  Filled 2016-01-27: qty 4

## 2016-01-27 MED ORDER — VENLAFAXINE HCL 25 MG PO TABS
75.0000 mg | ORAL_TABLET | Freq: Two times a day (BID) | ORAL | Status: DC
  Administered 2016-01-28 – 2016-02-08 (×23): 75 mg via ORAL
  Filled 2016-01-27 (×8): qty 3
  Filled 2016-01-27: qty 1
  Filled 2016-01-27 (×2): qty 3
  Filled 2016-01-27: qty 1
  Filled 2016-01-27 (×12): qty 3
  Filled 2016-01-27: qty 1

## 2016-01-27 MED ORDER — INSULIN ASPART 100 UNIT/ML ~~LOC~~ SOLN: 20.0000 [IU] | Freq: Three times a day (TID) | SUBCUTANEOUS | Status: DC

## 2016-01-27 MED ORDER — DOXYCYCLINE HYCLATE 100 MG IV SOLR
100.0000 mg | Freq: Once | INTRAVENOUS | Status: AC
  Administered 2016-01-27: 100 mg via INTRAVENOUS
  Filled 2016-01-27: qty 100

## 2016-01-27 MED ORDER — SODIUM CHLORIDE 0.9% FLUSH
3.0000 mL | Freq: Two times a day (BID) | INTRAVENOUS | Status: DC
  Administered 2016-01-30 – 2016-02-02 (×4): 3 mL via INTRAVENOUS

## 2016-01-27 MED ORDER — LORAZEPAM 2 MG/ML IJ SOLN
0.2500 mg | Freq: Once | INTRAMUSCULAR | Status: AC
  Administered 2016-01-27: 0.25 mg via INTRAVENOUS
  Filled 2016-01-27: qty 1

## 2016-01-27 MED ORDER — LEVOTHYROXINE SODIUM 137 MCG PO TABS: 137.0000 ug | ORAL_TABLET | Freq: Every day | ORAL | Status: DC

## 2016-01-27 MED ORDER — LOSARTAN POTASSIUM 25 MG PO TABS
12.5000 mg | ORAL_TABLET | Freq: Every day | ORAL | Status: DC
  Administered 2016-01-28: 12.5 mg via ORAL
  Filled 2016-01-27: qty 1

## 2016-01-27 MED ORDER — METOPROLOL TARTRATE 25 MG PO TABS: 12.5000 mg | ORAL_TABLET | Freq: Two times a day (BID) | ORAL | Status: DC

## 2016-01-27 MED ORDER — PANTOPRAZOLE SODIUM 40 MG PO TBEC
40.0000 mg | DELAYED_RELEASE_TABLET | Freq: Two times a day (BID) | ORAL | Status: DC
Start: 2016-01-27 — End: 2016-02-08
  Administered 2016-01-28 – 2016-02-08 (×23): 40 mg via ORAL
  Filled 2016-01-27 (×23): qty 1

## 2016-01-27 MED ORDER — SODIUM CHLORIDE 0.9% FLUSH: 3.0000 mL | INTRAVENOUS | Status: DC | PRN

## 2016-01-27 MED ORDER — FUROSEMIDE 10 MG/ML IJ SOLN
20.0000 mg | Freq: Once | INTRAMUSCULAR | Status: AC
  Administered 2016-01-27: 20 mg via INTRAVENOUS

## 2016-01-27 MED ORDER — ALBUTEROL SULFATE (2.5 MG/3ML) 0.083% IN NEBU
3.0000 mL | INHALATION_SOLUTION | RESPIRATORY_TRACT | Status: DC | PRN
  Administered 2016-02-01 – 2016-02-05 (×6): 3 mL via RESPIRATORY_TRACT
  Filled 2016-01-27 (×6): qty 3

## 2016-01-27 MED ORDER — VITAMIN D 1000 UNITS PO TABS
1000.0000 [IU] | ORAL_TABLET | Freq: Every day | ORAL | Status: DC
  Administered 2016-01-28 – 2016-02-08 (×12): 1000 [IU] via ORAL
  Filled 2016-01-27 (×12): qty 1

## 2016-01-27 MED ORDER — POLYETHYLENE GLYCOL 3350 17 G PO PACK
17.0000 g | PACK | Freq: Every day | ORAL | Status: DC
  Administered 2016-01-30 – 2016-02-08 (×6): 17 g via ORAL
  Filled 2016-01-27 (×9): qty 1

## 2016-01-27 MED ORDER — INSULIN ASPART 100 UNIT/ML ~~LOC~~ SOLN
0.0000 [IU] | Freq: Every day | SUBCUTANEOUS | Status: DC
  Administered 2016-01-27 – 2016-02-02 (×5): 2 [IU] via SUBCUTANEOUS
  Administered 2016-02-03: 3 [IU] via SUBCUTANEOUS
  Administered 2016-02-04: 4 [IU] via SUBCUTANEOUS
  Administered 2016-02-05: 2 [IU] via SUBCUTANEOUS
  Administered 2016-02-07: 4 [IU] via SUBCUTANEOUS
  Filled 2016-01-27: qty 4
  Filled 2016-01-27 (×2): qty 2
  Filled 2016-01-27: qty 3
  Filled 2016-01-27 (×3): qty 2
  Filled 2016-01-27: qty 3
  Filled 2016-01-27: qty 2

## 2016-01-27 MED ORDER — FLUTICASONE PROPIONATE 50 MCG/ACT NA SUSP
1.0000 | Freq: Every day | NASAL | Status: DC
  Administered 2016-01-28 – 2016-02-08 (×11): 1 via NASAL
  Filled 2016-01-27: qty 16

## 2016-01-27 MED ORDER — METHYLPREDNISOLONE SODIUM SUCC 125 MG IJ SOLR
125.0000 mg | Freq: Once | INTRAMUSCULAR | Status: AC
  Administered 2016-01-27: 125 mg via INTRAVENOUS
  Filled 2016-01-27: qty 2

## 2016-01-27 MED ORDER — ACETAMINOPHEN 500 MG PO TABS
1000.0000 mg | ORAL_TABLET | Freq: Four times a day (QID) | ORAL | Status: DC | PRN
  Administered 2016-01-28 – 2016-02-05 (×3): 1000 mg via ORAL
  Filled 2016-01-27 (×4): qty 2

## 2016-01-27 MED ORDER — SENNOSIDES-DOCUSATE SODIUM 8.6-50 MG PO TABS
2.0000 | ORAL_TABLET | Freq: Every day | ORAL | Status: DC
  Administered 2016-01-28 – 2016-02-08 (×11): 2 via ORAL
  Filled 2016-01-27 (×11): qty 2

## 2016-01-27 MED ORDER — PANTOPRAZOLE SODIUM 40 MG PO TBEC: 40.0000 mg | DELAYED_RELEASE_TABLET | Freq: Every day | ORAL | Status: DC

## 2016-01-27 MED ORDER — CALCITRIOL 0.25 MCG PO CAPS
0.2500 ug | ORAL_CAPSULE | ORAL | Status: DC
  Administered 2016-01-29 – 2016-02-07 (×5): 0.25 ug via ORAL
  Filled 2016-01-27 (×5): qty 1

## 2016-01-27 MED ORDER — LEVOTHYROXINE SODIUM 137 MCG PO TABS
274.0000 ug | ORAL_TABLET | ORAL | Status: DC
  Administered 2016-02-02: 274 ug via ORAL
  Filled 2016-01-27: qty 2

## 2016-01-27 MED ORDER — SODIUM CHLORIDE 0.9 % IV SOLN: 250.0000 mL | INTRAVENOUS | Status: DC | PRN

## 2016-01-27 MED ORDER — BISACODYL 5 MG PO TBEC: 5.0000 mg | DELAYED_RELEASE_TABLET | Freq: Every day | ORAL | Status: DC | PRN

## 2016-01-27 MED ORDER — MOMETASONE FURO-FORMOTEROL FUM 200-5 MCG/ACT IN AERO
2.0000 | INHALATION_SPRAY | Freq: Two times a day (BID) | RESPIRATORY_TRACT | Status: DC
  Administered 2016-01-27 – 2016-02-08 (×23): 2 via RESPIRATORY_TRACT
  Filled 2016-01-27: qty 8.8

## 2016-01-27 MED ORDER — ONDANSETRON HCL 4 MG PO TABS: 4.0000 mg | ORAL_TABLET | Freq: Four times a day (QID) | ORAL | Status: DC | PRN

## 2016-01-27 MED ORDER — INSULIN GLARGINE 100 UNIT/ML ~~LOC~~ SOLN
54.0000 [IU] | Freq: Every day | SUBCUTANEOUS | Status: DC
  Administered 2016-01-27 – 2016-01-30 (×4): 54 [IU] via SUBCUTANEOUS
  Filled 2016-01-27 (×5): qty 0.54

## 2016-01-27 MED ORDER — INSULIN ASPART 100 UNIT/ML ~~LOC~~ SOLN
0.0000 [IU] | Freq: Three times a day (TID) | SUBCUTANEOUS | Status: DC
  Administered 2016-01-28: 4 [IU] via SUBCUTANEOUS
  Administered 2016-01-28 – 2016-01-29 (×3): 7 [IU] via SUBCUTANEOUS
  Administered 2016-01-29 – 2016-01-31 (×2): 4 [IU] via SUBCUTANEOUS
  Administered 2016-01-31 – 2016-02-01 (×2): 7 [IU] via SUBCUTANEOUS
  Administered 2016-02-01: 11 [IU] via SUBCUTANEOUS
  Administered 2016-02-02: 7 [IU] via SUBCUTANEOUS
  Administered 2016-02-02 – 2016-02-03 (×2): 3 [IU] via SUBCUTANEOUS
  Administered 2016-02-03 – 2016-02-04 (×3): 7 [IU] via SUBCUTANEOUS
  Administered 2016-02-04: 4 [IU] via SUBCUTANEOUS
  Administered 2016-02-05 (×2): 7 [IU] via SUBCUTANEOUS
  Administered 2016-02-05: 11 [IU] via SUBCUTANEOUS
  Administered 2016-02-06 (×2): 3 [IU] via SUBCUTANEOUS
  Administered 2016-02-06 – 2016-02-07 (×3): 4 [IU] via SUBCUTANEOUS
  Administered 2016-02-07: 3 [IU] via SUBCUTANEOUS
  Administered 2016-02-08: 7 [IU] via SUBCUTANEOUS
  Administered 2016-02-08: 3 [IU] via SUBCUTANEOUS
  Filled 2016-01-27 (×2): qty 7
  Filled 2016-01-27: qty 4
  Filled 2016-01-27: qty 7
  Filled 2016-01-27: qty 4
  Filled 2016-01-27: qty 3
  Filled 2016-01-27 (×2): qty 4
  Filled 2016-01-27: qty 11
  Filled 2016-01-27: qty 4
  Filled 2016-01-27: qty 3
  Filled 2016-01-27: qty 4
  Filled 2016-01-27: qty 7
  Filled 2016-01-27: qty 4
  Filled 2016-01-27: qty 7
  Filled 2016-01-27 (×2): qty 3
  Filled 2016-01-27 (×2): qty 7
  Filled 2016-01-27: qty 3
  Filled 2016-01-27: qty 11
  Filled 2016-01-27: qty 9
  Filled 2016-01-27 (×2): qty 7
  Filled 2016-01-27: qty 4

## 2016-01-27 MED ORDER — NITROGLYCERIN 0.4 MG SL SUBL: 0.4000 mg | SUBLINGUAL_TABLET | SUBLINGUAL | Status: DC | PRN

## 2016-01-27 MED ORDER — ALBUTEROL SULFATE (2.5 MG/3ML) 0.083% IN NEBU: 2.5000 mg | INHALATION_SOLUTION | Freq: Four times a day (QID) | RESPIRATORY_TRACT | Status: DC | PRN

## 2016-01-27 NOTE — H&P (Signed)
Jill York is an 82 y.o. female.   Chief Complaint: Shortness of breath HPI: Has had 3 day history of worsening shortness of breath and genelrized edema. Rehab reports she gained 10 pounds overnight. She is alert but cannot answer in complete sentences due to shortness of breath. She denies chest pain.  Past Medical History:  Diagnosis Date  . Anemia   . Anxiety and depression    [name]  . Arrhythmia    PVC  . CHF (congestive heart failure) ([place])   . Chronic kidney disease   . COPD (chronic obstructive pulmonary disease) ([place])   . Diabetes mellitus without complication ([place])   . Hyperlipidemia   . Hypertension   . Lymphedema   . Lymphoma ([place])   . OSA (obstructive sleep apnea) 2011  . Osteoarthritis   . Thyroid disease     Past Surgical History:  Procedure Laterality Date  . ABDOMINAL HYSTERECTOMY    . APPENDECTOMY    . CARDIAC CATHETERIZATION    . cardiac stents    . CORONARY ANGIOPLASTY     x4  . ESOPHAGOGASTRODUODENOSCOPY (EGD) WITH PROPOFOL N/A 12/27/2015   Procedure: ESOPHAGOGASTRODUODENOSCOPY (EGD) WITH PROPOFOL;  Surgeon: Lollie Sails, MD;  Location: [facility] ENDOSCOPY;  Service: Endoscopy;  Laterality: N/A;  Late afternoon case  . ESOPHAGOGASTRODUODENOSCOPY (EGD) WITH PROPOFOL N/A 01/03/2016   Procedure: ESOPHAGOGASTRODUODENOSCOPY (EGD) WITH PROPOFOL;  Surgeon: Lollie Sails, MD;  Location: [facility] ENDOSCOPY;  Service: Endoscopy;  Laterality: N/A;  . KNEE SURGERY Left   . TONSILLECTOMY AND ADENOIDECTOMY      Family History  Problem Relation Age of Onset  . Aneurysm Mother   . Stroke Mother   . Liver cancer Father    Social History:  reports that she quit smoking about 40 years ago. She has a 27.00 pack-year smoking history. She has never used smokeless tobacco. She reports that she does not drink alcohol or use drugs.  Allergies:  Allergies  Allergen Reactions  . Amlodipine Swelling    Leg swelling only   . Oxycodone Other  (See Comments)    Reaction: patient states she was "out" for 2 hours and did not notice people in the area    . Demerol [Meperidine] Diarrhea and Nausea And Vomiting  . Lisinopril Cough  . Tape Rash     (Not in a hospital admission)  Results for orders placed or performed during the hospital encounter of 01/27/16 (from the past 48 hour(s))  CBC with Differential     Status: Abnormal   Collection Time: 01/27/16 11:16 AM  Result Value Ref Range   WBC 6.5 3.6 - 11.0 K/uL   RBC 2.90 (L) 3.80 - 5.20 MIL/uL   Hemoglobin 8.6 (L) 12.0 - 16.0 g/dL   HCT 27.6 (L) 35.0 - 47.0 %   MCV 95.2 80.0 - 100.0 fL   MCH 29.8 26.0 - 34.0 pg   MCHC 31.3 (L) 32.0 - 36.0 g/dL   RDW 19.6 (H) 11.5 - 14.5 %   Platelets 155 150 - 440 K/uL   Neutrophils Relative % 77 %   Neutro Abs 5.0 1.4 - 6.5 K/uL   Lymphocytes Relative 12 %   Lymphs Abs 0.8 (L) 1.0 - 3.6 K/uL   Monocytes Relative 7 %   Monocytes Absolute 0.5 0.2 - 0.9 K/uL   Eosinophils Relative 3 %   Eosinophils Absolute 0.2 0 - 0.7 K/uL   Basophils Relative 1 %   Basophils Absolute 0.0  0 - 0.1 K/uL  Brain natriuretic peptide     Status: Abnormal   Collection Time: 01/27/16 11:16 AM  Result Value Ref Range   B Natriuretic Peptide 397.0 (H) 0.0 - 100.0 pg/mL  Basic metabolic panel     Status: Abnormal   Collection Time: 01/27/16 11:16 AM  Result Value Ref Range   Sodium 143 135 - 145 mmol/L   Potassium 5.4 (H) 3.5 - 5.1 mmol/L   Chloride 107 101 - 111 mmol/L   CO2 33 (H) 22 - 32 mmol/L   Glucose, Bld 60 (L) 65 - 99 mg/dL   BUN 64 (H) 6 - 20 mg/dL   Creatinine, Ser 2.46 (H) 0.44 - 1.00 mg/dL   Calcium 8.5 (L) 8.9 - 10.3 mg/dL   GFR calc non Af Amer 17 (L) >60 mL/min   GFR calc Af Amer 20 (L) >60 mL/min    Comment: (NOTE) The eGFR has been calculated using the CKD EPI equation. This calculation has not been validated in all clinical situations. eGFR's persistently <60 mL/min signify possible Chronic Kidney Disease.    Anion gap 3 (L) 5 -  15  Troponin I     Status: Abnormal   Collection Time: 01/27/16 11:16 AM  Result Value Ref Range   Troponin I 0.14 (HH) <0.03 ng/mL    Comment: CRITICAL RESULT CALLED TO, READ BACK BY AND VERIFIED WITH FELICIA STAROPOLI AT 8338 01/27/16 DAS   TSH     Status: None   Collection Time: 01/27/16 11:16 AM  Result Value Ref Range   TSH 3.251 0.350 - 4.500 uIU/mL  Urinalysis complete, with microscopic ([facility] only)     Status: Abnormal   Collection Time: 01/27/16 12:24 PM  Result Value Ref Range   Color, Urine YELLOW (A) YELLOW   APPearance CLOUDY (A) CLEAR   Glucose, UA NEGATIVE NEGATIVE mg/dL   Bilirubin Urine NEGATIVE NEGATIVE   Ketones, ur NEGATIVE NEGATIVE mg/dL   Specific Gravity, Urine 1.013 1.005 - 1.030   Hgb urine dipstick NEGATIVE NEGATIVE   pH 5.0 5.0 - 8.0   Protein, ur NEGATIVE NEGATIVE mg/dL   Nitrite NEGATIVE NEGATIVE   Leukocytes, UA NEGATIVE NEGATIVE   RBC / HPF 0-5 0 - 5 RBC/hpf   WBC, UA 6-30 0 - 5 WBC/hpf   Bacteria, UA RARE (A) NONE SEEN   Squamous Epithelial / LPF 6-30 (A) NONE SEEN   Mucous PRESENT    Dg Chest 1 View  Result Date: 01/27/2016 CLINICAL DATA:  Pt came to ED from [facility]. Pt in rehab for broken ankle. Pt has had sob for the past week, increased confusion, and weight gain 10lbs overnight. Per chart, h/o copd, chf EXAM: CHEST 1 VIEW COMPARISON:  7217 FINDINGS: Enlarged cardiac silhouette. There is central venous congestion similar prior. Mild interstitial edema. No focal infiltrate. No pneumothorax. IMPRESSION: Cardiomegaly and interstitial edema suggests congestive heart failure. No significant change. Electronically Signed   By: Suzy Bouchard M.D.   On: 01/27/2016 12:47    Review of Systems  Constitutional: Negative for chills and fever.  HENT: Negative for hearing loss.   Eyes: Negative for blurred vision.  Respiratory: Negative for shortness of breath and wheezing.   Cardiovascular: Negative for chest pain.  Gastrointestinal: Negative  for nausea and vomiting.  Genitourinary: Negative for dysuria.  Musculoskeletal: Negative for back pain.  Skin: Negative for rash.  Neurological: Negative for sensory change.    Blood pressure 125/71, pulse 72, temperature 97.8 F (36.6 C),  temperature source Oral, resp. rate 14, height 5' 4"  (1.626 m), weight 117.9 kg (260 lb), SpO2 99 %. Physical Exam  Constitutional: She is oriented to person, place, and time. She appears well-developed and well-nourished.  Obese female in mild resp distress  HENT:  Head: Normocephalic.  Mouth/Throat: Oropharynx is clear and moist. No oropharyngeal exudate.  Eyes: Pupils are equal, round, and reactive to light. Left eye exhibits no discharge.  Neck: Normal range of motion. Neck supple. No JVD present. No tracheal deviation present. No thyromegaly present.  Cardiovascular:  No murmur heard. Irregular rate  Respiratory:  Bibasilar crackles No dullness to percussion Mild use of accessary muscles  GI: Soft. Bowel sounds are normal. She exhibits no mass.  Musculoskeletal: She exhibits edema. She exhibits no tenderness.  Lymphadenopathy:    She has no cervical adenopathy.  Neurological: She is alert and oriented to person, place, and time. No cranial nerve deficit.  Skin: Skin is warm and dry. Rash noted. No erythema.     Assessment/Plan 1. CHF: CXR show pulmonary edema. Will start IV lasix and follow.  2. Acute Respiratory Failure: She is in mild respiratory distress. On oxygen. Likely secondary to above. Not wheezing but has COPD. Do not suspect exacerbation but will continue bronchodilators.  3. CKD: Near baseline. Follow closely with diuresis.  4. Peripheral Edema: Likely secondary to CHF above. Monitor as diurese.  5. Code Status: DNR  Time spent: 45 min  Baxter Hire, MD 01/27/2016, 2:39 PM

## 2016-01-27 NOTE — ED Notes (Signed)
Pt given meal. Went to check blood sugar prior to meal, pt blood sugar 42. Given 4oz orange juice. Rechecked blood sugar. Pt blood sugar 87. Pt was never symptomatic.

## 2016-01-27 NOTE — ED Notes (Signed)
Pt placed on 4L [place]. Satting 100%.

## 2016-01-27 NOTE — ED Provider Notes (Signed)
[facility] Emergency Department Provider Note   ____________________________________________  Seen upon arrival to the emergency department    I have reviewed the triage vital signs and the nursing notes.   HISTORY  Chief Complaint Respiratory Distress    HPI NAVA HAZELRIGG is a 82 y.o. female with a history of CHF as well as COPD who is presenting to the emergency department with shortness of breath, hypotension as well as hypoxia.  The patient is denying any pain. She has a baseline level of confusion and is unable to state exactly when or shortness of breath started. Per EMS, she has gained about 10 pounds overnight and is edematous throughout. Upon EMS arrival she was saturating in the low 80s with nasal cannula but resolved to the high 90s with a nonrebreather. No further intervention was performed by EMS.   Past Medical History:  Diagnosis Date  . Anemia   . Anxiety and depression    [name]  . Arrhythmia    PVC  . CHF (congestive heart failure) ([place])   . Chronic kidney disease   . COPD (chronic obstructive pulmonary disease) ([place])   . Diabetes mellitus without complication ([place])   . Hyperlipidemia   . Hypertension   . Lymphedema   . Lymphoma ([place])   . OSA (obstructive sleep apnea) 2011  . Osteoarthritis   . Thyroid disease     Patient Active Problem List   Diagnosis Date Noted  . Acute encephalopathy 01/12/2016  . Episodes of formed visual hallucinations 12/18/2015  . NSTEMI (non-ST elevated myocardial infarction) ([place]) 12/15/2015  . Acute blood loss anemia   . Hematochezia   . Acute respiratory failure with hypoxia ([place]) 01/01/2015  . Chronic diastolic heart failure ([place]) 11/01/2014    Past Surgical History:  Procedure Laterality Date  . ABDOMINAL HYSTERECTOMY    . APPENDECTOMY    . CARDIAC CATHETERIZATION    . cardiac stents    . CORONARY ANGIOPLASTY     x4  . ESOPHAGOGASTRODUODENOSCOPY  (EGD) WITH PROPOFOL N/A 12/27/2015   Procedure: ESOPHAGOGASTRODUODENOSCOPY (EGD) WITH PROPOFOL;  Surgeon: Lollie Sails, MD;  Location: [facility] ENDOSCOPY;  Service: Endoscopy;  Laterality: N/A;  Late afternoon case  . ESOPHAGOGASTRODUODENOSCOPY (EGD) WITH PROPOFOL N/A 01/03/2016   Procedure: ESOPHAGOGASTRODUODENOSCOPY (EGD) WITH PROPOFOL;  Surgeon: Lollie Sails, MD;  Location: [facility] ENDOSCOPY;  Service: Endoscopy;  Laterality: N/A;  . KNEE SURGERY Left   . TONSILLECTOMY AND ADENOIDECTOMY      Prior to Admission medications   Medication Sig Start Date End Date Taking? Authorizing Provider  acetaminophen (TYLENOL) 500 MG tablet Take 1,000 mg by mouth every 6 (six) hours as needed.    Historical Provider, MD  albuterol (PROVENTIL HFA;VENTOLIN HFA) 108 (90 BASE) MCG/ACT inhaler Inhale 1 puff into the lungs every 4 (four) hours as needed for wheezing or shortness of breath.     Historical Provider, MD  albuterol (PROVENTIL) (2.5 MG/3ML) 0.083% nebulizer solution Take 2.5 mg by nebulization every 6 (six) hours as needed for wheezing or shortness of breath.    Historical Provider, MD  amLODipine (NORVASC) 5 MG tablet Take 5 mg by mouth daily.    Historical Provider, MD  atorvastatin (LIPITOR) 80 MG tablet Take 80 mg by mouth daily.    Historical Provider, MD  baclofen (LIORESAL) 10 MG tablet Take 5 mg by mouth at bedtime as needed for muscle spasms.    Historical Provider, MD  bisacodyl (DULCOLAX) 5  MG EC tablet Take 5 mg by mouth daily as needed for moderate constipation.    Historical Provider, MD  calcitRIOL (ROCALTROL) 0.25 MCG capsule Take 0.25 mcg by mouth every Monday, Wednesday, and Friday.    Historical Provider, MD  Cholecalciferol 1000 UNITS tablet Take 1,000 Units by mouth daily.    Historical Provider, MD  docusate sodium (COLACE) 100 MG capsule Take 100 mg by mouth daily as needed for mild constipation or moderate constipation.     Historical Provider, MD  esomeprazole (NEXIUM) 20 MG  capsule Take 20 mg by mouth daily as needed (for acid reflux).    Historical Provider, MD  fluticasone (FLONASE) 50 MCG/ACT nasal spray Place 1 spray into both nostrils daily.    Historical Provider, MD  Fluticasone-Salmeterol (ADVAIR) 250-50 MCG/DOSE AEPB Inhale 1 puff into the lungs 2 (two) times daily.    Historical Provider, MD  furosemide (LASIX) 40 MG tablet Take 0.5 tablets (20 mg total) by mouth daily. 01/14/16   Epifanio Lesches, MD  gabapentin (NEURONTIN) 300 MG capsule Take 300 mg by mouth 2 (two) times daily.    Historical Provider, MD  insulin glargine (LANTUS) 100 UNIT/ML injection Inject 54 Units into the skin at bedtime.     Historical Provider, MD  insulin lispro (HUMALOG) 100 UNIT/ML injection Inject 20-26 Units into the skin 3 (three) times daily with meals. Inject 20 units sub-q in morning, inject 26 units sub-q in afternoon, and inject 20 units sub-q in evening    Historical Provider, MD  latanoprost (XALATAN) 0.005 % ophthalmic solution Place 1 drop into both eyes at bedtime.    Historical Provider, MD  levothyroxine (SYNTHROID, LEVOTHROID) 137 MCG tablet Take 137-274 mcg by mouth daily before breakfast. Take 173mcg (1 tablet) daily on Mon, Tues, Wed, Thurs, Sat, and Sun. Take 212mcg (2 tabs) daily on Friday. (Take with a glass of water at least 30 to 60 minutes before breakfast)    Historical Provider, MD  losartan (COZAAR) 25 MG tablet Take 0.5 tablets (12.5 mg total) by mouth daily. 01/14/16   Epifanio Lesches, MD  metoprolol tartrate (LOPRESSOR) 25 MG tablet Take 0.5 tablets (12.5 mg total) by mouth 2 (two) times daily. 01/03/15   Loletha Grayer, MD  montelukast (SINGULAIR) 10 MG tablet Take 10 mg by mouth at bedtime.    Historical Provider, MD  nitroGLYCERIN (NITROSTAT) 0.4 MG SL tablet Place 0.4 mg under the tongue every 5 (five) minutes as needed for chest pain.    Historical Provider, MD  nystatin (MYCOSTATIN) 100000 UNIT/ML suspension Take 5 mLs (500,000 Units total)  by mouth 4 (four) times daily. X 13 more days 01/04/16   Gladstone Lighter, MD  ondansetron (ZOFRAN) 4 MG tablet Take 1 tablet (4 mg total) by mouth every 6 (six) hours as needed for nausea. 01/14/16   Epifanio Lesches, MD  pantoprazole (PROTONIX) 40 MG tablet Take 1 tablet (40 mg total) by mouth 2 (two) times daily before a meal. 01/03/16   Max Sane, MD  polyethylene glycol (MIRALAX / GLYCOLAX) packet Take 17 g by mouth daily as needed.    Historical Provider, MD  senna-docusate (SENOKOT-S) 8.6-50 MG tablet Take 2 tablets by mouth daily.     Historical Provider, MD  tetrahydrozoline 0.05 % ophthalmic solution Place 1 drop into both eyes daily as needed.    Historical Provider, MD  venlafaxine (EFFEXOR) 75 MG tablet Take 75 mg by mouth 2 (two) times daily.    Historical Provider, MD    Allergies  Amlodipine; Oxycodone; Demerol [meperidine]; Lisinopril; and Tape  Family History  Problem Relation Age of Onset  . Aneurysm Mother   . Stroke Mother   . Liver cancer Father     Social History Social History  Substance Use Topics  . Smoking status: Former Smoker    Packs/day: 1.50    Years: 18.00    Quit date: 06/25/1975  . Smokeless tobacco: Never Used  . Alcohol use No    Review of Systems Constitutional: No fever/chills Eyes: No visual changes. ENT: No sore throat. Cardiovascular: Denies chest pain. Respiratory: As above Gastrointestinal: No abdominal pain.  No nausea, no vomiting.  No diarrhea.  No constipation. Genitourinary: Negative for dysuria. Musculoskeletal: Negative for back pain. Skin: Negative for rash. Neurological: Negative for headaches, focal weakness or numbness.  10-point ROS otherwise negative.  ____________________________________________   PHYSICAL EXAM:  VITAL SIGNS: ED Triage Vitals  Enc Vitals Group     BP      Pulse      Resp      Temp      Temp src      SpO2      Weight      Height      Head Circumference      Peak Flow      Pain Score       Pain Loc      Pain Edu?      Excl. in [place]?     Constitutional: Alert and oriented. Well appearing and in no acute distress. Eyes: Conjunctivae are normal. PERRL. EOMI. Head: Atraumatic. Nose: No congestion/rhinnorhea. Mouth/Throat: Mucous membranes are moist.   Neck: No stridor.   Cardiovascular: Normal rate, regular rhythm. Grossly normal heart sounds.  Good peripheral circulation. Respiratory: Mildly tachypneic without increased respiratory effort and speaking in full sentences. The patient has decreased air movement throughout with rales to the bases bilaterally as well as wheezing throughout. Gastrointestinal: Soft and nontender. Difficult to assess edema to the abdomen as the patient is also obese. No CVA tenderness. Musculoskeletal: Bilateral lower extremity edema with the patient wearing an orthopedic boot to the left lower extremity extending to just distal to left knee. Also with mild bilateral upper extremity edema. The patient is neurovascularly intact to the toes with full range of motion as well as good coloration and sensation that is intact to light touch. Neurologic:  Normal speech and language. No gross focal neurologic deficits are appreciated. No gait instability. Skin:  Skin is warm, dry and intact. No rash noted. Psychiatric: Mood and affect are normal. Speech and behavior are normal.  ____________________________________________   LABS (all labs ordered are listed, but only abnormal results are displayed)  Labs Reviewed  CBC WITH DIFFERENTIAL/PLATELET - Abnormal; Notable for the following:       Result Value   RBC 2.90 (*)    Hemoglobin 8.6 (*)    HCT 27.6 (*)    MCHC 31.3 (*)    RDW 19.6 (*)    Lymphs Abs 0.8 (*)    All other components within normal limits  BRAIN NATRIURETIC PEPTIDE - Abnormal; Notable for the following:    B Natriuretic Peptide 397.0 (*)    All other components within normal limits  BASIC METABOLIC PANEL - Abnormal; Notable for the  following:    Potassium 5.4 (*)    CO2 33 (*)    Glucose, Bld 60 (*)    BUN 64 (*)    Creatinine, Ser 2.46 (*)  Calcium 8.5 (*)    GFR calc non Af Amer 17 (*)    GFR calc Af Amer 20 (*)    Anion gap 3 (*)    All other components within normal limits  TROPONIN I - Abnormal; Notable for the following:    Troponin I 0.14 (*)    All other components within normal limits  URINALYSIS COMPLETEWITH MICROSCOPIC ([facility] ONLY) - Abnormal; Notable for the following:    Color, Urine YELLOW (*)    APPearance CLOUDY (*)    Bacteria, UA RARE (*)    Squamous Epithelial / LPF 6-30 (*)    All other components within normal limits  TSH   ____________________________________________  EKG  ED ECG REPORT I, Doran Stabler, the attending physician, personally viewed and interpreted this ECG.   Date: 01/27/2016  EKG Time: 1124  Rate: 80  Rhythm: atrial fibrillation, rate 80  Axis: Normal axis  Intervals:Incomplete right bundle branch block with a left anterior fascicular block.  ST&T Change: No ST segment elevation or depression. No abnormal T-wave inversion.  ____________________________________________  RADIOLOGY  CLINICAL DATA:  Pt came to ED from [facility]. Pt in rehab for broken ankle. Pt has had sob for the past week, increased confusion, and weight gain 10lbs overnight. Per chart, h/o copd, chf  EXAM: CHEST 1 VIEW  COMPARISON:  7217  FINDINGS: Enlarged cardiac silhouette. There is central venous congestion similar prior. Mild interstitial edema. No focal infiltrate. No pneumothorax.  IMPRESSION: Cardiomegaly and interstitial edema suggests congestive heart failure. No significant change.   Electronically Signed   By: Suzy Bouchard M.D.   On: 01/27/2016 12:47 ____________________________________________   PROCEDURES  Procedure(s) performed:   Procedures  Critical Care performed:   ____________________________________________   INITIAL  IMPRESSION / ASSESSMENT AND PLAN / ED COURSE  Pertinent labs & imaging results that were available during my care of the patient were reviewed by me and considered in my medical decision making (see chart for details).  ----------------------------------------- 1:34 PM on 01/27/2016 -----------------------------------------  Patient is resting comfortably without any respiratory distress at this time. She is in the high 90s on nasal cannula oxygen at 4 L. On auscultation she has minimal wheezing throughout and is speaking in full sentences. Still with rales to the mid fields bilaterally. Troponin as well as kidney function appears to be at baseline. However, the patient had hypotension as well as hypoxia upon arrival and I believe that she would benefit from a gentle diuresis via IV. The plan for admission was splinted the patient as well as the family. Signed out to Dr. Edwina Barth. Hyperkalemia likely also treated with the Lasix.  Clinical Course     ____________________________________________   FINAL CLINICAL IMPRESSION(S) / ED DIAGNOSES  Dyspnea. COPD. CHF. Hyperkalemia. Peripheral edema.    NEW MEDICATIONS STARTED DURING THIS VISIT:  New Prescriptions   No medications on file     Note:  This document was prepared using Dragon voice recognition software and may include unintentional dictation errors.    Orbie Pyo, MD 01/27/16 1336

## 2016-01-27 NOTE — ED Notes (Signed)
NO USE IN PTS RIGHT ARM DUE TO BLOCKAGE.

## 2016-01-27 NOTE — ED Triage Notes (Signed)
Pt came to ED from [facility]. Pt in rehab for broken ankle. Pt has had sob for the past week, increased confusion, and weight gain 10lbs overnight.

## 2016-01-27 NOTE — ED Notes (Signed)
Troponin .14. MD notified.

## 2016-01-28 ENCOUNTER — Inpatient Hospital Stay: Payer: Medicare Other

## 2016-01-28 LAB — HEPATIC FUNCTION PANEL
ALBUMIN: 3.1 g/dL — AB (ref 3.5–5.0)
ALT: 21 U/L (ref 14–54)
AST: 23 U/L (ref 15–41)
Alkaline Phosphatase: 93 U/L (ref 38–126)
BILIRUBIN DIRECT: 0.2 mg/dL (ref 0.1–0.5)
BILIRUBIN TOTAL: 0.8 mg/dL (ref 0.3–1.2)
Indirect Bilirubin: 0.6 mg/dL (ref 0.3–0.9)
Total Protein: 6.7 g/dL (ref 6.5–8.1)

## 2016-01-28 LAB — BASIC METABOLIC PANEL
ANION GAP: 8 (ref 5–15)
BUN: 68 mg/dL — AB (ref 6–20)
CALCIUM: 8.8 mg/dL — AB (ref 8.9–10.3)
CO2: 29 mmol/L (ref 22–32)
CREATININE: 2.55 mg/dL — AB (ref 0.44–1.00)
Chloride: 104 mmol/L (ref 101–111)
GFR calc Af Amer: 19 mL/min — ABNORMAL LOW (ref >60)
GFR, EST NON AFRICAN AMERICAN: 16 mL/min — AB (ref >60)
GLUCOSE: 276 mg/dL — AB (ref 65–99)
Potassium: 5.5 mmol/L — ABNORMAL HIGH (ref 3.5–5.1)
Sodium: 141 mmol/L (ref 135–145)

## 2016-01-28 LAB — GLUCOSE, CAPILLARY
Glucose-Capillary: 241 mg/dL — ABNORMAL HIGH (ref 65–99)
Glucose-Capillary: 233 mg/dL — ABNORMAL HIGH (ref 65–99)
Glucose-Capillary: 169 mg/dL — ABNORMAL HIGH (ref 65–99)
Glucose-Capillary: 154 mg/dL — ABNORMAL HIGH (ref 65–99)

## 2016-01-28 LAB — TROPONIN I
TROPONIN I: 0.08 ng/mL — AB (ref <0.03)
Troponin I: 0.08 ng/mL (ref <0.03)

## 2016-01-28 MED ORDER — ASPIRIN EC 81 MG PO TBEC
81.0000 mg | DELAYED_RELEASE_TABLET | Freq: Every day | ORAL | Status: DC
  Administered 2016-01-28 – 2016-02-08 (×12): 81 mg via ORAL
  Filled 2016-01-28 (×12): qty 1

## 2016-01-28 MED ORDER — FUROSEMIDE 10 MG/ML IJ SOLN: 20.0000 mg | Freq: Two times a day (BID) | INTRAMUSCULAR | Status: DC

## 2016-01-28 MED ORDER — SODIUM CHLORIDE 0.9 % IV SOLN
INTRAVENOUS | Status: DC
  Administered 2016-01-28 – 2016-01-29 (×2): via INTRAVENOUS

## 2016-01-28 NOTE — Care Management Important Message (Signed)
Important Message  Patient Details  Name: Jill York MRN: [id number] Date of Birth: 1931/01/01   Medicare Important Message Given:  Yes    Makeshia Seat A, RN 01/28/2016, 3:50 PM

## 2016-01-28 NOTE — Progress Notes (Signed)
PT Cancellation Note  Patient Details Name: MONICE MIRANTE MRN: [id number] DOB: 30-Oct-1930   Cancelled Treatment:    Reason Eval/Treat Not Completed: Patient's level of consciousness Entered pt room and family reports that pt is probably not ready for PT today as she "has been in and out."   When pt was able to open eyes she did not look at/make eye contact with PT and ultimately did not appear able to participate.  Kreg Shropshire 01/28/2016, 2:23 PM

## 2016-01-28 NOTE — Progress Notes (Signed)
Patient removed IV. Attempted three times for new IV. Unsuccessful due to edema. Md notified. Acknowledged.

## 2016-01-28 NOTE — Progress Notes (Signed)
Pt remains confused to time place and situation. Very edematous extremities. Attempt to restqart iv was not successful. picc line to be placed by [place] [facility] this evening. Pt incontinent. Family at bedside. Resting quietly at present.

## 2016-01-28 NOTE — Progress Notes (Addendum)
Patient admitted from [facility] where she was recently placed less than a month ago.  Please see full assessment completed on 7/23 by SW in ED from recent admission: 7/23.  No changes to assessment. Patient will return to [facility] [place] for continued rehab for broken ankle once medically cleared. LCSW has updated FL2 and placed in patient's chart for MD to sign. Patient's daughter Jill York reports she is on vacation in [place], but her sister in law is available locally staying at patient's home in [place] if needs arise. Dtr updated to plan of care and appreciative.  No family in room at time of follow up assessment.  Jill York (sister in law):  [phone]  Clinical Social Work Assessment  Patient Details  Name: Jill York MRN: [id number] Date of Birth: [birth year]  Date of referral:  01/13/16                         Reason for consult:  Facility Placement                           Permission sought to share information with:  Family Supports, Customer service manager Permission granted to share information::  Yes, Verbal Permission Granted                       Name::     Jill York  Daughter  [phone] Son Stormy Card  from [place]- will coming soon                       Agency::  [facility]                       Relationship::  yes                       Contact Information:  yes  Housing/Transportation Living arrangements for the past 2 months:  [place] of Information:  Patient Patient Interpreter Needed:  None Criminal Activity/Legal Involvement Pertinent to Current Situation/Hospitalization:  No - Comment as needed Significant Relationships:  Adult Children Lives with:  Self Do you feel safe going back to the place where you live?  Yes Need for family participation in patient care:  Yes (Comment)  Care giving concerns: Spoke and met with her daughter and was concerned about her mother received tramadol   Social Worker assessment /  plan: LCSW met with patient and patients daughter Jill York. Patient has been at [facility] for less than 21 days with frequent  Ed visits- with issues. Patient orientation.x4. Patient has diabetes, and broken  Left ankle. Patient requires full assistance with her adls and is on a diabetic diet. Daughter is a great support to her mother and assured her mother they will private pat to ensure her well being. Insurance is Data processing manager care/Medicare  Employment status:  Retired Forensic scientist:   ([place]) PT Recommendations:  [place] / Referral to community resources:  [place]  Patient/Family's Response to care: Great care  Patient/Family's Understanding of and Emotional Response to Diagnosis, Current Treatment, and Prognosis: I understand that I have to continue with PT to get stronger amd knows to ask her nurses and doctor  Emotional Assessment Appearance:    Attitude/Demeanor/Rapport:   (Polite. cooperative) Affect (typically observed):  Accepting, Adaptable, Pleasant Orientation:  Oriented to  Self, Oriented to Place, Oriented to  Time, Oriented to Situation Alcohol / Substance use:  Never Used Psych involvement (Current and /or in the community):  No (Comment)  Discharge Needs  Concerns to be addressed:  No discharge needs identified Readmission within the last 30 days:  No Current discharge risk:  None Barriers to Discharge:      Joana Reamer, LCSW 01/14/2016, 10:56 AM

## 2016-01-28 NOTE — Consult Note (Signed)
[facility]  CONSULT NOTE    Date: 01/28/2016                  Patient Name:  Jill York  MRN: [id number]  DOB: 01-17-1931  Age / Sex: 82 y.o., female         PCP: Sharyne Peach, MD                 Service Requesting Consult: Dr. Fritzi Mandes                 Reason for Consult: Acute renal failure            History of Present Illness: Jill York is a 82 y.o. white female with diabetes mellitus type II, diabetic neuropathy, diabetic retinopathy, hypertension, glaucoma, hyperlipidemia, depression, recent left navicular fracture, who was admitted to [facility] on 01/27/2016 for Hyperkalemia [E87.5] Peripheral edema [R60.9] COPD exacerbation ([place]) [J44.1] Congestive heart failure, unspecified congestive heart failure chronicity, unspecified congestive heart failure type ([place]) [I50.9]  Patient admitted with creatinine 2.55 with acute exacerbation of congestive heart failure with anasarca and pulmonary edema. Given several doses of furosemide which has yielded 1 litre UOP.   Recent admission for tramadol overdose.    Medications: Outpatient medications: Prescriptions Prior to Admission  Medication Sig Dispense Refill Last Dose  . acetaminophen (TYLENOL ARTHRITIS PAIN) 650 MG CR tablet Take 650 mg by mouth every 12 (twelve) hours.   01/26/2016 at Unknown time  . acetaminophen (TYLENOL) 500 MG tablet Take 1,000 mg by mouth every 6 (six) hours as needed.   unknown  . albuterol (PROVENTIL HFA;VENTOLIN HFA) 108 (90 BASE) MCG/ACT inhaler Inhale 1 puff into the lungs every 4 (four) hours as needed for wheezing or shortness of breath.    01/26/2016 at Unknown time  . albuterol (PROVENTIL) (2.5 MG/3ML) 0.083% nebulizer solution Take 2.5 mg by nebulization every 6 (six) hours as needed for wheezing or shortness of breath.   01/26/2016 at Unknown time  . amLODipine (NORVASC) 5 MG tablet Take 5 mg by mouth daily.   01/27/2016 at 0800  . atorvastatin (LIPITOR) 80 MG tablet  Take 80 mg by mouth daily.   01/26/2016 at Unknown time  . baclofen (LIORESAL) 10 MG tablet Take 5 mg by mouth at bedtime as needed for muscle spasms.   unknown  . bisacodyl (DULCOLAX) 5 MG EC tablet Take 5 mg by mouth daily as needed for moderate constipation.   unknown  . calcitRIOL (ROCALTROL) 0.25 MCG capsule Take 0.25 mcg by mouth every Monday, Wednesday, and Friday.   01/26/2016 at Unknown time  . Cholecalciferol 1000 UNITS tablet Take 1,000 Units by mouth daily.   01/26/2016 at Unknown time  . docusate sodium (COLACE) 100 MG capsule Take 100 mg by mouth daily as needed for mild constipation or moderate constipation.    01/25/2016  . esomeprazole (NEXIUM) 20 MG capsule Take 20 mg by mouth daily as needed (for acid reflux).   unknown  . fluticasone (FLONASE) 50 MCG/ACT nasal spray Place 1 spray into both nostrils daily.   01/26/2016 at Unknown time  . Fluticasone-Salmeterol (ADVAIR) 250-50 MCG/DOSE AEPB Inhale 1 puff into the lungs 2 (two) times daily.   01/26/2016 at Unknown time  . furosemide (LASIX) 40 MG tablet Take 40 mg by mouth daily. *Hold if weight is less than 245 lbs.*   01/26/2016 at 0800  . gabapentin (NEURONTIN) 300 MG capsule Take 300 mg by mouth 2 (two)  times daily.   01/26/2016 at Unknown time  . insulin glargine (LANTUS) 100 UNIT/ML injection Inject 54 Units into the skin at bedtime.    01/26/2016 at 2000  . insulin lispro (HUMALOG) 100 UNIT/ML injection Inject 20-26 Units into the skin 3 (three) times daily with meals. Inject 20 units sub-q in morning, inject 26 units sub-q in afternoon, and inject 20 units sub-q in evening   01/26/2016 at Unknown time  . latanoprost (XALATAN) 0.005 % ophthalmic solution Place 1 drop into both eyes at bedtime.   01/26/2016 at Unknown time  . levothyroxine (SYNTHROID, LEVOTHROID) 137 MCG tablet Take 137-274 mcg by mouth See admin instructions. Take 131mg (1 tablet) by mouth daily on Mon, Tues, Wed, Thurs, Sat, and Sun. Take 2776m (2 tabs) by mouth daily on Friday.  (Take with a glass of water at least 30 to 60 minutes before breakfast)   01/27/2016 at Unknown time  . losartan (COZAAR) 25 MG tablet Take 0.5 tablets (12.5 mg total) by mouth daily. 30 tablet 0 01/26/2016 at 0800  . metoprolol succinate (TOPROL-XL) 25 MG 24 hr tablet Take 12.5 mg by mouth daily.   01/26/2016 at 2000  . montelukast (SINGULAIR) 10 MG tablet Take 10 mg by mouth at bedtime.   01/26/2016 at Unknown time  . nitroGLYCERIN (NITROSTAT) 0.4 MG SL tablet Place 0.4 mg under the tongue every 5 (five) minutes as needed for chest pain.   unknown  . pantoprazole (PROTONIX) 40 MG tablet Take 1 tablet (40 mg total) by mouth 2 (two) times daily before a meal. 60 tablet 0 01/26/2016 at Unknown time  . polyethylene glycol (MIRALAX / GLYCOLAX) packet Take 17 g by mouth daily as needed.   01/26/2016 at Unknown time  . senna-docusate (SENOKOT-S) 8.6-50 MG tablet Take 2 tablets by mouth daily.    01/26/2016 at Unknown time  . tetrahydrozoline 0.05 % ophthalmic solution Place 1 drop into both eyes daily as needed.   unknown  . venlafaxine (EFFEXOR) 75 MG tablet Take 75 mg by mouth 2 (two) times daily.   01/26/2016 at Unknown time  . nystatin (MYCOSTATIN) 100000 UNIT/ML suspension Take 5 mLs (500,000 Units total) by mouth 4 (four) times daily. X 13 more days (Patient not taking: Reported on 01/27/2016) 60 mL 0 Not Taking at Unknown time  . ondansetron (ZOFRAN) 4 MG tablet Take 1 tablet (4 mg total) by mouth every 6 (six) hours as needed for nausea. (Patient not taking: Reported on 01/27/2016) 20 tablet 0 Not Taking at Unknown time    Current medications: Current Facility-Administered Medications  Medication Dose Route Frequency Provider Last Rate Last Dose  . 0.9 %  sodium chloride infusion  250 mL Intravenous PRN JoBaxter HireMD      . acetaminophen (TYLENOL) tablet 1,000 mg  1,000 mg Oral Q6H PRN JoBaxter HireMD   1,000 mg at 01/28/16 1005  . albuterol (PROVENTIL) (2.5 MG/3ML) 0.083% nebulizer solution 3 mL  3 mL  Inhalation Q4H PRN JoBaxter HireMD      . aspirin EC tablet 81 mg  81 mg Oral Daily SoFritzi MandesMD      . atorvastatin (LIPITOR) tablet 80 mg  80 mg Oral Daily JoBaxter HireMD   80 mg at 01/28/16 1006  . baclofen (LIORESAL) tablet 5 mg  5 mg Oral QHS PRN JoBaxter HireMD      . bisacodyl (DULCOLAX) EC tablet 5 mg  5 mg Oral Daily PRN JoJenny Reichmann  Ellouise Newer, MD      . Derrill Memo ON 01/29/2016] calcitRIOL (ROCALTROL) capsule 0.25 mcg  0.25 mcg Oral Q M,W,F Baxter Hire, MD      . cholecalciferol (VITAMIN D) tablet 1,000 Units  1,000 Units Oral Daily Baxter Hire, MD   1,000 Units at 01/28/16 1007  . docusate sodium (COLACE) capsule 100 mg  100 mg Oral Daily PRN Baxter Hire, MD      . fluticasone [facility]) 50 MCG/ACT nasal spray 1 spray  1 spray Each Nare Daily Baxter Hire, MD   1 spray at 01/28/16 1008  . gabapentin (NEURONTIN) capsule 300 mg  300 mg Oral BID Baxter Hire, MD   300 mg at 01/28/16 1007  . insulin aspart (novoLOG) injection 0-20 Units  0-20 Units Subcutaneous TID WC Baxter Hire, MD   7 Units at 01/28/16 1204  . insulin aspart (novoLOG) injection 0-5 Units  0-5 Units Subcutaneous QHS Baxter Hire, MD   2 Units at 01/27/16 2204  . insulin glargine (LANTUS) injection 54 Units  54 Units Subcutaneous QHS Baxter Hire, MD   54 Units at 01/27/16 2205  . latanoprost (XALATAN) 0.005 % ophthalmic solution 1 drop  1 drop Both Eyes QHS Baxter Hire, MD      . levothyroxine (SYNTHROID, LEVOTHROID) tablet 137 mcg  137 mcg Oral Once per day on Sun Mon Tue Wed Thu Sat Pernell Dupre, RPH   137 mcg at 01/28/16 9528   And  . [START ON 02/02/2016] levothyroxine (SYNTHROID, LEVOTHROID) tablet 274 mcg  274 mcg Oral Once per day on Fri Pernell Dupre, RPH      . losartan (COZAAR) tablet 12.5 mg  12.5 mg Oral Daily Baxter Hire, MD   12.5 mg at 01/28/16 1005  . mometasone-formoterol (DULERA) 200-5 MCG/ACT inhaler 2 puff  2 puff Inhalation BID Baxter Hire, MD   2 puff at  01/28/16 0857  . montelukast (SINGULAIR) tablet 10 mg  10 mg Oral QHS Baxter Hire, MD      . naphazoline-glycerin (CLEAR EYES) ophth solution 1 drop  1 drop Both Eyes QID PRN Baxter Hire, MD      . nitroGLYCERIN (NITROSTAT) SL tablet 0.4 mg  0.4 mg Sublingual Q5 min PRN Baxter Hire, MD      . ondansetron [facility]) tablet 4 mg  4 mg Oral Q6H PRN Baxter Hire, MD      . pantoprazole (PROTONIX) EC tablet 40 mg  40 mg Oral BID AC Baxter Hire, MD   40 mg at 01/28/16 0857  . polyethylene glycol (MIRALAX / GLYCOLAX) packet 17 g  17 g Oral Daily Baxter Hire, MD      . senna-docusate (Senokot-S) tablet 2 tablet  2 tablet Oral Daily Baxter Hire, MD   2 tablet at 01/28/16 1006  . sodium chloride flush (NS) 0.9 % injection 3 mL  3 mL Intravenous Q12H Baxter Hire, MD      . sodium chloride flush (NS) 0.9 % injection 3 mL  3 mL Intravenous Q12H Baxter Hire, MD      . sodium chloride flush (NS) 0.9 % injection 3 mL  3 mL Intravenous PRN Baxter Hire, MD      . venlafaxine [facility]) tablet 75 mg  75 mg Oral BID Baxter Hire, MD   75 mg at 01/28/16 1012      Allergies: Allergies  Allergen  Reactions  . Amlodipine Swelling    Leg swelling only   . Oxycodone Other (See Comments)    Pt states she was"out " for 2 hours and didnt notice people in the area   . Demerol [Meperidine] Diarrhea and Nausea And Vomiting  . Lisinopril Cough  . Tape Rash      Past Medical History: Past Medical History:  Diagnosis Date  . Anemia   . Anxiety and depression    [name]  . Arrhythmia    PVC  . CHF (congestive heart failure) ([place])   . Chronic kidney disease   . COPD (chronic obstructive pulmonary disease) ([place])   . Diabetes mellitus without complication ([place])   . Hyperlipidemia   . Hypertension   . Lymphedema   . Lymphoma ([place])   . OSA (obstructive sleep apnea) 2011  . Osteoarthritis   . Thyroid disease      Past Surgical  History: Past Surgical History:  Procedure Laterality Date  . ABDOMINAL HYSTERECTOMY    . APPENDECTOMY    . CARDIAC CATHETERIZATION    . cardiac stents    . CORONARY ANGIOPLASTY     x4  . ESOPHAGOGASTRODUODENOSCOPY (EGD) WITH PROPOFOL N/A 12/27/2015   Procedure: ESOPHAGOGASTRODUODENOSCOPY (EGD) WITH PROPOFOL;  Surgeon: Lollie Sails, MD;  Location: [facility] ENDOSCOPY;  Service: Endoscopy;  Laterality: N/A;  Late afternoon case  . ESOPHAGOGASTRODUODENOSCOPY (EGD) WITH PROPOFOL N/A 01/03/2016   Procedure: ESOPHAGOGASTRODUODENOSCOPY (EGD) WITH PROPOFOL;  Surgeon: Lollie Sails, MD;  Location[facility] ENDOSCOPY;  Service: Endoscopy;  Laterality: N/A;  . KNEE SURGERY Left   . TONSILLECTOMY AND ADENOIDECTOMY       Family History: Family History  Problem Relation Age of Onset  . Aneurysm Mother   . Stroke Mother   . Liver cancer Father      Social History: Social History   Social History  . Marital status: Widowed    Spouse name: N/A  . Number of children: N/A  . Years of education: N/A   Occupational History  . Not on file.   Social History Main Topics  . Smoking status: Former Smoker    Packs/day: 1.50    Years: 18.00    Quit date: 06/25/1975  . Smokeless tobacco: Never Used  . Alcohol use No  . Drug use: No  . Sexual activity: Not on file   Other Topics Concern  . Not on file   Social History Narrative  . No narrative on file     Review of Systems: Review of Systems  Unable to perform ROS: Acuity of condition    Vital Signs: Blood pressure (!) 120/50, pulse 78, temperature 98.5 F (36.9 C), temperature source Oral, resp. rate (!) 21, height 5' 4"  (1.626 m), weight 123 kg (271 lb 3.2 oz), SpO2 100 %.  Weight trends: Filed Weights   01/27/16 1120 01/27/16 1730 01/28/16 0637  Weight: 117.9 kg (260 lb) 125 kg (275 lb 8 oz) 123 kg (271 lb 3.2 oz)    Physical Exam: General: NAD, laying in bed  Head: Dry mucosal membranes  Eyes: Anicteric, PERRL  Neck:  Supple, trachea midline  Lungs:  Clear to auscultation  Heart: Regular rate and rhythm  Abdomen:  Soft, nontender, obese   Extremities: + peripheral edema.  Neurologic: Nonfocal, moving all four extremities  Skin: No lesions        Lab results: Basic Metabolic Panel:  Recent Labs Lab 01/27/16 1116 01/28/16 0610  NA 143  141  K 5.4* 5.5*  CL 107 104  CO2 33* 29  GLUCOSE 60* 276*  BUN 64* 68*  CREATININE 2.46* 2.55*  CALCIUM 8.5* 8.8*    Liver Function Tests: No results for input(s): AST, ALT, ALKPHOS, BILITOT, PROT, ALBUMIN in the last 168 hours. No results for input(s): LIPASE, AMYLASE in the last 168 hours. No results for input(s): AMMONIA in the last 168 hours.  CBC:  Recent Labs Lab 01/27/16 1116  WBC 6.5  NEUTROABS 5.0  HGB 8.6*  HCT 27.6*  MCV 95.2  PLT 155    Cardiac Enzymes:  Recent Labs Lab 01/27/16 1116 01/27/16 1755 01/27/16 2337 01/28/16 0610  TROPONINI 0.14* 0.11* 0.08* 0.08*    BNP: Invalid input(s): POCBNP  CBG:  Recent Labs Lab 01/27/16 1614 01/27/16 1630 01/27/16 2049 01/28/16 0745 01/28/16 1155  GLUCAP 42* 87 248* 22* 233*    Microbiology: Results for orders placed or performed during the hospital encounter of 01/12/16  MRSA PCR Screening     Status: None   Collection Time: 01/12/16  3:24 PM  Result Value Ref Range Status   MRSA by PCR NEGATIVE NEGATIVE Final    Comment:        The GeneXpert MRSA Assay (FDA approved for NASAL specimens only), is one component of a comprehensive MRSA colonization surveillance program. It is not intended to diagnose MRSA infection nor to guide or monitor treatment for MRSA infections.     Coagulation Studies: No results for input(s): LABPROT, INR in the last 72 hours.  Urinalysis:  Recent Labs  01/27/16 1224  COLORURINE YELLOW*  LABSPEC 1.013  PHURINE 5.0  GLUCOSEU NEGATIVE  HGBUR NEGATIVE  BILIRUBINUR NEGATIVE  KETONESUR NEGATIVE  PROTEINUR NEGATIVE  NITRITE  NEGATIVE  LEUKOCYTESUR NEGATIVE      Imaging: Dg Chest 1 View  Result Date: 01/27/2016 CLINICAL DATA:  Pt came to ED from [facility]. Pt in rehab for broken ankle. Pt has had sob for the past week, increased confusion, and weight gain 10lbs overnight. Per chart, h/o copd, chf EXAM: CHEST 1 VIEW COMPARISON:  7217 FINDINGS: Enlarged cardiac silhouette. There is central venous congestion similar prior. Mild interstitial edema. No focal infiltrate. No pneumothorax. IMPRESSION: Cardiomegaly and interstitial edema suggests congestive heart failure. No significant change. Electronically Signed   By: Suzy Bouchard M.D.   On: 01/27/2016 12:47      Assessment & Plan: Jill York is a 82 y.o. white female with diabetes mellitus type II, diabetic neuropathy, diabetic retinopathy, hypertension, glaucoma, hyperlipidemia, depression, recent left navicular fracture, who was admitted to [facility] on 01/27/2016   1. Acute Renal Failure on Chronic kidney disease stage III with proteinuria baseline creatinine of 1.4, eGFR of 34. Acute renal failure secondary to acute exacerbation of congestive heart failure and then overdiuresis. Chronic kidney disease from hypertension and diabetes.  - Hold diuretics.  - Check serum albumin - Give gentle IV fluids NS at 45m/hr - discontinue losartan  2. Hypertension with acute exacerbation of systolic and diastolic congestive heart failure: with anasarca.   3. Secondary Hyperparathyroidism: on calcitriol. PTH 77 from 12/13/15.   4. Diabetes Mellitus type II: with chronic kidney disease: hemoglobin A1c of 8.4%       LOS: 1 Jill York 8/6/20172:50 PM

## 2016-01-28 NOTE — NC FL2 (Signed)
[place] LEVEL OF CARE SCREENING TOOL     IDENTIFICATION  Patient Name: Jill York Birthdate: 1930/08/22 Sex: female Admission Date (Current Location): 01/27/2016  [place] and [place] Number:  Engineering geologist and Address:  [facility], [address] [place], [place] [address]      Provider Number: [id number]  Attending Physician Name and Address:  Max Sane, MD  Relative Name and Phone Number:       Current Level of Care: Hospital Recommended Level of Care: [place] Prior Approval Number:    Date Approved/Denied:   PASRR Number:  [id number]  Discharge Plan: SNF    Current Diagnoses: Patient Active Problem List   Diagnosis Date Noted  . CHF (congestive heart failure) ([place]) 01/27/2016  . Acute encephalopathy 01/12/2016  . Episodes of formed visual hallucinations 12/18/2015  . NSTEMI (non-ST elevated myocardial infarction) ([place]) 12/15/2015  . Acute blood loss anemia   . Hematochezia   . Acute respiratory failure with hypoxia ([place]) 01/01/2015  . Chronic diastolic heart failure ([place]) 11/01/2014    Orientation RESPIRATION BLADDER Height & Weight     Self, Time, Situation, Place  O2 (4L (while in hospital)) Continent Weight: 271 lb 3.2 oz (123 kg) Height:  5\' 4"  (162.6 cm)  BEHAVIORAL SYMPTOMS/MOOD NEUROLOGICAL BOWEL NUTRITION STATUS      Continent Diet (heart healthy, carb modified)  AMBULATORY STATUS COMMUNICATION OF NEEDS Skin   Extensive Assist Verbally Normal                       Personal Care Assistance Level of Assistance  Bathing, Feeding, Dressing Bathing Assistance: Limited assistance Feeding assistance: Independent Dressing Assistance: Limited assistance     Functional Limitations Info  Sight, Hearing, Speech Sight Info: Adequate Hearing Info: Adequate Speech Info: Adequate    SPECIAL CARE FACTORS FREQUENCY  PT (By licensed PT), OT (By licensed OT)     PT Frequency:  5x OT Frequency: 5x            Contractures Contractures Info: Not present    Additional Factors Info  Code Status, Psychotropic, Allergies, Insulin Sliding Scale Code Status Info: DNR Allergies Info: Amlodipine, Oxycodone, Demerol Meperidine, Lisinopril, Tape Psychotropic Info: Effexor Insulin Sliding Scale Info: Novolog and Lantus       Current Medications (01/28/2016):  This is the current hospital active medication list Current Facility-Administered Medications  Medication Dose Route Frequency Provider Last Rate Last Dose  . 0.9 %  sodium chloride infusion  250 mL Intravenous PRN Baxter Hire, MD      . acetaminophen (TYLENOL) tablet 1,000 mg  1,000 mg Oral Q6H PRN Baxter Hire, MD      . albuterol (PROVENTIL) (2.5 MG/3ML) 0.083% nebulizer solution 3 mL  3 mL Inhalation Q4H PRN Baxter Hire, MD      . atorvastatin (LIPITOR) tablet 80 mg  80 mg Oral Daily Baxter Hire, MD      . baclofen (LIORESAL) tablet 5 mg  5 mg Oral QHS PRN Baxter Hire, MD      . bisacodyl (DULCOLAX) EC tablet 5 mg  5 mg Oral Daily PRN Baxter Hire, MD      . Derrill Memo ON 01/29/2016] calcitRIOL (ROCALTROL) capsule 0.25 mcg  0.25 mcg Oral Q M,W,F Baxter Hire, MD      . cholecalciferol (VITAMIN D) tablet 1,000 Units  1,000 Units Oral Daily Baxter Hire, MD      .  docusate sodium (COLACE) capsule 100 mg  100 mg Oral Daily PRN Baxter Hire, MD      . fluticasone [facility]) 50 MCG/ACT nasal spray 1 spray  1 spray Each Nare Daily Baxter Hire, MD      . furosemide (LASIX) injection 40 mg  40 mg Intravenous Q12H Baxter Hire, MD   40 mg at 01/27/16 1903  . gabapentin (NEURONTIN) capsule 300 mg  300 mg Oral BID Baxter Hire, MD      . insulin aspart (novoLOG) injection 0-20 Units  0-20 Units Subcutaneous TID WC Baxter Hire, MD   7 Units at 01/28/16 [phone]  . insulin aspart (novoLOG) injection 0-5 Units  0-5 Units Subcutaneous QHS Baxter Hire, MD   2 Units at 01/27/16 2204  .  insulin glargine (LANTUS) injection 54 Units  54 Units Subcutaneous QHS Baxter Hire, MD   54 Units at 01/27/16 2205  . latanoprost (XALATAN) 0.005 % ophthalmic solution 1 drop  1 drop Both Eyes QHS Baxter Hire, MD      . levothyroxine (SYNTHROID, LEVOTHROID) tablet 137 mcg  137 mcg Oral Once per day on Sun Mon Tue Wed Thu Sat Pernell Dupre, RPH   137 mcg at 01/28/16 [id number]   And  . [START ON 02/02/2016] levothyroxine (SYNTHROID, LEVOTHROID) tablet 274 mcg  274 mcg Oral Once per day on Fri Pernell Dupre, RPH      . losartan (COZAAR) tablet 12.5 mg  12.5 mg Oral Daily Baxter Hire, MD      . mometasone-formoterol [facility]) 200-5 MCG/ACT inhaler 2 puff  2 puff Inhalation BID Baxter Hire, MD   2 puff at 01/28/16 0857  . montelukast (SINGULAIR) tablet 10 mg  10 mg Oral QHS Baxter Hire, MD      . naphazoline-glycerin (CLEAR EYES) ophth solution 1 drop  1 drop Both Eyes QID PRN Baxter Hire, MD      . nitroGLYCERIN (NITROSTAT) SL tablet 0.4 mg  0.4 mg Sublingual Q5 min PRN Baxter Hire, MD      . ondansetron [facility]) tablet 4 mg  4 mg Oral Q6H PRN Baxter Hire, MD      . pantoprazole (PROTONIX) EC tablet 40 mg  40 mg Oral BID AC Baxter Hire, MD   40 mg at 01/28/16 0857  . polyethylene glycol (MIRALAX / GLYCOLAX) packet 17 g  17 g Oral Daily Baxter Hire, MD      . senna-docusate (Senokot-S) tablet 2 tablet  2 tablet Oral Daily Baxter Hire, MD      . sodium chloride flush (NS) 0.9 % injection 3 mL  3 mL Intravenous Q12H Baxter Hire, MD      . sodium chloride flush (NS) 0.9 % injection 3 mL  3 mL Intravenous Q12H Baxter Hire, MD      . sodium chloride flush (NS) 0.9 % injection 3 mL  3 mL Intravenous PRN Baxter Hire, MD      . venlafaxine [facility]) tablet 75 mg  75 mg Oral BID Baxter Hire, MD         Discharge Medications: Please see discharge summary for a list of discharge medications.  Relevant Imaging Results:  Relevant Lab  Results:   Additional Information SSN:  [id number]  [name], [place]

## 2016-01-29 ENCOUNTER — Telehealth: Payer: Self-pay

## 2016-01-29 ENCOUNTER — Inpatient Hospital Stay: Payer: Medicare Other

## 2016-01-29 LAB — RENAL FUNCTION PANEL
ALBUMIN: 3.2 g/dL — AB (ref 3.5–5.0)
Anion gap: 8 (ref 5–15)
BUN: 72 mg/dL — AB (ref 6–20)
CO2: 27 mmol/L (ref 22–32)
CREATININE: 2.6 mg/dL — AB (ref 0.44–1.00)
Calcium: 8.6 mg/dL — ABNORMAL LOW (ref 8.9–10.3)
Chloride: 109 mmol/L (ref 101–111)
GFR calc Af Amer: 18 mL/min — ABNORMAL LOW (ref >60)
GFR, EST NON AFRICAN AMERICAN: 16 mL/min — AB (ref >60)
GLUCOSE: 134 mg/dL — AB (ref 65–99)
PHOSPHORUS: 5.2 mg/dL — AB (ref 2.5–4.6)
POTASSIUM: 5.8 mmol/L — AB (ref 3.5–5.1)
SODIUM: 144 mmol/L (ref 135–145)

## 2016-01-29 LAB — TROPONIN I
Troponin I: 0.12 ng/mL (ref <0.03)
Troponin I: 0.08 ng/mL (ref <0.03)

## 2016-01-29 LAB — GLUCOSE, CAPILLARY
GLUCOSE-CAPILLARY: 118 mg/dL — AB (ref 65–99)
GLUCOSE-CAPILLARY: 197 mg/dL — AB (ref 65–99)
Glucose-Capillary: 226 mg/dL — ABNORMAL HIGH (ref 65–99)
Glucose-Capillary: 204 mg/dL — ABNORMAL HIGH (ref 65–99)

## 2016-01-29 LAB — POTASSIUM: Potassium: 4.8 mmol/L (ref 3.5–5.1)

## 2016-01-29 MED ORDER — SODIUM POLYSTYRENE SULFONATE 15 GM/60ML PO SUSP
30.0000 g | Freq: Once | ORAL | Status: AC
Start: 2016-01-29 — End: 2016-01-29
  Administered 2016-01-29: 30 g via ORAL
  Filled 2016-01-29: qty 120

## 2016-01-29 MED ORDER — FUROSEMIDE 10 MG/ML IJ SOLN
6.0000 mg/h | INTRAVENOUS | Status: DC
  Administered 2016-01-29 – 2016-02-03 (×3): 4 mg/h via INTRAVENOUS
  Filled 2016-01-29 (×4): qty 25

## 2016-01-29 MED ORDER — ACETAMINOPHEN 325 MG PO TABS
650.0000 mg | ORAL_TABLET | Freq: Four times a day (QID) | ORAL | Status: DC | PRN
Start: 2016-01-29 — End: 2016-02-08
  Administered 2016-01-29: 650 mg via ORAL
  Filled 2016-01-29: qty 2

## 2016-01-29 NOTE — Progress Notes (Signed)
[place] [facility] [place] NAME: Jill York    MR#:  [id number]  DATE OF BIRTH:  01/31/1931  SUBJECTIVE: Admitted for shortness of breath   CHIEF COMPLAINT:   Chief Complaint  Patient presents with  . Respiratory Distress  Pleasantly confused, very edematous, nursing unable to get a good peripheral IV REVIEW OF SYSTEMS:   Review of Systems  Constitutional: Positive for malaise/fatigue. Negative for chills, fever and weight loss.  HENT: Negative for nosebleeds and sore throat.   Eyes: Negative for blurred vision.  Respiratory: Positive for shortness of breath. Negative for cough and wheezing.   Cardiovascular: Positive for leg swelling and PND. Negative for chest pain and orthopnea.  Gastrointestinal: Negative for abdominal pain, constipation, diarrhea, heartburn, nausea and vomiting.  Genitourinary: Negative for dysuria and urgency.  Musculoskeletal: Negative for back pain.  Skin: Negative for rash.  Neurological: Positive for weakness. Negative for dizziness, speech change, focal weakness and headaches.  Endo/Heme/Allergies: Does not bruise/bleed easily.  Psychiatric/Behavioral: Positive for memory loss. Negative for depression.    DRUG ALLERGIES:   Allergies  Allergen Reactions  . Amlodipine Swelling    Leg swelling only   . Oxycodone Other (See Comments)    Pt states she was"out " for 2 hours and didnt notice people in the area   . Demerol [Meperidine] Diarrhea and Nausea And Vomiting  . Lisinopril Cough  . Tape Rash    VITALS:  Blood pressure (!) 113/46, pulse 88, temperature 98.6 F (37 C), resp. rate 18, height _0  (1.626 m), weight 123 kg (271 lb 3.2 oz), SpO2 99 %. PHYSICAL EXAMINATION:  GENERAL:  82 y.o.-year-old patient lying in the bed with no acute distress.  EYES: Pupils equal, round, reactive to light and accommodation. No scleral icterus. Extraocular muscles intact.  HEENT: Head atraumatic,  normocephalic. Oropharynx and nasopharynx clear.  NECK:  Supple, no jugular venous distention. No thyroid enlargement, no tenderness.  LUNGS: Normal breath sounds bilaterally, no wheezing, rales,rhonchi or crepitation. No use of accessory muscles of respiration.  CARDIOVASCULAR: S1, S2 normal. No murmurs, rubs, or gallops.  ABDOMEN: Soft, nontender, nondistended. Bowel sounds present. No organomegaly or mass.  EXTREMITIES: significant pedal edema +, no cyanosis, or clubbing.  Generalized anasarca + NEUROLOGIC: Cranial nerves II through XII are intact. Muscle strength 5/5 in all extremities. Sensation intact. Gait not checked.  PSYCHIATRIC: The patient is alert and oriented x 3.  SKIN: No obvious rash, lesion, or ulcer.  LABORATORY PANEL:   CBC  Recent Labs Lab 01/27/16 1116  WBC 6.5  HGB 8.6*  HCT 27.6*  PLT 155   ------------------------------------------------------------------------------------------------------------------  Chemistries   Recent Labs Lab 01/28/16 0610 01/29/16 0650  NA 141 144  K 5.5* 5.8*  CL 104 109  CO2 29 27  GLUCOSE 276* 134*  BUN 68* 72*  CREATININE 2.55* 2.60*  CALCIUM 8.8* 8.6*  AST 23  --   ALT 21  --   ALKPHOS 93  --   BILITOT 0.8  --    ------------------------------------------------------------------------------------------------------------------  Cardiac Enzymes  Recent Labs Lab 01/29/16 0746  TROPONINI 0.12*   ------------------------------------------------------------------------------------------------------------------  RADIOLOGY:  Dg Chest 1 View  Result Date: 01/27/2016 CLINICAL DATA:  Pt came to ED from [facility]. Pt in rehab for broken ankle. Pt has had sob for the past week, increased confusion, and weight gain 10lbs overnight. Per chart, h/o copd, chf EXAM: CHEST 1 VIEW COMPARISON:  7217 FINDINGS: Enlarged cardiac silhouette. There  is central venous congestion similar prior. Mild interstitial edema. No focal  infiltrate. No pneumothorax. IMPRESSION: Cardiomegaly and interstitial edema suggests congestive heart failure. No significant change. Electronically Signed   By: Suzy Bouchard M.D.   On: 01/27/2016 12:47   EKG:   Orders placed or performed during the hospital encounter of 01/27/16  . EKG 12-Lead  . EKG 12-Lead    ASSESSMENT AND PLAN:  [name] is a 82 y.o. white female with diabetes mellitus type II, diabetic neuropathy, diabetic retinopathy, hypertension, glaucoma, hyperlipidemia, depression, recent left navicular fracture, who was admitted to [facility] on 01/27/2016 for worsening shortness of breath  * Generalized anasarca - Likely due to cardiorenal syndrome - Unable to give diuretics as kidney function getting worse  * Acute Renal Failure on Chronic kidney disease stage III with proteinuria baseline creatinine of 1.4, eGFR of 34 - Could be due to cardiorenal syndrome, nephrology following.  We will consult cardiology - Hold diuretics.  - Give gentle IV fluids NS at 82m/hr once have a stable intravenous line - discontinued losartan - Creatinine at 2.6 today  * Hyper kalemia - Likely due to worsening renal failure - We will order 1 dose of Kayexalate and recheck potassium  * Acute exacerbation of systolic and diastolic congestive heart failure: with anasarca.  - Initial chest x-ray showed pulmonary edema - Previous echo in July 2016 showed EF of 45-50%  * Diabetes Mellitus type II: with chronic kidney disease: hemoglobin A1c of 8.4%  * Secondary Hyperparathyroidism: on calcitriol. PTH 77 from 12/13/15.    All the records are reviewed and case discussed with Care Management/Social Worker. Management plans discussed with the patient, family (talked with her daughter-in-law and left message to her daughter [email or web address]@ [phone] and they are in agreement.  CODE STATUS: DNR  TOTAL TIME TAKING CARE OF THIS PATIENT: 372mutes.   POSSIBLE D/C IN 2-3 DAYS, DEPENDING ON  CLINICAL CONDITION. Along with Cardiac and renal evaluation   SHAscension St Francis HospitalVIPUL M.D on 01/29/2016 at 8:29 AM  Between 7am to 6pm - Pager - [phone]  After 6pm go to [email or web address] - password [email or web address]  Office  [phone]: Primary care physician; GeSharyne PeachMD   Note: This dictation was prepared with Dragon dictation along with smaller phrase technology. Any transcriptional errors that result from this process are unintentional.

## 2016-01-29 NOTE — Consult Note (Signed)
[facility] Consultation Note  Patient ID: Jill York, MRN: [id number], DOB/AGE: 27-Nov-1930 82 y.o. Admit date: 01/27/2016   Date of Consult: 01/29/2016 Primary Physician: Sharyne Peach, MD Primary [place]  Chief Complaint:  Chief Complaint  Patient presents with  . Respiratory Distress   Reason for Consult: elevated troponin with this heart failure  HPI: 82 y.o. female with known significant sleep apnea hyperlipidemia essential hypertension diabetes with complications of the significant acute on chronic Kidney disease with exacerbation of acute on chronic diastolic dysfunction heart failure and fluid overload. The patient did have some hypoxia recently most consistent with this issue. She also has atrial fibrillation for which the patient has had previous concerns of heart rate control but currently heart rate control is reasonable. There has been anticoagulation with atrial fibrillation in the recent past for which the patient had some bleeding complications with no apparent source by upper endoscopy and colonoscopy. She has been anemic and therefore there has been no reasons for additional anticoagulation at this time. Diabetes has been relatively reasonably controlled on insulin injection and she has high intensity cholesterol therapy with atorvastatin due to concerns of peripheral vascular disease. The patient has had worsening shortness of breath and lower extremity edema requiring additional hospitalization for this issue although diuretics have been discontinued due to.. Acute on chronic kidney disease. There is been minimal elevation of troponin of 0.16 more consistent with demand ischemia rather than acute coronary syndrome. Patient does feel somewhat better today  Past Medical History:  Diagnosis Date  . Anemia   . Anxiety and depression    [name]  . Arrhythmia    PVC  . CHF (congestive heart failure) ([place])   . Chronic  kidney disease   . COPD (chronic obstructive pulmonary disease) ([place])   . Diabetes mellitus without complication ([place])   . Hyperlipidemia   . Hypertension   . Lymphedema   . Lymphoma ([place])   . OSA (obstructive sleep apnea) 2011  . Osteoarthritis   . Thyroid disease       Surgical History:  Past Surgical History:  Procedure Laterality Date  . ABDOMINAL HYSTERECTOMY    . APPENDECTOMY    . CARDIAC CATHETERIZATION    . cardiac stents    . CORONARY ANGIOPLASTY     x4  . ESOPHAGOGASTRODUODENOSCOPY (EGD) WITH PROPOFOL N/A 12/27/2015   Procedure: ESOPHAGOGASTRODUODENOSCOPY (EGD) WITH PROPOFOL;  Surgeon: Lollie Sails, MD;  Location: [facility] ENDOSCOPY;  Service: Endoscopy;  Laterality: N/A;  Late afternoon case  . ESOPHAGOGASTRODUODENOSCOPY (EGD) WITH PROPOFOL N/A 01/03/2016   Procedure: ESOPHAGOGASTRODUODENOSCOPY (EGD) WITH PROPOFOL;  Surgeon: Lollie Sails, MD;  Location: [facility] ENDOSCOPY;  Service: Endoscopy;  Laterality: N/A;  . KNEE SURGERY Left   . TONSILLECTOMY AND ADENOIDECTOMY       Home Meds: Prior to Admission medications   Medication Sig Start Date End Date Taking? Authorizing Provider  acetaminophen (TYLENOL ARTHRITIS PAIN) 650 MG CR tablet Take 650 mg by mouth every 12 (twelve) hours.   Yes Historical Provider, MD  acetaminophen (TYLENOL) 500 MG tablet Take 1,000 mg by mouth every 6 (six) hours as needed.   Yes Historical Provider, MD  albuterol (PROVENTIL HFA;VENTOLIN HFA) 108 (90 BASE) MCG/ACT inhaler Inhale 1 puff into the lungs every 4 (four) hours as needed for wheezing or shortness of breath.    Yes Historical Provider, MD  albuterol (PROVENTIL) (2.5 MG/3ML) 0.083% nebulizer solution Take 2.5 mg by nebulization every 6 (  six) hours as needed for wheezing or shortness of breath.   Yes Historical Provider, MD  amLODipine (NORVASC) 5 MG tablet Take 5 mg by mouth daily.   Yes Historical Provider, MD  atorvastatin (LIPITOR) 80 MG tablet Take 80 mg by mouth daily.   Yes  Historical Provider, MD  baclofen (LIORESAL) 10 MG tablet Take 5 mg by mouth at bedtime as needed for muscle spasms.   Yes Historical Provider, MD  bisacodyl (DULCOLAX) 5 MG EC tablet Take 5 mg by mouth daily as needed for moderate constipation.   Yes Historical Provider, MD  calcitRIOL (ROCALTROL) 0.25 MCG capsule Take 0.25 mcg by mouth every Monday, Wednesday, and Friday.   Yes Historical Provider, MD  Cholecalciferol 1000 UNITS tablet Take 1,000 Units by mouth daily.   Yes Historical Provider, MD  docusate sodium (COLACE) 100 MG capsule Take 100 mg by mouth daily as needed for mild constipation or moderate constipation.    Yes Historical Provider, MD  esomeprazole (NEXIUM) 20 MG capsule Take 20 mg by mouth daily as needed (for acid reflux).   Yes Historical Provider, MD  fluticasone (FLONASE) 50 MCG/ACT nasal spray Place 1 spray into both nostrils daily.   Yes Historical Provider, MD  Fluticasone-Salmeterol (ADVAIR) 250-50 MCG/DOSE AEPB Inhale 1 puff into the lungs 2 (two) times daily.   Yes Historical Provider, MD  furosemide (LASIX) 40 MG tablet Take 40 mg by mouth daily. *Hold if weight is less than 245 lbs.*   Yes Historical Provider, MD  gabapentin (NEURONTIN) 300 MG capsule Take 300 mg by mouth 2 (two) times daily.   Yes Historical Provider, MD  insulin glargine (LANTUS) 100 UNIT/ML injection Inject 54 Units into the skin at bedtime.    Yes Historical Provider, MD  insulin lispro (HUMALOG) 100 UNIT/ML injection Inject 20-26 Units into the skin 3 (three) times daily with meals. Inject 20 units sub-q in morning, inject 26 units sub-q in afternoon, and inject 20 units sub-q in evening   Yes Historical Provider, MD  latanoprost (XALATAN) 0.005 % ophthalmic solution Place 1 drop into both eyes at bedtime.   Yes Historical Provider, MD  levothyroxine (SYNTHROID, LEVOTHROID) 137 MCG tablet Take 137-274 mcg by mouth See admin instructions. Take 134mcg (1 tablet) by mouth daily on Mon, Tues, Wed,  Thurs, Sat, and Sun. Take 242mcg (2 tabs) by mouth daily on Friday. (Take with a glass of water at least 30 to 60 minutes before breakfast)   Yes Historical Provider, MD  losartan (COZAAR) 25 MG tablet Take 0.5 tablets (12.5 mg total) by mouth daily. 01/14/16  Yes Epifanio Lesches, MD  metoprolol succinate (TOPROL-XL) 25 MG 24 hr tablet Take 12.5 mg by mouth daily.   Yes Historical Provider, MD  montelukast (SINGULAIR) 10 MG tablet Take 10 mg by mouth at bedtime.   Yes Historical Provider, MD  nitroGLYCERIN (NITROSTAT) 0.4 MG SL tablet Place 0.4 mg under the tongue every 5 (five) minutes as needed for chest pain.   Yes Historical Provider, MD  pantoprazole (PROTONIX) 40 MG tablet Take 1 tablet (40 mg total) by mouth 2 (two) times daily before a meal. 01/03/16  Yes Vipul Manuella Ghazi, MD  polyethylene glycol (MIRALAX / GLYCOLAX) packet Take 17 g by mouth daily as needed.   Yes Historical Provider, MD  senna-docusate (SENOKOT-S) 8.6-50 MG tablet Take 2 tablets by mouth daily.    Yes Historical Provider, MD  tetrahydrozoline 0.05 % ophthalmic solution Place 1 drop into both eyes daily as needed.  Yes Historical Provider, MD  venlafaxine (EFFEXOR) 75 MG tablet Take 75 mg by mouth 2 (two) times daily.   Yes Historical Provider, MD  nystatin (MYCOSTATIN) 100000 UNIT/ML suspension Take 5 mLs (500,000 Units total) by mouth 4 (four) times daily. X 13 more days Patient not taking: Reported on 01/27/2016 01/04/16   Gladstone Lighter, MD  ondansetron (ZOFRAN) 4 MG tablet Take 1 tablet (4 mg total) by mouth every 6 (six) hours as needed for nausea. Patient not taking: Reported on 01/27/2016 01/14/16   Epifanio Lesches, MD    Inpatient Medications:  . aspirin EC  81 mg Oral Daily  . atorvastatin  80 mg Oral Daily  . calcitRIOL  0.25 mcg Oral Q M,W,F  . cholecalciferol  1,000 Units Oral Daily  . fluticasone  1 spray Each Nare Daily  . gabapentin  300 mg Oral BID  . insulin aspart  0-20 Units Subcutaneous TID WC  .  insulin aspart  0-5 Units Subcutaneous QHS  . insulin glargine  54 Units Subcutaneous QHS  . latanoprost  1 drop Both Eyes QHS  . levothyroxine  137 mcg Oral Once per day on Sun Mon Tue Wed Thu Sat   And  . [START ON 02/02/2016] levothyroxine  274 mcg Oral Once per day on Fri  . mometasone-formoterol  2 puff Inhalation BID  . montelukast  10 mg Oral QHS  . pantoprazole  40 mg Oral BID AC  . polyethylene glycol  17 g Oral Daily  . senna-docusate  2 tablet Oral Daily  . sodium chloride flush  3 mL Intravenous Q12H  . sodium chloride flush  3 mL Intravenous Q12H  . venlafaxine  75 mg Oral BID   . furosemide (LASIX) infusion 4 mg/hr (01/29/16 1253)    Allergies:  Allergies  Allergen Reactions  . Amlodipine Swelling    Leg swelling only   . Oxycodone Other (See Comments)    Pt states she was"out " for 2 hours and didnt notice people in the area   . Demerol [Meperidine] Diarrhea and Nausea And Vomiting  . Lisinopril Cough  . Tape Rash    Social History   Social History  . Marital status: Widowed    Spouse name: N/A  . Number of children: N/A  . Years of education: N/A   Occupational History  . Not on file.   Social History Main Topics  . Smoking status: Former Smoker    Packs/day: 1.50    Years: 18.00    Quit date: 06/25/1975  . Smokeless tobacco: Never Used  . Alcohol use No  . Drug use: No  . Sexual activity: Not on file   Other Topics Concern  . Not on file   Social History Narrative  . No narrative on file     Family History  Problem Relation Age of Onset  . Aneurysm Mother   . Stroke Mother   . Liver cancer Father      Review of Systems Positive forEdema shortness of breath weakness and fatigue Negative for: General:  chills, fever, night sweats or weight changes.  Cardiovascular: PND orthopnea syncope dizziness  Dermatological skin lesions rashes Respiratory: Cough congestion Urologic: Frequent urination urination at night and  hematuria Abdominal: negative for nausea, vomiting, diarrhea, bright red blood per rectum, melena, or hematemesis Neurologic: negative for visual changes, and/or hearing changes  All other systems reviewed and are otherwise negative except as noted above.  Labs:  Recent Labs  01/27/16 1755 01/27/16 2337 01/28/16  0610 01/29/16 0746  TROPONINI 0.11* 0.08* 0.08* 0.12*   Lab Results  Component Value Date   WBC 6.5 01/27/2016   HGB 8.6 (L) 01/27/2016   HCT 27.6 (L) 01/27/2016   MCV 95.2 01/27/2016   PLT 155 01/27/2016    Recent Labs Lab 01/28/16 0610 01/29/16 0650  NA 141 144  K 5.5* 5.8*  CL 104 109  CO2 29 27  BUN 68* 72*  CREATININE 2.55* 2.60*  CALCIUM 8.8* 8.6*  PROT 6.7  --   BILITOT 0.8  --   ALKPHOS 93  --   ALT 21  --   AST 23  --   GLUCOSE 276* 134*   Lab Results  Component Value Date   CHOL 117 01/24/2014   HDL 59 01/24/2014   LDLCALC 49 01/24/2014   TRIG 44 01/24/2014   No results found for: DDIMER  Radiology/Studies:  Dg Chest 1 View  Result Date: 01/27/2016 CLINICAL DATA:  Pt came [facility] ED from [facility]. Pt in rehab for broken ankle. Pt has had sob for the past week, increased confusion, and weight gain 10lbs overnight. Per chart, h/o copd, chf EXAM: CHEST 1 VIEW COMPARISON:  7217 FINDINGS: Enlarged cardiac silhouette. There is central venous congestion similar prior. Mild interstitial edema. No focal infiltrate. No pneumothorax. IMPRESSION: Cardiomegaly and interstitial edema suggests congestive heart failure. No significant change. Electronically Signed   By: Suzy Bouchard M.D.   On: 01/27/2016 12:47   Ct Head Wo Contrast  Result Date: 01/12/2016 CLINICAL DATA:  Altered mental status.  Confused for 2 days. EXAM: CT HEAD WITHOUT CONTRAST TECHNIQUE: Contiguous axial images were obtained from the base of the skull through the vertex without intravenous contrast. COMPARISON:  01/01/2015 FINDINGS: There is no evidence of mass effect, midline  shift, or extra-axial fluid collections. There is no evidence of a space-occupying lesion or intracranial hemorrhage. There is no evidence of a cortical-based area of acute infarction. There is generalized cerebral atrophy. There is periventricular white matter low attenuation likely secondary to microangiopathy. The ventricles and sulci are appropriate for the patient's age. The basal cisterns are patent. Visualized portions of the orbits are unremarkable. The mastoid sinuses are clear. Right ethmoid sinus mucosal thickening. Cerebrovascular atherosclerotic calcifications are noted. The osseous structures are unremarkable. IMPRESSION: 1. No acute intracranial pathology. 2. Chronic microvascular disease and cerebral atrophy. Electronically Signed   By: Kathreen Devoid   On: 01/12/2016 11:12   Dg Chest Port 1 View  Result Date: 01/29/2016 CLINICAL DATA:  Central line placement today. EXAM: PORTABLE CHEST 1 VIEW COMPARISON:  Single view of the chest 01/27/2016. FINDINGS: Right IJ approach central venous catheter is in place with the tip projecting in the mid to lower superior vena cava. No pneumothorax. Cardiomegaly and pulmonary edema persist. Aortic atherosclerosis is noted. IMPRESSION: Tip of right IJ catheter projects in the mid to lower superior vena cava. Negative for pneumothorax. No change in cardiomegaly and interstitial edema. Electronically Signed   By: Inge Rise M.D.   On: 01/29/2016 11:09   Dg Chest Port 1 View  Result Date: 01/12/2016 CLINICAL DATA:  Increasing confusion for 2 days. EXAM: PORTABLE CHEST 1 VIEW COMPARISON:  None. FINDINGS: Diffuse mild bilateral interstitial thickening. No pleural effusion or pneumothorax. No focal consolidation. Stable cardiomegaly. Thoracic aortic atherosclerosis. Moderate osteoarthritis of the right glenohumeral joint. IMPRESSION: Findings concerning for mild CHF. Aortic Atherosclerosis (ICD10-170.0) Electronically Signed   By: Kathreen Devoid   On: 01/12/2016  10:48  EKG: Atrial fibrillation with left anterior fascicular block and controlled heart rate  Weights: Filed Weights   01/27/16 1120 01/27/16 1730 01/28/16 0637  Weight: 260 lb (117.9 kg) 275 lb 8 oz (125 kg) 271 lb 3.2 oz (123 kg)     Physical Exam: Blood pressure 127/74, pulse (!) 103, temperature 97.7 F (36.5 C), temperature source Oral, resp. rate 20, height 5\' 4"  (1.626 m), weight 271 lb 3.2 oz (123 kg), SpO2 94 %. Body mass index is 46.55 kg/m. General: Well developed, well nourished, in no acute distress. Head eyes ears nose throat: Normocephalic, atraumatic, sclera non-icteric, no xanthomas, nares are without discharge. No apparent thyromegaly and/or mass  Lungs: Normal respiratory effort.Few wheezes, no rales, no rhonchi.  Heart:Irregular with normal S1 S2. no murmur gallop, no rub, PMI is normal size and placement, carotid upstroke normal without bruit, jugular venous pressure is normal Abdomen: Soft, non-tender, non-distended with normoactive bowel sounds. No hepatomegaly. No rebound/guarding. No obvious abdominal masses. Abdominal aorta is normal size without bruit Extremities: 1+ edema. no cyanosis, no clubbing, no ulcers  Peripheral : 2+ bilateral upper extremity pulses, 1 + bilateral femoral pulses, 2+ bilateral dorsal pedal pulse Neuro: Alert and oriented. No facial asymmetry. No focal deficit. Moves all extremities spontaneously. Musculoskeletal: Normal muscle tone without kyphosis Psych:  Responds to questions appropriately with a normal affect.    Assessment: 82 year old female with acute on chronic diastolic dysfunction congestive heart failure likely exacerbated by anemia atrial fibrillation and acute on chronic kidney disease with volume overload and hypoxia without evidence of myocardial infarction but does have some demand ischemia with elevated troponin  Plan: 1. Continue high for heart rate control with current illness and add beta blocker if patient  needs better heart rate control with a goal heart rate between 60 and 90 bpm at rest 2. Abstain from anticoagulation due to concerns of recent bleeding complications and side effects of the anticoagulation 3. High intensity cholesterol therapy for risk reduction in stroke 4. No further cardiac diagnostics necessary at this time 5. Further conservative care and supportive care with the diuresis and/or treatment of fluid imbalances due to chronic kidney disease as per nephrology 6. No further intervention of minimal elevation of troponin consistent with demand ischemia and no current evidence of acute coronary syndrome  Signed, Corey Skains M.D. [facility] 01/29/2016, 1:02 PM

## 2016-01-29 NOTE — Progress Notes (Signed)
PT Cancellation Note  Patient Details Name: Jill York MRN: [id number] DOB: 05/27/1931   Cancelled Treatment:    Reason Eval/Treat Not Completed: Other (comment) Orders received, chart reviewed, evaluation defered to later time. Pt is not medically appropriate for PT at this time due to Potassium level of 5.8  Rabab Currington 01/29/2016, 9:32 AM  Burnett Corrente, SPT [phone]

## 2016-01-29 NOTE — Progress Notes (Signed)
Subjective:  Patient originally presented to ED for increasing confusion for 2 days via EMS from [facility] Dx with acute exacerbation of CHF, treated with iv Lasix uop 1100 cc yesterday S Creatinine with minor fluctuation noted to have  high potassium today Care everywhere records show that 2-D echo from 2015 shows mildly enlarged left ventricular size, EF greater than 09%, grade 1 diastolic dysfunction, mild mitral regurgitation, no valvular stenosis Urine analysis is neg for protein  Objective:  Vital signs in last 24 hours:  Temp:  [97.6 F (36.4 C)-98.6 F (37 C)] 97.6 F (36.4 C) (08/07 0819) Pulse Rate:  [57-88] 82 (08/07 0819) Resp:  [18-26] 26 (08/07 0819) BP: (113-120)/(26-50) 116/48 (08/07 0819) SpO2:  [90 %-100 %] 94 % (08/07 0819)  Weight change:  Filed Weights   01/27/16 1120 01/27/16 1730 01/28/16 0637  Weight: 117.9 kg (260 lb) 125 kg (275 lb 8 oz) 123 kg (271 lb 3.2 oz)    Intake/Output:    Intake/Output Summary (Last 24 hours) at 01/29/16 1045 Last data filed at 01/29/16 1015  Gross per 24 hour  Intake              720 ml  Output             1250 ml  Net             -530 ml     Physical Exam: General: No acute distress, lying in the bed   HEENT Anicteric, moist oral mucous membranes   Neck Supple   Pulm/lungs Bilateral crackles at bases, oxygen by nasal cannula   CVS/Heart No rub, regular with ectopic beats   Abdomen:  Distended   Extremities: 2-3+ pitting edema up to thighs and lower abdomen   Neurologic: Alert, oriented   Skin: No acute rashes           Basic Metabolic Panel:   Recent Labs Lab 01/27/16 1116 01/28/16 0610 01/29/16 0650  NA 143 141 144  K 5.4* 5.5* 5.8*  CL 107 104 109  CO2 33* 29 27  GLUCOSE 60* 276* 134*  BUN 64* 68* 72*  CREATININE 2.46* 2.55* 2.60*  CALCIUM 8.5* 8.8* 8.6*  PHOS  --   --  5.2*     CBC:  Recent Labs Lab 01/27/16 1116  WBC 6.5  NEUTROABS 5.0  HGB 8.6*  HCT 27.6*  MCV 95.2  PLT 155       Microbiology:  Recent Results (from the past 720 hour(s))  MRSA PCR Screening     Status: None   Collection Time: 01/12/16  3:24 PM  Result Value Ref Range Status   MRSA by PCR NEGATIVE NEGATIVE Final    Comment:        The GeneXpert MRSA Assay (FDA approved for NASAL specimens only), is one component of a comprehensive MRSA colonization surveillance program. It is not intended to diagnose MRSA infection nor to guide or monitor treatment for MRSA infections.     Coagulation Studies: No results for input(s): LABPROT, INR in the last 72 hours.  Urinalysis:  Recent Labs  01/27/16 1224  COLORURINE YELLOW*  LABSPEC 1.013  PHURINE 5.0  GLUCOSEU NEGATIVE  HGBUR NEGATIVE  BILIRUBINUR NEGATIVE  KETONESUR NEGATIVE  PROTEINUR NEGATIVE  NITRITE NEGATIVE  LEUKOCYTESUR NEGATIVE      Imaging: Dg Chest 1 View  Result Date: 01/27/2016 CLINICAL DATA:  Pt came to ED from [facility]. Pt in rehab for broken ankle. Pt has had sob for  the past week, increased confusion, and weight gain 10lbs overnight. Per chart, h/o copd, chf EXAM: CHEST 1 VIEW COMPARISON:  7217 FINDINGS: Enlarged cardiac silhouette. There is central venous congestion similar prior. Mild interstitial edema. No focal infiltrate. No pneumothorax. IMPRESSION: Cardiomegaly and interstitial edema suggests congestive heart failure. No significant change. Electronically Signed   By: Suzy Bouchard M.D.   On: 01/27/2016 12:47     Medications:   . sodium chloride 50 mL/hr at 01/28/16 1855   . aspirin EC  81 mg Oral Daily  . atorvastatin  80 mg Oral Daily  . calcitRIOL  0.25 mcg Oral Q M,W,F  . cholecalciferol  1,000 Units Oral Daily  . fluticasone  1 spray Each Nare Daily  . gabapentin  300 mg Oral BID  . insulin aspart  0-20 Units Subcutaneous TID WC  . insulin aspart  0-5 Units Subcutaneous QHS  . insulin glargine  54 Units Subcutaneous QHS  . latanoprost  1 drop Both Eyes QHS  . levothyroxine  137  mcg Oral Once per day on Sun Mon Tue Wed Thu Sat   And  . [START ON 02/02/2016] levothyroxine  274 mcg Oral Once per day on Fri  . mometasone-formoterol  2 puff Inhalation BID  . montelukast  10 mg Oral QHS  . pantoprazole  40 mg Oral BID AC  . polyethylene glycol  17 g Oral Daily  . senna-docusate  2 tablet Oral Daily  . sodium chloride flush  3 mL Intravenous Q12H  . sodium chloride flush  3 mL Intravenous Q12H  . sodium polystyrene  30 g Oral Once  . venlafaxine  75 mg Oral BID   sodium chloride, acetaminophen, albuterol, baclofen, bisacodyl, docusate sodium, naphazoline-glycerin, nitroGLYCERIN, ondansetron, sodium chloride flush  Assessment/ Plan:  82 y.o. female with diabetes mellitus type II, diabetic neuropathy, diabetic retinopathy, hypertension, glaucoma, hyperlipidemia, depression, recent left navicular fracture, who was admitted to [facility] on 01/27/2016   1. Acute Renal Failure on Chronic kidney disease stage III. Baseline creatinine of 1.4, eGFR of 34. Acute renal failure secondary to acute exacerbation of congestive heart failure and then overdiuresis. Chronic kidney disease from hypertension and diabetes.   2. Anasarca.  - d/c iv fluids - start lasix drip - avoid hypotension  3.  Diabetes Mellitus type II: with chronic kidney disease: hemoglobin A1c of 8.4% - low salt, carb restricted diet    LOS: 2 Zelma Snead 8/7/201710:45 AM

## 2016-01-29 NOTE — Progress Notes (Signed)
Patient IV infiltrated. MD notified. Will continue to monitor.

## 2016-01-29 NOTE — Telephone Encounter (Signed)
Per chart review tab pt was admitted to [facility] on 01/27/16.

## 2016-01-29 NOTE — Telephone Encounter (Signed)
PLEASE NOTE: All timestamps contained within this report are represented as [place] Standard Time. CONFIDENTIALTY NOTICE: This fax transmission is intended only for the addressee. It contains information that is legally privileged, confidential or otherwise protected from use or disclosure. If you are not the intended recipient, you are strictly prohibited from reviewing, disclosing, copying using or disseminating any of this information or taking any action in reliance on or regarding this information. If you have received this fax in error, please notify us immediately by telephone so that we can arrange for its return to [place]. Phone: [phone], Toll-Free: [phone], Fax: [phone] Page: 1 of 1 Call Id: [id number] [place] Night - Client Nonclinical Telephone Record [place] Night - Client Client Site [place] Physician Viviana Simpler - MD Contact Type Call Who Is Calling Physician / Provider / Hospital Call Type Provider Call Message Only Initial Comment Caller states pt sent to ER and wanted to let on call know pt was in acute digestive heart failure. Additional Comment Patient is Jill York. DOB Jun 02, 1931. Caller is Su Ley, nurse with [facility]. CB # [phone]. States it was urgent that patient go to ER and was also a family request. Please call back with any questions. Call Closed By: Sherilyn Dacosta Transaction Date/Time: 01/27/2016 11:26:25 AM (ET)

## 2016-01-29 NOTE — Care Management (Signed)
This is patient's 4th admission since June 2017 and the third admission from skilled nursing facility where she is under a medicare plan of treatment for ankle fracture.  Patient may benefit from palliative care consult to address frequent admissions.  She does have DNR in place.  may benefit from having palliative care follow her at SNF if not already following.  CSW aware patient is from [facility] and is following

## 2016-01-30 ENCOUNTER — Inpatient Hospital Stay: Payer: Medicare Other

## 2016-01-30 LAB — CBC
HEMATOCRIT: 26.1 % — AB (ref 35.0–47.0)
Hemoglobin: 8.4 g/dL — ABNORMAL LOW (ref 12.0–16.0)
MCH: 29.8 pg (ref 26.0–34.0)
MCHC: 32.2 g/dL (ref 32.0–36.0)
MCV: 92.6 fL (ref 80.0–100.0)
PLATELETS: 141 10*3/uL — AB (ref 150–440)
RBC: 2.82 MIL/uL — AB (ref 3.80–5.20)
RDW: 18.9 % — ABNORMAL HIGH (ref 11.5–14.5)
WBC: 6.3 10*3/uL (ref 3.6–11.0)

## 2016-01-30 LAB — BASIC METABOLIC PANEL
ANION GAP: 3 — AB (ref 5–15)
BUN: 60 mg/dL — ABNORMAL HIGH (ref 6–20)
CALCIUM: 8.5 mg/dL — AB (ref 8.9–10.3)
CO2: 37 mmol/L — AB (ref 22–32)
Chloride: 106 mmol/L (ref 101–111)
Creatinine, Ser: 2.05 mg/dL — ABNORMAL HIGH (ref 0.44–1.00)
GFR, EST AFRICAN AMERICAN: 25 mL/min — AB (ref >60)
GFR, EST NON AFRICAN AMERICAN: 21 mL/min — AB (ref >60)
GLUCOSE: 88 mg/dL (ref 65–99)
POTASSIUM: 3.8 mmol/L (ref 3.5–5.1)
Sodium: 146 mmol/L — ABNORMAL HIGH (ref 135–145)

## 2016-01-30 LAB — TROPONIN I: Troponin I: 0.1 ng/mL (ref <0.03)

## 2016-01-30 LAB — GLUCOSE, CAPILLARY
GLUCOSE-CAPILLARY: 76 mg/dL (ref 65–99)
GLUCOSE-CAPILLARY: 110 mg/dL — AB (ref 65–99)
Glucose-Capillary: 87 mg/dL (ref 65–99)
Glucose-Capillary: 119 mg/dL — ABNORMAL HIGH (ref 65–99)

## 2016-01-30 MED ORDER — SODIUM CHLORIDE 0.9% FLUSH
10.0000 mL | Freq: Two times a day (BID) | INTRAVENOUS | Status: DC
  Administered 2016-01-30 – 2016-02-05 (×12): 10 mL
  Administered 2016-02-06: 20 mL
  Administered 2016-02-06: 30 mL

## 2016-01-30 MED ORDER — SODIUM CHLORIDE 0.9% FLUSH: 10.0000 mL | INTRAVENOUS | Status: DC | PRN

## 2016-01-30 MED ORDER — PNEUMOCOCCAL VAC POLYVALENT 25 MCG/0.5ML IJ INJ
0.5000 mL | INJECTION | INTRAMUSCULAR | Status: DC
  Filled 2016-01-30: qty 0.5

## 2016-01-30 NOTE — Care Management (Signed)
Barrier to discharge- lasix drip

## 2016-01-30 NOTE — Progress Notes (Signed)
Subjective:  Patient originally presented to ED for increasing confusion for 2 days via EMS from [facility] Dx with acute exacerbation of CHF, treated with iv Lasix uop 1400 cc yesterday S Creatinine with minor fluctuation, but improved today   Care everywhere records show that 2-D echo from 2015 shows mildly enlarged left ventricular size, EF greater than 30%, grade 1 diastolic dysfunction, mild mitral regurgitation, no valvular stenosis Urine analysis is neg for protein  Objective:  Vital signs in last 24 hours:  Temp:  [97.6 F (36.4 C)-98.2 F (36.8 C)] 98.2 F (36.8 C) (08/08 1229) Pulse Rate:  [59-143] 90 (08/08 1229) Resp:  [20-22] 22 (08/08 1229) BP: (120-160)/(46-126) 120/59 (08/08 1229) SpO2:  [77 %-100 %] 100 % (08/08 1229)  Weight change:  Filed Weights   01/27/16 1120 01/27/16 1730 01/28/16 0637  Weight: 117.9 kg (260 lb) 125 kg (275 lb 8 oz) 123 kg (271 lb 3.2 oz)    Intake/Output:    Intake/Output Summary (Last 24 hours) at 01/30/16 1814 Last data filed at 01/30/16 1753  Gross per 24 hour  Intake           176.47 ml  Output             1400 ml  Net         -1223.53 ml     Physical Exam: General: No acute distress, lying in the bed   HEENT Anicteric, moist oral mucous membranes   Neck Supple   Pulm/lungs Bilateral crackles at bases, oxygen by nasal cannula   CVS/Heart No rub, regular with ectopic beats   Abdomen:  Distended   Extremities: 2-3+ pitting edema up to thighs and lower abdomen   Neurologic: Alert, oriented   Skin: No acute rashes           Basic Metabolic Panel:   Recent Labs Lab 01/27/16 1116 01/28/16 0610 01/29/16 0650 01/29/16 1234 01/30/16 0653  NA 143 141 144  --  146*  K 5.4* 5.5* 5.8* 4.8 3.8  CL 107 104 109  --  106  CO2 33* 29 27  --  37*  GLUCOSE 60* 276* 134*  --  88  BUN 64* 68* 72*  --  60*  CREATININE 2.46* 2.55* 2.60*  --  2.05*  CALCIUM 8.5* 8.8* 8.6*  --  8.5*  PHOS  --   --  5.2*  --   --       CBC:  Recent Labs Lab 01/27/16 1116 01/30/16 0653  WBC 6.5 6.3  NEUTROABS 5.0  --   HGB 8.6* 8.4*  HCT 27.6* 26.1*  MCV 95.2 92.6  PLT 155 141*      Microbiology:  Recent Results (from the past 720 hour(s))  MRSA PCR Screening     Status: None   Collection Time: 01/12/16  3:24 PM  Result Value Ref Range Status   MRSA by PCR NEGATIVE NEGATIVE Final    Comment:        The GeneXpert MRSA Assay (FDA approved for NASAL specimens only), is one component of a comprehensive MRSA colonization surveillance program. It is not intended to diagnose MRSA infection nor to guide or monitor treatment for MRSA infections.     Coagulation Studies: No results for input(s): LABPROT, INR in the last 72 hours.  Urinalysis: No results for input(s): COLORURINE, LABSPEC, PHURINE, GLUCOSEU, HGBUR, BILIRUBINUR, KETONESUR, PROTEINUR, UROBILINOGEN, NITRITE, LEUKOCYTESUR in the last 72 hours.  Invalid input(s): APPERANCEUR    Imaging: Dg Chest 2  View  Result Date: 01/30/2016 CLINICAL DATA:  Sleep apnea, hyperlipidemia, hypertension, diabetes, chronic renal disease EXAM: CHEST  2 VIEW COMPARISON:  Portable chest x-ray of 01/29/2016 FINDINGS: There is little change in cardiomegaly with probable interstitial edema and basilar volume loss. Lungs are not quite as well aerated. Right central venous line is unchanged in position. IMPRESSION: Poor aeration with little change in probable interstitial edema. Electronically Signed   By: Ivar Drape M.D.   On: 01/30/2016 08:06   Dg Chest Port 1 View  Result Date: 01/29/2016 CLINICAL DATA:  Central line placement today. EXAM: PORTABLE CHEST 1 VIEW COMPARISON:  Single view of the chest 01/27/2016. FINDINGS: Right IJ approach central venous catheter is in place with the tip projecting in the mid to lower superior vena cava. No pneumothorax. Cardiomegaly and pulmonary edema persist. Aortic atherosclerosis is noted. IMPRESSION: Tip of right IJ catheter  projects in the mid to lower superior vena cava. Negative for pneumothorax. No change in cardiomegaly and interstitial edema. Electronically Signed   By: Inge Rise M.D.   On: 01/29/2016 11:09     Medications:   . furosemide (LASIX) infusion 4 mg/hr (01/29/16 1253)   . aspirin EC  81 mg Oral Daily  . atorvastatin  80 mg Oral Daily  . calcitRIOL  0.25 mcg Oral Q M,W,F  . cholecalciferol  1,000 Units Oral Daily  . fluticasone  1 spray Each Nare Daily  . gabapentin  300 mg Oral BID  . insulin aspart  0-20 Units Subcutaneous TID WC  . insulin aspart  0-5 Units Subcutaneous QHS  . insulin glargine  54 Units Subcutaneous QHS  . latanoprost  1 drop Both Eyes QHS  . levothyroxine  137 mcg Oral Once per day on Sun Mon Tue Wed Thu Sat   And  . [START ON 02/02/2016] levothyroxine  274 mcg Oral Once per day on Fri  . mometasone-formoterol  2 puff Inhalation BID  . montelukast  10 mg Oral QHS  . pantoprazole  40 mg Oral BID AC  . [START ON 01/31/2016] pneumococcal 23 valent vaccine  0.5 mL Intramuscular Tomorrow-1000  . polyethylene glycol  17 g Oral Daily  . senna-docusate  2 tablet Oral Daily  . sodium chloride flush  10-40 mL Intracatheter Q12H  . sodium chloride flush  3 mL Intravenous Q12H  . sodium chloride flush  3 mL Intravenous Q12H  . venlafaxine  75 mg Oral BID   sodium chloride, acetaminophen, acetaminophen, albuterol, baclofen, bisacodyl, docusate sodium, naphazoline-glycerin, nitroGLYCERIN, ondansetron, sodium chloride flush, sodium chloride flush  Assessment/ Plan:  82 y.o. female with diabetes mellitus type II, diabetic neuropathy, diabetic retinopathy, hypertension, glaucoma, hyperlipidemia, depression, recent left navicular fracture, who was admitted to [facility] on 01/27/2016   1. Acute Renal Failure on Chronic kidney disease stage III. Baseline creatinine of 1.4, eGFR of 34. Acute renal failure secondary to acute exacerbation of congestive heart failure and then overdiuresis.  Chronic kidney disease from hypertension and diabetes.   2. Anasarca.  - d/c iv fluids - continue lasix drip - avoid hypotension  3.  Diabetes Mellitus type II: with chronic kidney disease: hemoglobin A1c of 8.4% - low salt, carb restricted diet    LOS: 3 Terrin Meddaugh 8/8/20176:14 PM

## 2016-01-30 NOTE — Progress Notes (Signed)
Heart Failure Clinic appointment made on February 14, 2016 at 10:00am. Thank you for the referral.

## 2016-01-30 NOTE — Discharge Instructions (Signed)
Heart Failure Clinic appointment on February 14, 2016 at 10:00am with Darylene Price, [place]. Please call [phone] to reschedule.

## 2016-01-30 NOTE — Progress Notes (Signed)
[facility] [place] NAME: Jill York    MR#:  [id number]  DATE OF BIRTH:  Apr 24, 1931  SUBJECTIVE: Admitted for shortness of breath   CHIEF COMPLAINT:   Chief Complaint  Patient presents with  . Respiratory Distress  on lasix drip, diuresed about 2.2 liters, feels somewhat better but still quite edematous REVIEW OF SYSTEMS:   Review of Systems  Constitutional: Positive for malaise/fatigue. Negative for chills, fever and weight loss.  HENT: Negative for nosebleeds and sore throat.   Eyes: Negative for blurred vision.  Respiratory: Positive for shortness of breath. Negative for cough and wheezing.   Cardiovascular: Positive for leg swelling and PND. Negative for chest pain and orthopnea.  Gastrointestinal: Negative for abdominal pain, constipation, diarrhea, heartburn, nausea and vomiting.  Genitourinary: Negative for dysuria and urgency.  Musculoskeletal: Negative for back pain.  Skin: Negative for rash.  Neurological: Positive for weakness. Negative for dizziness, speech change, focal weakness and headaches.  Endo/Heme/Allergies: Does not bruise/bleed easily.  Psychiatric/Behavioral: Positive for memory loss. Negative for depression.    DRUG ALLERGIES:   Allergies  Allergen Reactions  . Amlodipine Swelling    Leg swelling only   . Oxycodone Other (See Comments)    Pt states she was"out " for 2 hours and didnt notice people in the area   . Demerol [Meperidine] Diarrhea and Nausea And Vomiting  . Lisinopril Cough  . Tape Rash    VITALS:  Blood pressure (!) 120/59, pulse 90, temperature 98.2 F (36.8 C), resp. rate (!) 22, height 5' 4"  (1.626 m), weight 123 kg (271 lb 3.2 oz), SpO2 100 %. PHYSICAL EXAMINATION:  GENERAL:  82 y.o.-year-old patient lying in the bed with no acute distress.  EYES: Pupils equal, round, reactive to light and accommodation. No scleral icterus. Extraocular muscles intact.  HEENT: Head  atraumatic, normocephalic. Oropharynx and nasopharynx clear.  NECK:  Supple, no jugular venous distention. No thyroid enlargement, no tenderness.  LUNGS: Normal breath sounds bilaterally, no wheezing, rales,rhonchi or crepitation. No use of accessory muscles of respiration.  CARDIOVASCULAR: S1, S2 normal. No murmurs, rubs, or gallops.  ABDOMEN: Soft, nontender, nondistended. Bowel sounds present. No organomegaly or mass.  EXTREMITIES: significant pedal edema +, no cyanosis, or clubbing.  Generalized anasarca + NEUROLOGIC: Cranial nerves II through XII are intact. Muscle strength 5/5 in all extremities. Sensation intact. Gait not checked.  PSYCHIATRIC: The patient is alert and oriented x 3.  SKIN: No obvious rash, lesion, or ulcer.  LABORATORY PANEL:   CBC  Recent Labs Lab 01/30/16 0653  WBC 6.3  HGB 8.4*  HCT 26.1*  PLT 141*   ------------------------------------------------------------------------------------------------------------------  Chemistries   Recent Labs Lab 01/28/16 0610  01/30/16 0653  NA 141  < > 146*  K 5.5*  < > 3.8  CL 104  < > 106  CO2 29  < > 37*  GLUCOSE 276*  < > 88  BUN 68*  < > 60*  CREATININE 2.55*  < > 2.05*  CALCIUM 8.8*  < > 8.5*  AST 23  --   --   ALT 21  --   --   ALKPHOS 93  --   --   BILITOT 0.8  --   --   < > = values in this interval not displayed. ------------------------------------------------------------------------------------------------------------------  Cardiac Enzymes  Recent Labs Lab 01/29/16 1647  TROPONINI 0.08*   ------------------------------------------------------------------------------------------------------------------  RADIOLOGY:  Dg Chest 2 View  Result Date: 01/30/2016 CLINICAL  DATA:  Sleep apnea, hyperlipidemia, hypertension, diabetes, chronic renal disease EXAM: CHEST  2 VIEW COMPARISON:  Portable chest x-ray of 01/29/2016 FINDINGS: There is little change in cardiomegaly with probable interstitial edema  and basilar volume loss. Lungs are not quite as well aerated. Right central venous line is unchanged in position. IMPRESSION: Poor aeration with little change in probable interstitial edema. Electronically Signed   By: Ivar Drape M.D.   On: 01/30/2016 08:06   Dg Chest Port 1 View  Result Date: 01/29/2016 CLINICAL DATA:  Central line placement today. EXAM: PORTABLE CHEST 1 VIEW COMPARISON:  Single view of the chest 01/27/2016. FINDINGS: Right IJ approach central venous catheter is in place with the tip projecting in the mid to lower superior vena cava. No pneumothorax. Cardiomegaly and pulmonary edema persist. Aortic atherosclerosis is noted. IMPRESSION: Tip of right IJ catheter projects in the mid to lower superior vena cava. Negative for pneumothorax. No change in cardiomegaly and interstitial edema. Electronically Signed   By: Inge Rise M.D.   On: 01/29/2016 11:09   ASSESSMENT AND PLAN:  [name] is a 82 y.o. white female with diabetes mellitus type II, diabetic neuropathy, diabetic retinopathy, hypertension, glaucoma, hyperlipidemia, depression, recent left navicular fracture, who was admitted to [facility] on 01/27/2016 for worsening shortness of breath  * Generalized anasarca - Likely due to cardiorenal syndrome - improving on lasix drip  * Acute Renal Failure on Chronic kidney disease stage III with proteinuria baseline creatinine of 1.4, eGFR of 34 - Could be due to cardiorenal syndrome, nephrology following.   - Improving on lasix drip - Creatinine at 2.6->2.05 today  * Hyperkalemia - Likely due to worsening renal failure - resolved s/p Kayexalate  * Acute exacerbation of systolic and diastolic congestive heart failure: with anasarca.  - Initial chest x-ray showed pulmonary edema - Previous echo in July 2016 showed EF of 45-50% - cardio following  * Diabetes Mellitus type II: with chronic kidney disease: hemoglobin A1c of 8.4%  * Secondary Hyperparathyroidism: on  calcitriol. PTH 77 from 12/13/15.      All the records are reviewed and case discussed with Care Management/Social Worker. Management plans discussed with the patient, family (discussed with her daughter Kathyrn Drown) and they are in agreement.  CODE STATUS: DNR  TOTAL TIME TAKING CARE OF THIS PATIENT: 27mnutes.   POSSIBLE D/C IN 2-3 DAYS, DEPENDING ON CLINICAL CONDITION.   [facility] Tailynn Armetta M.D on 01/30/2016 at 4:10 PM  Between 7am to 6pm - Pager - [phone]  After 6pm go to [email or web address] - password [email or web address]  Office  [phone] CC: Primary care physician; GSharyne Peach MD   Note: This dictation was prepared with Dragon dictation along with smaller phrase technology. Any transcriptional errors that result from this process are unintentional.

## 2016-01-30 NOTE — Evaluation (Signed)
Physical Therapy Evaluation Patient Details Name: Jill York MRN: [id number] DOB: 10-02-1930 Today's Date: 01/30/2016   History of Present Illness  Pt is an 82 y/o female, who was sent to hospital from [facility], where she was receiving skilled PT following an ankle FX. She was admitted to the hospital due to confusion, hypotension, SOB, and rapid weight gain X 10lb. She is currently being treated for acute on CKD, anasarca, SOB, and other. Pt is PWB w/boot on L LE s/p L ankle Fx 12/19/15. PMH includes coronary angioplasty x4, COPD, CKD, CHF, and DM.  Clinical Impression  Jill York is a pleasant 82 y/o female who presents with severe SOB, anasarca, and very limited activity tolerance. She is max assist with bed mobility and had increase in HR up to 110 and O2 sats 90-95% on 6L O2 with supine-to-sit; transfers and ambulation deferred due to cardiopulmonary limitations and inconsistency. Pt was cooperative as possible and able to follow commands. Pt tolerated sitting on EOB with low level sitting balance activities with single UE support and min assist followed by use of bed pan with min assist X 2 with verbal cuing. Pt is appropriate for skilled PT at this time to address deficits in strength, balance, coordination, endurance, and activity tolerance. She will benefit from continued PT in a SNF.      Follow Up Recommendations SNF    Equipment Recommendations  None recommended by PT    Recommendations for Other Services       Precautions / Restrictions Precautions Precautions: Fall Restrictions Weight Bearing Restrictions: No LLE Weight Bearing: Partial weight bearing LLE Partial Weight Bearing Percentage or Pounds: LF ankle Frax about 5 weeks ago      Mobility  Bed Mobility Overal bed mobility: Needs Assistance Bed Mobility: Supine to Sit     Supine to sit: Max assist;HOB elevated     General bed mobility comments: Pt has adequate strength, cardiopulmonary condition is  primary limiting factor. Requires max assist and verbal/ tactile cuing for LEs off bed and use of bed rails  Transfers                 General transfer comment: Not assessed due to cardiopulmonary status. Inconsistant on pulse-ox   Ambulation/Gait             General Gait Details: Not assessed due to cardiopulmonary status. Inconsistant on pulse-ox   Stairs            Wheelchair Mobility    Modified Rankin (Stroke Patients Only)       Balance Overall balance assessment: Needs assistance Sitting-balance support: Bilateral upper extremity supported;Feet supported Sitting balance-Leahy Scale: Poor Sitting balance - Comments: Able to tolerate sitting on EOB for several minutes with HR 110 declining to 95 and SpO2 WFL. Required min assist for balance                                     Pertinent Vitals/Pain Pain Assessment: Faces Faces Pain Scale: Hurts little more Pain Location: none specified Pain Descriptors / Indicators: Grimacing Pain Intervention(s): Limited activity within patient's tolerance;Monitored during session    Home Living Family/patient expects to be discharged to:: Skilled nursing facility                      Prior Function Level of Independence: Needs assistance  Comments: Limited mobility, pt reported only that she "was doing pretty good over there" (at SNF)     Hand Dominance        Extremity/Trunk Assessment   Upper Extremity Assessment: Generalized weakness (Grossly 4/5)           Lower Extremity Assessment: Generalized weakness (Grossly 4-/5)         Communication   Communication: No difficulties (SOB)  Cognition Arousal/Alertness: Awake/alert Behavior During Therapy: WFL for tasks assessed/performed Overall Cognitive Status: Within Functional Limits for tasks assessed                      General Comments General comments (skin integrity, edema, etc.): general edema  with paresthesia in LEs; reported feeling like she was "wearing stalkings"    Exercises Other Exercises Other Exercises: Therapeutic activity including transfer to and from EOB with verbal and tactile cuing for LEs off EOB and UE placement on bedrails. Pt conducted sitting balance activity X 5 minutes; transfered back to supine and used bed pan with B rolling; required min assist X 2 for rolling and bed pan use      Assessment/Plan    PT Assessment Patient needs continued PT services  PT Diagnosis Difficulty walking;Abnormality of gait;Generalized weakness   PT Problem List Decreased strength;Decreased range of motion;Decreased activity tolerance;Decreased balance;Decreased mobility;Decreased coordination;Decreased cognition;Decreased safety awareness;Cardiopulmonary status limiting activity;Decreased knowledge of precautions;Decreased knowledge of use of DME;Impaired sensation  PT Treatment Interventions Functional mobility training;Therapeutic activities;Therapeutic exercise;Balance training;Neuromuscular re-education;Cognitive remediation   PT Goals (Current goals can be found in the Care Plan section) Acute Rehab PT Goals Patient Stated Goal: get well, return to rehab PT Goal Formulation: With patient Time For Goal Achievement: 02/13/16 Potential to Achieve Goals: Good    Frequency Min 2X/week   Barriers to discharge        Co-evaluation               End of Session Equipment Utilized During Treatment: Oxygen Activity Tolerance: Patient limited by fatigue;Treatment limited secondary to medical complications (Comment) (HR and O2 sats fluctuating symptomatically w/ minor exertion) Patient left: in bed;with call bell/phone within reach;with bed alarm set;with nursing/sitter in room Nurse Communication: Mobility status         Time: [id number] PT Time Calculation (min) (ACUTE ONLY): 30 min   Charges:   PT Evaluation $PT Eval Moderate Complexity: 1 Procedure PT  Treatments $Therapeutic Activity: 8-22 mins   PT G Codes:        Chiron Campione 02/04/16, 1:36 PM Burnett Corrente, SPT [phone]

## 2016-01-30 NOTE — Clinical Social Work Note (Signed)
Patient is from [facility], plan is for her to return back to SNF to continue with her therapy.  Jones Broom. Ladrea Holladay, MSW [phone]  Mon-Fri 8a-4:30p 01/30/2016 10:25 AM

## 2016-01-31 LAB — GLUCOSE, CAPILLARY
GLUCOSE-CAPILLARY: 43 mg/dL — AB (ref 65–99)
GLUCOSE-CAPILLARY: 149 mg/dL — AB (ref 65–99)
GLUCOSE-CAPILLARY: 230 mg/dL — AB (ref 65–99)
GLUCOSE-CAPILLARY: 190 mg/dL — AB (ref 65–99)
Glucose-Capillary: 34 mg/dL — CL (ref 65–99)
Glucose-Capillary: 40 mg/dL — CL (ref 65–99)
Glucose-Capillary: 224 mg/dL — ABNORMAL HIGH (ref 65–99)

## 2016-01-31 MED ORDER — DEXTROSE 50 % IV SOLN
1.0000 | Freq: Once | INTRAVENOUS | Status: AC
  Administered 2016-01-31: 50 mL via INTRAVENOUS
  Filled 2016-01-31: qty 50

## 2016-01-31 MED ORDER — INSULIN GLARGINE 100 UNIT/ML ~~LOC~~ SOLN
43.0000 [IU] | Freq: Every day | SUBCUTANEOUS | Status: DC
  Administered 2016-01-31 – 2016-02-01 (×2): 43 [IU] via SUBCUTANEOUS
  Filled 2016-01-31 (×3): qty 0.43

## 2016-01-31 MED ORDER — ALPRAZOLAM 0.5 MG PO TABS
0.5000 mg | ORAL_TABLET | Freq: Two times a day (BID) | ORAL | Status: DC | PRN
  Administered 2016-01-31 – 2016-02-06 (×7): 0.5 mg via ORAL
  Filled 2016-01-31 (×7): qty 1

## 2016-01-31 MED ORDER — PROMETHAZINE HCL 25 MG/ML IJ SOLN
12.5000 mg | Freq: Four times a day (QID) | INTRAMUSCULAR | Status: DC | PRN
  Administered 2016-01-31 – 2016-02-07 (×5): 12.5 mg via INTRAVENOUS
  Filled 2016-01-31 (×5): qty 1

## 2016-01-31 NOTE — Progress Notes (Signed)
[place] at [place] NAME: Jill York    MR#:  [id number]  DATE OF BIRTH:  1930/11/02  SUBJECTIVE: Admitted for shortness of breath   CHIEF COMPLAINT:   Chief Complaint  Patient presents with  . Respiratory Distress  pt is very anxious today,requesting xanax  on lasix drip, diuresed well  feels better  REVIEW OF SYSTEMS:   Review of Systems  Constitutional: Positive for malaise/fatigue. Negative for chills, fever and weight loss.  HENT: Negative for nosebleeds and sore throat.   Eyes: Negative for blurred vision.  Respiratory: Positive for shortness of breath. Negative for cough and wheezing.   Cardiovascular: Positive for leg swelling and PND. Negative for chest pain and orthopnea.  Gastrointestinal: Negative for abdominal pain, constipation, diarrhea, heartburn, nausea and vomiting.  Genitourinary: Negative for dysuria and urgency.  Musculoskeletal: Negative for back pain.  Skin: Negative for rash.  Neurological: Positive for weakness. Negative for dizziness, speech change, focal weakness and headaches.  Endo/Heme/Allergies: Does not bruise/bleed easily.  Psychiatric/Behavioral: Positive for memory loss. Negative for depression.    DRUG ALLERGIES:   Allergies  Allergen Reactions  . Amlodipine Swelling    Leg swelling only   . Oxycodone Other (See Comments)    Pt states she was"out " for 2 hours and didnt notice people in the area   . Demerol [Meperidine] Diarrhea and Nausea And Vomiting  . Lisinopril Cough  . Tape Rash    VITALS:  Blood pressure (!) 130/52, pulse 85, temperature 98.3 F (36.8 C), resp. rate 18, height 5' 4"  (1.626 m), weight 123 kg (271 lb 3.2 oz), SpO2 92 %. PHYSICAL EXAMINATION:  GENERAL:  82 y.o.-year-old patient lying in the bed with no acute distress.  EYES: Pupils equal, round, reactive to light and accommodation. No scleral icterus. Extraocular muscles intact.  HEENT: Head atraumatic,  normocephalic. Oropharynx and nasopharynx clear.  NECK:  Supple, no jugular venous distention. No thyroid enlargement, no tenderness.  LUNGS: Normal breath sounds bilaterally, no wheezing, rales,rhonchi or crepitation. No use of accessory muscles of respiration.  CARDIOVASCULAR: S1, S2 normal. No murmurs, rubs, or gallops.  ABDOMEN: Soft, nontender, nondistended. Bowel sounds present. No organomegaly or mass.  EXTREMITIES: pedal edema  better, no cyanosis, or clubbing.  Generalized anasarca + NEUROLOGIC: Cranial nerves II through XII are intact. Muscle strength 5/5 in all extremities. Sensation intact. Gait not checked.  PSYCHIATRIC: The patient is alert and oriented x 3.  SKIN: No obvious rash, lesion, or ulcer.  LABORATORY PANEL:   CBC  Recent Labs Lab 01/30/16 0653  WBC 6.3  HGB 8.4*  HCT 26.1*  PLT 141*   ------------------------------------------------------------------------------------------------------------------  Chemistries   Recent Labs Lab 01/28/16 0610  01/30/16 0653  NA 141  < > 146*  K 5.5*  < > 3.8  CL 104  < > 106  CO2 29  < > 37*  GLUCOSE 276*  < > 88  BUN 68*  < > 60*  CREATININE 2.55*  < > 2.05*  CALCIUM 8.8*  < > 8.5*  AST 23  --   --   ALT 21  --   --   ALKPHOS 93  --   --   BILITOT 0.8  --   --   < > = values in this interval not displayed. ------------------------------------------------------------------------------------------------------------------  Cardiac Enzymes  Recent Labs Lab 01/29/16 1647  TROPONINI 0.08*   ------------------------------------------------------------------------------------------------------------------  RADIOLOGY:  Dg Chest 2 View  Result Date: 01/30/2016  CLINICAL DATA:  Sleep apnea, hyperlipidemia, hypertension, diabetes, chronic renal disease EXAM: CHEST  2 VIEW COMPARISON:  Portable chest x-ray of 01/29/2016 FINDINGS: There is little change in cardiomegaly with probable interstitial edema and basilar  volume loss. Lungs are not quite as well aerated. Right central venous line is unchanged in position. IMPRESSION: Poor aeration with little change in probable interstitial edema. Electronically Signed   By: Ivar Drape M.D.   On: 01/30/2016 08:06   ASSESSMENT AND PLAN:  [name] is a 82 y.o. white female with diabetes mellitus type II, diabetic neuropathy, diabetic retinopathy, hypertension, glaucoma, hyperlipidemia, depression, recent left navicular fracture, who was admitted to [facility] on 01/27/2016 for worsening shortness of breath  * Generalized anasarca 2/2 cardio renal syn  -improving on lasix drip Monitor intake and output  * Acute Renal Failure on Chronic kidney disease stage III with proteinuria baseline creatinine of 1.4, eGFR of 34 Cr 2.6 -2.05 - Could be due to cardiorenal syndrome, nephrology following.   - Improving on lasix drip  * Anxiety- xanax  * Hyperkalemia - resolved s/p Kayexalate  * Acute exacerbation of systolic and diastolic congestive heart failure: with anasarca.  - Initial chest x-ray showed pulmonary edema - Previous echo in July 2016 showed EF of 45-50% - cardio following  * Diabetes Mellitus type II: with chronic kidney disease: hemoglobin A1c of 8.4%  * Secondary Hyperparathyroidism: on calcitriol. PTH 77 from 12/13/15.   Gen weakness - PT consult   All the records are reviewed and case discussed with Care Management/Social Worker. Management plans discussed with the patient, family (discussed with her daughter Kathyrn Drown) and they are in agreement.  CODE STATUS: DNR  TOTAL TIME TAKING CARE OF THIS PATIENT: 40mnutes.   POSSIBLE D/C IN 2- DAYS, DEPENDING ON CLINICAL CONDITION.   GNicholes MangoM.D on 01/31/2016 at 8:03 PM  Between 7am to 6pm - Pager - [phone]  After 6pm go to [email or web address] - password [email or web address]  Office  [phone] CC: Primary care physician; GSharyne Peach MD   Note: This  dictation was prepared with Dragon dictation along with smaller phrase technology. Any transcriptional errors that result from this process are unintentional.

## 2016-01-31 NOTE — Progress Notes (Signed)
Inpatient Diabetes Program Recommendations  AACE/ADA: New Consensus Statement on Inpatient Glycemic Control (2015)  Target Ranges:  Prepandial:   less than 140 mg/dL      Peak postprandial:   less than 180 mg/dL (1-2 hours)      Critically ill patients:  140 - 180 mg/dL  Results for Jill York, Jill York (MRN [id number]) as of 01/31/2016 12:05  Ref. Range 01/30/2016 07:42 01/30/2016 11:55 01/30/2016 16:30 01/30/2016 21:33 01/31/2016 07:36 01/31/2016 07:56 01/31/2016 08:13 01/31/2016 08:42  Glucose-Capillary Latest Ref Range: 65 - 99 mg/dL 76 110 (H) 87 119 (H) 40 (LL) 34 (LL) 43 (LL) 149 (H)    Review of Glycemic Control  Diabetes history: DM2 Outpatient Diabetes medications: Lantus 54 units QHS, Humalog 20 units with breakfast, 26 units with lunch, 20 units with supper Current orders for Inpatient glycemic control: Lantus 54 units QHS, Novolog 0-20 units TID with meals, Novolog 0-5 units QHS  Inpatient Diabetes Program Recommendations: Insulin - Basal: Glucose ranged from 76-119 mg/dl on 01/30/16. Fasting glucose 40 mg/dl this morning and patient required hypoglycemia treatment. Please consider decreasing Lantus to 43 units QHS (based on 123 kg x 0.35 units)  Thanks, Barnie Alderman, RN, MSN, CDE Diabetes Coordinator Inpatient Diabetes Program [phone] (Team Pager from [place] to [place]) [phone] ([place] office) [phone] [facility] office) [phone] [facility] office)

## 2016-01-31 NOTE — Progress Notes (Signed)
Subjective:  Patient originally presented to ED for increasing confusion for 2 days via EMS from [facility] Dx with acute exacerbation of CHF, treated with iv Lasix  uop 2300 cc yesterday S Creatinine with minor fluctuation,  No new labs today   Care everywhere records show that 2-D echo from 2015 shows mildly enlarged left ventricular size, EF greater than 54%, grade 1 diastolic dysfunction, mild mitral regurgitation, no valvular stenosis Urine analysis is neg for protein  Objective:  Vital signs in last 24 hours:  Temp:  [97.5 F (36.4 C)-98.4 F (36.9 C)] 97.5 F (36.4 C) (08/09 1203) Pulse Rate:  [80-93] 80 (08/09 1203) Resp:  [18-19] 19 (08/09 1203) BP: (107-139)/(41-74) 132/74 (08/09 1203) SpO2:  [95 %-100 %] 100 % (08/09 1203)  Weight change:  Filed Weights   01/27/16 1120 01/27/16 1730 01/28/16 0637  Weight: 117.9 kg (260 lb) 125 kg (275 lb 8 oz) 123 kg (271 lb 3.2 oz)    Intake/Output:    Intake/Output Summary (Last 24 hours) at 01/31/16 1448 Last data filed at 01/31/16 1427  Gross per 24 hour  Intake              900 ml  Output             2300 ml  Net            -1400 ml     Physical Exam: General: No acute distress, lying in the bed   HEENT Anicteric, moist oral mucous membranes   Neck Supple   Pulm/lungs Bilateral crackles at bases, oxygen by nasal cannula   CVS/Heart No rub, regular with ectopic beats   Abdomen:  Distended   Extremities: 2-3+ pitting edema up to thighs and lower abdomen   Neurologic: Alert, oriented   Skin: No acute rashes           Basic Metabolic Panel:   Recent Labs Lab 01/27/16 1116 01/28/16 0610 01/29/16 0650 01/29/16 1234 01/30/16 0653  NA 143 141 144  --  146*  K 5.4* 5.5* 5.8* 4.8 3.8  CL 107 104 109  --  106  CO2 33* 29 27  --  37*  GLUCOSE 60* 276* 134*  --  88  BUN 64* 68* 72*  --  60*  CREATININE 2.46* 2.55* 2.60*  --  2.05*  CALCIUM 8.5* 8.8* 8.6*  --  8.5*  PHOS  --   --  5.2*  --   --       CBC:  Recent Labs Lab 01/27/16 1116 01/30/16 0653  WBC 6.5 6.3  NEUTROABS 5.0  --   HGB 8.6* 8.4*  HCT 27.6* 26.1*  MCV 95.2 92.6  PLT 155 141*      Microbiology:  Recent Results (from the past 720 hour(s))  MRSA PCR Screening     Status: None   Collection Time: 01/12/16  3:24 PM  Result Value Ref Range Status   MRSA by PCR NEGATIVE NEGATIVE Final    Comment:        The GeneXpert MRSA Assay (FDA approved for NASAL specimens only), is one component of a comprehensive MRSA colonization surveillance program. It is not intended to diagnose MRSA infection nor to guide or monitor treatment for MRSA infections.     Coagulation Studies: No results for input(s): LABPROT, INR in the last 72 hours.  Urinalysis: No results for input(s): COLORURINE, LABSPEC, PHURINE, GLUCOSEU, HGBUR, BILIRUBINUR, KETONESUR, PROTEINUR, UROBILINOGEN, NITRITE, LEUKOCYTESUR in the last 72 hours.  Invalid  input(s): APPERANCEUR    Imaging: Dg Chest 2 View  Result Date: 01/30/2016 CLINICAL DATA:  Sleep apnea, hyperlipidemia, hypertension, diabetes, chronic renal disease EXAM: CHEST  2 VIEW COMPARISON:  Portable chest x-ray of 01/29/2016 FINDINGS: There is little change in cardiomegaly with probable interstitial edema and basilar volume loss. Lungs are not quite as well aerated. Right central venous line is unchanged in position. IMPRESSION: Poor aeration with little change in probable interstitial edema. Electronically Signed   By: Ivar Drape M.D.   On: 01/30/2016 08:06     Medications:   . furosemide (LASIX) infusion 4 mg/hr (01/29/16 1253)   . aspirin EC  81 mg Oral Daily  . atorvastatin  80 mg Oral Daily  . calcitRIOL  0.25 mcg Oral Q M,W,F  . cholecalciferol  1,000 Units Oral Daily  . fluticasone  1 spray Each Nare Daily  . gabapentin  300 mg Oral BID  . insulin aspart  0-20 Units Subcutaneous TID WC  . insulin aspart  0-5 Units Subcutaneous QHS  . insulin glargine  54 Units  Subcutaneous QHS  . latanoprost  1 drop Both Eyes QHS  . levothyroxine  137 mcg Oral Once per day on Sun Mon Tue Wed Thu Sat   And  . [START ON 02/02/2016] levothyroxine  274 mcg Oral Once per day on Fri  . mometasone-formoterol  2 puff Inhalation BID  . montelukast  10 mg Oral QHS  . pantoprazole  40 mg Oral BID AC  . pneumococcal 23 valent vaccine  0.5 mL Intramuscular Tomorrow-1000  . polyethylene glycol  17 g Oral Daily  . senna-docusate  2 tablet Oral Daily  . sodium chloride flush  10-40 mL Intracatheter Q12H  . sodium chloride flush  3 mL Intravenous Q12H  . sodium chloride flush  3 mL Intravenous Q12H  . venlafaxine  75 mg Oral BID   sodium chloride, acetaminophen, acetaminophen, albuterol, ALPRAZolam, baclofen, bisacodyl, docusate sodium, naphazoline-glycerin, nitroGLYCERIN, ondansetron, sodium chloride flush, sodium chloride flush  Assessment/ Plan:  82 y.o. female with diabetes mellitus type II, diabetic neuropathy, diabetic retinopathy, hypertension, glaucoma, hyperlipidemia, depression, recent left navicular fracture, who was admitted to [facility] on 01/27/2016   1. Acute Renal Failure on Chronic kidney disease stage III. Baseline creatinine of 1.4, eGFR of 34. Acute renal failure secondary to acute exacerbation of congestive heart failure and then overdiuresis. Chronic kidney disease from hypertension and diabetes.  S Cr at 2.05 Labs daily  2. Anasarca.  - d/c iv fluids - continue lasix drip - avoid hypotension  3.  Diabetes Mellitus type II: with chronic kidney disease: hemoglobin A1c of 8.4% - low salt, carb restricted diet    LOS: 4 Marten Iles 8/9/20172:48 PM

## 2016-01-31 NOTE — Progress Notes (Signed)
Hypoglycemic Event  CBG: 40  Treatment: 12 fluid ounces juice  Symptoms: lethargy, confusion  Follow-up CBG: Time: [id number] CBG Result: 34  0755 Pt now awake and able to drink 12 more fluid ounces of juice with crackers and peanut butter. D50 ordered and waiting to be delivered.  Follow-up CBG: Time: 0813 CBG Result: 43  0820 1 amp d50 delivered  Follow-up CBG: Time: 0842 CBG Result: 149  Comments/MD notified: Dr. Margaretmary Eddy notified in person in rounds [place], [place]

## 2016-01-31 NOTE — Progress Notes (Signed)
Physical Therapy Treatment Patient Details Name: Jill York MRN: [id number] DOB: 1930/12/12 Today's Date: 2016/02/02    History of Present Illness Pt is an 82 y/o female, who was sent to hospital from [facility], where she was receiving skilled PT following an ankle FX. She was admitted to the hospital due to confusion, hypotension, SOB, and rapid weight gain X 10lb. She is currently being treated for acute on CKD, anasarca, SOB, and other. Pt is PWB w/boot on L LE s/p L ankle Fx 12/19/15. PMH includes coronary angioplasty x4, COPD, CKD, CHF, and DM.    PT Comments    Pt anxious upon arrival.  Encouragement and emotional support given.  CNA in to check vitals and within acceptable limits.  Pt agrees to supine exercises as described below.  Anxiety limiting participation and mobility.  Nurse in for anxiety meds.  Pt very talkative and needed re-direction at times to stay on task.  Self terminates activity.     Follow Up Recommendations  SNF     Equipment Recommendations  None recommended by PT    Recommendations for Other Services       Precautions / Restrictions Precautions Precautions: Fall Restrictions Weight Bearing Restrictions: Yes LLE Weight Bearing: Partial weight bearing LLE Partial Weight Bearing Percentage or Pounds: L ankle Fx    Mobility  Bed Mobility                  Transfers                    Ambulation/Gait                 Stairs            Wheelchair Mobility    Modified Rankin (Stroke Patients Only)       Balance                                    Cognition Arousal/Alertness: Awake/alert Behavior During Therapy: Anxious Overall Cognitive Status: Within Functional Limits for tasks assessed                      Exercises Other Exercises Other Exercises: Pt participated in supine exercisesfor SLR, heel slides, AB/Adducation, quad sets, ankle pumps, leg presses, glut sets 5-10 reps each  LE    General Comments        Pertinent Vitals/Pain Pain Assessment: No/denies pain    Home Living                      Prior Function            PT Goals (current goals can now be found in the care plan section)      Frequency  Min 2X/week    PT Plan Current plan remains appropriate    Co-evaluation             End of Session Equipment Utilized During Treatment: Oxygen Activity Tolerance: Patient limited by fatigue;Other (comment) (anziety) Patient left: in bed;with call bell/phone within reach;with bed alarm set;with nursing/sitter in room     Time: 1202-1219 PT Time Calculation (min) (ACUTE ONLY): 17 min  Charges:  $Therapeutic Exercise: 8-22 mins                    G Codes:      Chesley Noon 02/02/2016, 1:39 PM

## 2016-02-01 LAB — BASIC METABOLIC PANEL
ANION GAP: 5 (ref 5–15)
BUN: 40 mg/dL — ABNORMAL HIGH (ref 6–20)
CALCIUM: 8.7 mg/dL — AB (ref 8.9–10.3)
CO2: 43 mmol/L — AB (ref 22–32)
CREATININE: 1.48 mg/dL — AB (ref 0.44–1.00)
Chloride: 99 mmol/L — ABNORMAL LOW (ref 101–111)
GFR calc Af Amer: 37 mL/min — ABNORMAL LOW (ref >60)
GFR, EST NON AFRICAN AMERICAN: 32 mL/min — AB (ref >60)
GLUCOSE: 81 mg/dL (ref 65–99)
Potassium: 3.5 mmol/L (ref 3.5–5.1)
Sodium: 147 mmol/L — ABNORMAL HIGH (ref 135–145)

## 2016-02-01 LAB — GLUCOSE, CAPILLARY
GLUCOSE-CAPILLARY: 86 mg/dL (ref 65–99)
GLUCOSE-CAPILLARY: 274 mg/dL — AB (ref 65–99)
GLUCOSE-CAPILLARY: 207 mg/dL — AB (ref 65–99)
Glucose-Capillary: 202 mg/dL — ABNORMAL HIGH (ref 65–99)
Glucose-Capillary: 219 mg/dL — ABNORMAL HIGH (ref 65–99)

## 2016-02-01 MED ORDER — ENOXAPARIN SODIUM 40 MG/0.4ML ~~LOC~~ SOLN
40.0000 mg | Freq: Two times a day (BID) | SUBCUTANEOUS | Status: DC
  Administered 2016-02-01 – 2016-02-06 (×10): 40 mg via SUBCUTANEOUS
  Filled 2016-02-01 (×10): qty 0.4

## 2016-02-01 MED ORDER — NYSTATIN 100000 UNIT/ML MT SUSP
5.0000 mL | Freq: Four times a day (QID) | OROMUCOSAL | Status: DC
  Administered 2016-02-01 – 2016-02-08 (×28): 500000 [IU] via ORAL
  Filled 2016-02-01 (×33): qty 5

## 2016-02-01 NOTE — Progress Notes (Signed)
[place] at [place] NAME: Jill York    MR#:  [id number]  DATE OF BIRTH:  1930/11/20  SUBJECTIVE: Admitted for shortness of breath   CHIEF COMPLAINT:   Chief Complaint  Patient presents with  . Respiratory Distress     on lasix drip, diuresed well  feels better   Still have some swelling on legs, thighs, abdominal and back walls.  REVIEW OF SYSTEMS:   Review of Systems  Constitutional: Positive for malaise/fatigue. Negative for chills, fever and weight loss.  HENT: Negative for nosebleeds and sore throat.   Eyes: Negative for blurred vision.  Respiratory: Positive for shortness of breath. Negative for cough and wheezing.   Cardiovascular: Positive for leg swelling and PND. Negative for chest pain and orthopnea.  Gastrointestinal: Negative for abdominal pain, constipation, diarrhea, heartburn, nausea and vomiting.  Genitourinary: Negative for dysuria and urgency.  Musculoskeletal: Negative for back pain.  Skin: Negative for rash.  Neurological: Positive for weakness. Negative for dizziness, speech change, focal weakness and headaches.  Endo/Heme/Allergies: Does not bruise/bleed easily.  Psychiatric/Behavioral: Positive for memory loss. Negative for depression.    DRUG ALLERGIES:   Allergies  Allergen Reactions  . Amlodipine Swelling    Leg swelling only   . Oxycodone Other (See Comments)    Pt states she was"out " for 2 hours and didnt notice people in the area   . Demerol [Meperidine] Diarrhea and Nausea And Vomiting  . Lisinopril Cough  . Tape Rash    VITALS:  Blood pressure (!) 124/49, pulse 86, temperature 97.8 F (36.6 C), temperature source Oral, resp. rate (!) 28, height 5' 4"  (1.626 m), weight 123 kg (271 lb 3.2 oz), SpO2 95 %. PHYSICAL EXAMINATION:  GENERAL:  82 y.o.-year-old patient lying in the bed with no acute distress.  EYES: Pupils equal, round, reactive to light and accommodation. No scleral  icterus. Extraocular muscles intact.  HEENT: Head atraumatic, normocephalic. Oropharynx and nasopharynx clear.  NECK:  Supple, no jugular venous distention. No thyroid enlargement, no tenderness.  LUNGS: Normal breath sounds bilaterally, no wheezing, rales,rhonchi or crepitation. No use of accessory muscles of respiration.  CARDIOVASCULAR: S1, S2 normal. No murmurs, rubs, or gallops.  ABDOMEN: Soft, nontender, nondistended. Bowel sounds present. No organomegaly or mass.  EXTREMITIES: pedal edema  better, no cyanosis, or clubbing.  Generalized anasarca + NEUROLOGIC: Cranial nerves II through XII are intact. Muscle strength 5/5 in all extremities. Sensation intact. Gait not checked.  PSYCHIATRIC: The patient is alert and oriented x 3.  SKIN: No obvious rash, lesion, or ulcer.  LABORATORY PANEL:   CBC  Recent Labs Lab 01/30/16 0653  WBC 6.3  HGB 8.4*  HCT 26.1*  PLT 141*   ------------------------------------------------------------------------------------------------------------------  Chemistries   Recent Labs Lab 01/28/16 0610  02/01/16 0630  NA 141  < > 147*  K 5.5*  < > 3.5  CL 104  < > 99*  CO2 29  < > 43*  GLUCOSE 276*  < > 81  BUN 68*  < > 40*  CREATININE 2.55*  < > 1.48*  CALCIUM 8.8*  < > 8.7*  AST 23  --   --   ALT 21  --   --   ALKPHOS 93  --   --   BILITOT 0.8  --   --   < > = values in this interval not displayed. ------------------------------------------------------------------------------------------------------------------  Cardiac Enzymes  Recent Labs Lab 01/29/16 1647  TROPONINI 0.08*   ------------------------------------------------------------------------------------------------------------------  RADIOLOGY:  No results found. ASSESSMENT AND PLAN:  [name] is a 82 y.o. white female with diabetes mellitus type II, diabetic neuropathy, diabetic retinopathy, hypertension, glaucoma, hyperlipidemia, depression, recent left navicular  fracture, who was admitted to [facility] on 01/27/2016 for worsening shortness of breath  * Generalized anasarca 2/2 cardio renal syn  -improving on lasix drip Monitor intake and output- not monitored well.   But pt symptomatically getting better.  * Acute Renal Failure on Chronic kidney disease stage III with proteinuria baseline creatinine of 1.4, eGFR of 34 Cr 2.6 -2.05- 1.48 - Could be due to cardiorenal syndrome, nephrology following.   - Improving on lasix drip  * Anxiety- xanax  * Hyperkalemia - resolved s/p Kayexalate  * Acute exacerbation of systolic and diastolic congestive heart failure: with anasarca.  - Initial chest x-ray showed pulmonary edema - Previous echo in July 2016 showed EF of 45-50% - cardio following  * Diabetes Mellitus type II: with chronic kidney disease: hemoglobin A1c of 8.4%   On lantus and SCC.  * Secondary Hyperparathyroidism: on calcitriol. PTH 77 from 12/13/15.   * Gen weakness - PT consult   All the records are reviewed and case discussed with Care Management/Social Worker. Management plans discussed with the patient, family (discussed with her daughter Jill York) and they are in agreement.  CODE STATUS: DNR  TOTAL TIME TAKING CARE OF THIS PATIENT: 35 minutes.   POSSIBLE D/C IN 2- DAYS, DEPENDING ON CLINICAL CONDITION.   Jill York M.D on 02/01/2016 at 5:01 PM  Between 7am to 6pm - Pager - [phone]  After 6pm go to [email or web address] - password [email or web address] [facility] [facility]  Office  [phone]  CC: Primary care physician; Jill Peach, MD   Note: This dictation was prepared with Dragon dictation along with smaller phrase technology. Any transcriptional errors that result from this process are unintentional.

## 2016-02-01 NOTE — Progress Notes (Signed)
Inpatient Diabetes Program Recommendations  AACE/ADA: New Consensus Statement on Inpatient Glycemic Control (2015)  Target Ranges:  Prepandial:   less than 140 mg/dL      Peak postprandial:   less than 180 mg/dL (1-2 hours)      Critically ill patients:  140 - 180 mg/dL   Lab Results  Component Value Date   GLUCAP 274 (H) 02/01/2016   HGBA1C 8.4 (H) 01/24/2014    Review of Glycemic Control  Results for FATOU, GEFFRARD (MRN [id number]) as of 02/01/2016 13:41  Ref. Range 01/31/2016 12:12 01/31/2016 16:31 01/31/2016 21:01 02/01/2016 07:47 02/01/2016 11:21  Glucose-Capillary Latest Ref Range: 65 - 99 mg/dL 230 (H) 190 (H) 224 (H) 86 274 (H)    Diabetes history: DM2 Outpatient Diabetes medications: Lantus 54 units QHS, Humalog 20 units with breakfast, 26 units with lunch, 20 units with supper Current orders for Inpatient glycemic control: Lantus 43 units QHS, Novolog 0-20 units TID with meals, Novolog 0-5 units QHS  Inpatient Diabetes Program Recommendations: Consider adding Novolog 3 units tid with meals and decreasing the Novolog correction to moderate scale 0-15 units tid.  This will ensure she has Novolog at mealtimes and will avoid spikes in pre-meal blood sugars.   Gentry Fitz, RN, BA, MHA, CDE Diabetes Coordinator Inpatient Diabetes Program  [phone] (Team Pager) [phone] ([place]) 02/01/2016 1:46 PM

## 2016-02-01 NOTE — Progress Notes (Signed)
Pt has white patches on the sides of her tongue and mouth. Dr. Anselm Jungling notified, Nystatin ordered. Ammie Dalton, RN

## 2016-02-01 NOTE — Plan of Care (Signed)
Problem: Health Behavior/Discharge Planning: Goal: Ability to manage health-related needs will improve Outcome: Progressing Pt is able to identify when she needs a breathing treatment.  Problem: Activity: Goal: Risk for activity intolerance will decrease Outcome: Not Progressing Pt is dyspneic on exertion. Verbalizes wishes to increase activity but cannot clinically tolerate.  Problem: Nutrition: Goal: Adequate nutrition will be maintained Outcome: Progressing Pt has been eating at least 75% of her meals.  Problem: Bowel/Gastric: Goal: Will not experience complications related to bowel motility Outcome: Progressing Pt had a bowel movement today.  Problem: Activity: Goal: Capacity to carry out activities will improve Outcome: Not Progressing Pt. Is still very dyspneic with activity.  Problem: Education: Goal: Ability to demonstrate managment of disease process will improve Outcome: Progressing Pt is very knowledgeable about her disease process's and medications. Goal: Ability to verbalize understanding of medication therapies will improve Outcome: Progressing Pt. Is very knowledgeable about her medications.

## 2016-02-01 NOTE — Progress Notes (Signed)
Physical Therapy Treatment Patient Details Name: Jill York MRN: [id number] DOB: 1931/06/10 Today's Date: February 02, 2016    History of Present Illness Pt is an 82 y/o female, who was sent to hospital from [facility], where she was receiving skilled PT following an ankle FX. She was admitted to the hospital due to confusion, hypotension, SOB, and rapid weight gain X 10lb. She is currently being treated for acute on CKD, anasarca, SOB, and other. Pt is PWB w/boot on L LE s/p L ankle Fx 12/19/15. PMH includes coronary angioplasty x4, COPD, CKD, CHF, and DM.    PT Comments    Pt in bed asleep but awoke easily.  Agrees to session.  Pt with poor ability to participate today due to lethargy.  Other mobility held due to safety and ability to participate in session.  Follow Up Recommendations  SNF     Equipment Recommendations  None recommended by PT    Recommendations for Other Services       Precautions / Restrictions Precautions Precautions: Fall Restrictions Weight Bearing Restrictions: No LLE Weight Bearing: Partial weight bearing LLE Partial Weight Bearing Percentage or Pounds: L ankle Fx    Mobility  Bed Mobility                  Transfers                    Ambulation/Gait                 Stairs            Wheelchair Mobility    Modified Rankin (Stroke Patients Only)       Balance                                    Cognition Arousal/Alertness: Lethargic                          Exercises Other Exercises Other Exercises: AA/PROM for heel slides, SLR, AB/Adduction and ankle pumps x 10.  Limited by lethargy today.    General Comments        Pertinent Vitals/Pain Pain Assessment: No/denies pain    Home Living                      Prior Function            PT Goals (current goals can now be found in the care plan section)      Frequency  Min 2X/week    PT Plan Current plan remains  appropriate    Co-evaluation             End of Session Equipment Utilized During Treatment: Oxygen Activity Tolerance: Patient limited by fatigue;Other (comment) Patient left: in bed;with call bell/phone within reach;with bed alarm set;with nursing/sitter in room     Time: 1002-1013 PT Time Calculation (min) (ACUTE ONLY): 11 min  Charges:  $Therapeutic Exercise: 8-22 mins                    G Codes:      Chesley Noon 02/02/2016, 10:47 AM

## 2016-02-01 NOTE — Progress Notes (Signed)
Subjective:  Patient originally presented to ED for increasing confusion for 2 days via EMS from [facility] Dx with acute exacerbation of CHF, treated with iv Lasix  uop not recorded. 6 unmeasured voids S Creatinine with minor fluctuation,  No new labs today   Care everywhere records show that 2-D echo from 2015 shows mildly enlarged left ventricular size, EF greater than 01%, grade 1 diastolic dysfunction, mild mitral regurgitation, no valvular stenosis Urine analysis is neg for protein  Objective:  Vital signs in last 24 hours:  Temp:  [97.8 F (36.6 C)-98.3 F (36.8 C)] 97.8 F (36.6 C) (08/10 1124) Pulse Rate:  [85-94] 86 (08/10 1124) Resp:  [18-28] 28 (08/10 1124) BP: (123-130)/(49-72) 124/49 (08/10 1124) SpO2:  [92 %-96 %] 95 % (08/10 1124)  Weight change:  Filed Weights   01/27/16 1120 01/27/16 1730 01/28/16 0637  Weight: 117.9 kg (260 lb) 125 kg (275 lb 8 oz) 123 kg (271 lb 3.2 oz)    Intake/Output:    Intake/Output Summary (Last 24 hours) at 02/01/16 1244 Last data filed at 02/01/16 0940  Gross per 24 hour  Intake              736 ml  Output                0 ml  Net              736 ml     Physical Exam: General: No acute distress, lying in the bed   HEENT Anicteric, moist oral mucous membranes   Neck Supple   Pulm/lungs Bilateral crackles at bases, oxygen by nasal cannula   CVS/Heart No rub, regular with ectopic beats   Abdomen:  Distended   Extremities: 2-3+ pitting edema up to thighs and lower abdomen   Neurologic: Alert, oriented   Skin: No acute rashes           Basic Metabolic Panel:   Recent Labs Lab 01/27/16 1116 01/28/16 0610 01/29/16 0650 01/29/16 1234 01/30/16 0653 02/01/16 0630  NA 143 141 144  --  146* 147*  K 5.4* 5.5* 5.8* 4.8 3.8 3.5  CL 107 104 109  --  106 99*  CO2 33* 29 27  --  37* 43*  GLUCOSE 60* 276* 134*  --  88 81  BUN 64* 68* 72*  --  60* 40*  CREATININE 2.46* 2.55* 2.60*  --  2.05* 1.48*  CALCIUM 8.5* 8.8*  8.6*  --  8.5* 8.7*  PHOS  --   --  5.2*  --   --   --      CBC:  Recent Labs Lab 01/27/16 1116 01/30/16 0653  WBC 6.5 6.3  NEUTROABS 5.0  --   HGB 8.6* 8.4*  HCT 27.6* 26.1*  MCV 95.2 92.6  PLT 155 141*      Microbiology:  Recent Results (from the past 720 hour(s))  MRSA PCR Screening     Status: None   Collection Time: 01/12/16  3:24 PM  Result Value Ref Range Status   MRSA by PCR NEGATIVE NEGATIVE Final    Comment:        The GeneXpert MRSA Assay (FDA approved for NASAL specimens only), is one component of a comprehensive MRSA colonization surveillance program. It is not intended to diagnose MRSA infection nor to guide or monitor treatment for MRSA infections.     Coagulation Studies: No results for input(s): LABPROT, INR in the last 72 hours.  Urinalysis: No results  for input(s): COLORURINE, LABSPEC, Iva, GLUCOSEU, HGBUR, BILIRUBINUR, KETONESUR, PROTEINUR, UROBILINOGEN, NITRITE, LEUKOCYTESUR in the last 72 hours.  Invalid input(s): APPERANCEUR    Imaging: No results found.   Medications:   . furosemide (LASIX) infusion 4 mg/hr (02/01/16 0140)   . aspirin EC  81 mg Oral Daily  . atorvastatin  80 mg Oral Daily  . calcitRIOL  0.25 mcg Oral Q M,W,F  . cholecalciferol  1,000 Units Oral Daily  . fluticasone  1 spray Each Nare Daily  . gabapentin  300 mg Oral BID  . insulin aspart  0-20 Units Subcutaneous TID WC  . insulin aspart  0-5 Units Subcutaneous QHS  . insulin glargine  43 Units Subcutaneous QHS  . latanoprost  1 drop Both Eyes QHS  . levothyroxine  137 mcg Oral Once per day on Sun Mon Tue Wed Thu Sat   And  . [START ON 02/02/2016] levothyroxine  274 mcg Oral Once per day on Fri  . mometasone-formoterol  2 puff Inhalation BID  . montelukast  10 mg Oral QHS  . nystatin  5 mL Oral QID  . pantoprazole  40 mg Oral BID AC  . pneumococcal 23 valent vaccine  0.5 mL Intramuscular Tomorrow-1000  . polyethylene glycol  17 g Oral Daily  .  senna-docusate  2 tablet Oral Daily  . sodium chloride flush  10-40 mL Intracatheter Q12H  . sodium chloride flush  3 mL Intravenous Q12H  . sodium chloride flush  3 mL Intravenous Q12H  . venlafaxine  75 mg Oral BID   sodium chloride, acetaminophen, acetaminophen, albuterol, ALPRAZolam, baclofen, bisacodyl, docusate sodium, naphazoline-glycerin, nitroGLYCERIN, ondansetron, promethazine, sodium chloride flush, sodium chloride flush  Assessment/ Plan:  82 y.o. female with diabetes mellitus type II, diabetic neuropathy, diabetic retinopathy, hypertension, glaucoma, hyperlipidemia, depression, recent left navicular fracture, who was admitted to [facility] on 01/27/2016   1. Acute Renal Failure on Chronic kidney disease stage III. Baseline creatinine of 1.4, eGFR of 34. Acute renal failure secondary to acute exacerbation of congestive heart failure and then overdiuresis. Chronic kidney disease from hypertension and diabetes.  S Cr at 1.48 Labs daily  2. Anasarca.  - d/c iv fluids - continue lasix drip as she still has significant amount of edema - avoid hypotension - strict I/O - weigh daily  3.  Diabetes Mellitus type II: with chronic kidney disease: hemoglobin A1c of 8.4% - low salt, carb restricted diet    LOS: 5 Haden Cavenaugh 8/10/201712:44 PM

## 2016-02-02 LAB — BASIC METABOLIC PANEL
Anion gap: 5 (ref 5–15)
BUN: 39 mg/dL — AB (ref 6–20)
CHLORIDE: 98 mmol/L — AB (ref 101–111)
CO2: 43 mmol/L — AB (ref 22–32)
CREATININE: 1.57 mg/dL — AB (ref 0.44–1.00)
Calcium: 8.4 mg/dL — ABNORMAL LOW (ref 8.9–10.3)
GFR calc Af Amer: 34 mL/min — ABNORMAL LOW (ref >60)
GFR calc non Af Amer: 29 mL/min — ABNORMAL LOW (ref >60)
Glucose, Bld: 82 mg/dL (ref 65–99)
POTASSIUM: 3.5 mmol/L (ref 3.5–5.1)
SODIUM: 146 mmol/L — AB (ref 135–145)

## 2016-02-02 LAB — CBC
HCT: 25.2 % — ABNORMAL LOW (ref 35.0–47.0)
Hemoglobin: 8 g/dL — ABNORMAL LOW (ref 12.0–16.0)
MCH: 29.5 pg (ref 26.0–34.0)
MCHC: 31.7 g/dL — ABNORMAL LOW (ref 32.0–36.0)
MCV: 93.1 fL (ref 80.0–100.0)
PLATELETS: 101 10*3/uL — AB (ref 150–440)
RBC: 2.71 MIL/uL — AB (ref 3.80–5.20)
RDW: 19.3 % — AB (ref 11.5–14.5)
WBC: 5.7 10*3/uL (ref 3.6–11.0)

## 2016-02-02 LAB — GLUCOSE, CAPILLARY
GLUCOSE-CAPILLARY: 61 mg/dL — AB (ref 65–99)
GLUCOSE-CAPILLARY: 205 mg/dL — AB (ref 65–99)
GLUCOSE-CAPILLARY: 236 mg/dL — AB (ref 65–99)
Glucose-Capillary: 90 mg/dL (ref 65–99)
Glucose-Capillary: 141 mg/dL — ABNORMAL HIGH (ref 65–99)

## 2016-02-02 MED ORDER — AMOXICILLIN-POT CLAVULANATE 875-125 MG PO TABS
1.0000 | ORAL_TABLET | Freq: Two times a day (BID) | ORAL | Status: DC
  Administered 2016-02-02 – 2016-02-06 (×9): 1 via ORAL
  Filled 2016-02-02 (×11): qty 1

## 2016-02-02 MED ORDER — INSULIN GLARGINE 100 UNIT/ML ~~LOC~~ SOLN
30.0000 [IU] | Freq: Every day | SUBCUTANEOUS | Status: DC
  Administered 2016-02-02 – 2016-02-04 (×3): 30 [IU] via SUBCUTANEOUS
  Filled 2016-02-02 (×4): qty 0.3

## 2016-02-02 NOTE — Progress Notes (Signed)
Subjective:  Patient originally presented to ED for increasing confusion for 2 days via EMS from [facility] Dx with acute exacerbation of CHF, treated with iv Lasix  uop not recorded. 5 unmeasured voids S Creatinine with minor fluctuation,  No new labs today   Care everywhere records show that 2-D echo from 2015 shows mildly enlarged left ventricular size, EF greater than 99%, grade 1 diastolic dysfunction, mild mitral regurgitation, no valvular stenosis Urine analysis is neg for protein  Objective:  Vital signs in last 24 hours:  Temp:  [97.5 F (36.4 C)-98.7 F (37.1 C)] 97.5 F (36.4 C) (08/11 0809) Pulse Rate:  [86-95] 90 (08/11 0809) Resp:  [18-28] 20 (08/11 0809) BP: (122-128)/(36-52) 122/36 (08/11 0809) SpO2:  [92 %-98 %] 97 % (08/11 0809)  Weight change:  Filed Weights   01/27/16 1120 01/27/16 1730 01/28/16 0637  Weight: 117.9 kg (260 lb) 125 kg (275 lb 8 oz) 123 kg (271 lb 3.2 oz)    Intake/Output:    Intake/Output Summary (Last 24 hours) at 02/02/16 1055 Last data filed at 02/02/16 1033  Gross per 24 hour  Intake              480 ml  Output              150 ml  Net              330 ml     Physical Exam: General: No acute distress, lying in the bed   HEENT Anicteric, moist oral mucous membranes   Neck Supple   Pulm/lungs Bilateral crackles at bases, oxygen by nasal cannula   CVS/Heart No rub, regular with ectopic beats   Abdomen:  Distended   Extremities: 2-3+ pitting edema up to thighs and lower abdomen   Neurologic: Alert, oriented   Skin: No acute rashes           Basic Metabolic Panel:   Recent Labs Lab 01/28/16 0610 01/29/16 0650 01/29/16 1234 01/30/16 0653 02/01/16 0630 02/02/16 0500  NA 141 144  --  146* 147* 146*  K 5.5* 5.8* 4.8 3.8 3.5 3.5  CL 104 109  --  106 99* 98*  CO2 29 27  --  37* 43* 43*  GLUCOSE 276* 134*  --  88 81 82  BUN 68* 72*  --  60* 40* 39*  CREATININE 2.55* 2.60*  --  2.05* 1.48* 1.57*  CALCIUM 8.8* 8.6*   --  8.5* 8.7* 8.4*  PHOS  --  5.2*  --   --   --   --      CBC:  Recent Labs Lab 01/27/16 1116 01/30/16 0653 02/02/16 0500  WBC 6.5 6.3 5.7  NEUTROABS 5.0  --   --   HGB 8.6* 8.4* 8.0*  HCT 27.6* 26.1* 25.2*  MCV 95.2 92.6 93.1  PLT 155 141* 101*      Microbiology:  Recent Results (from the past 720 hour(s))  MRSA PCR Screening     Status: None   Collection Time: 01/12/16  3:24 PM  Result Value Ref Range Status   MRSA by PCR NEGATIVE NEGATIVE Final    Comment:        The GeneXpert MRSA Assay (FDA approved for NASAL specimens only), is one component of a comprehensive MRSA colonization surveillance program. It is not intended to diagnose MRSA infection nor to guide or monitor treatment for MRSA infections.     Coagulation Studies: No results for input(s): LABPROT, INR in  the last 72 hours.  Urinalysis: No results for input(s): COLORURINE, LABSPEC, PHURINE, GLUCOSEU, HGBUR, BILIRUBINUR, KETONESUR, PROTEINUR, UROBILINOGEN, NITRITE, LEUKOCYTESUR in the last 72 hours.  Invalid input(s): APPERANCEUR    Imaging: No results found.   Medications:   . furosemide (LASIX) infusion 4 mg/hr (02/01/16 0140)   . amoxicillin-clavulanate  1 tablet Oral Q12H  . aspirin EC  81 mg Oral Daily  . atorvastatin  80 mg Oral Daily  . calcitRIOL  0.25 mcg Oral Q M,W,F  . cholecalciferol  1,000 Units Oral Daily  . enoxaparin (LOVENOX) injection  40 mg Subcutaneous Q12H  . fluticasone  1 spray Each Nare Daily  . gabapentin  300 mg Oral BID  . insulin aspart  0-20 Units Subcutaneous TID WC  . insulin aspart  0-5 Units Subcutaneous QHS  . insulin glargine  30 Units Subcutaneous QHS  . latanoprost  1 drop Both Eyes QHS  . levothyroxine  137 mcg Oral Once per day on Sun Mon Tue Wed Thu Sat   And  . levothyroxine  274 mcg Oral Once per day on Fri  . mometasone-formoterol  2 puff Inhalation BID  . montelukast  10 mg Oral QHS  . nystatin  5 mL Oral QID  . pantoprazole  40 mg  Oral BID AC  . pneumococcal 23 valent vaccine  0.5 mL Intramuscular Tomorrow-1000  . polyethylene glycol  17 g Oral Daily  . senna-docusate  2 tablet Oral Daily  . sodium chloride flush  10-40 mL Intracatheter Q12H  . sodium chloride flush  3 mL Intravenous Q12H  . sodium chloride flush  3 mL Intravenous Q12H  . venlafaxine  75 mg Oral BID   sodium chloride, acetaminophen, acetaminophen, albuterol, ALPRAZolam, baclofen, bisacodyl, docusate sodium, naphazoline-glycerin, nitroGLYCERIN, ondansetron, promethazine, sodium chloride flush, sodium chloride flush  Assessment/ Plan:  82 y.o. female with diabetes mellitus type II, diabetic neuropathy, diabetic retinopathy, hypertension, glaucoma, hyperlipidemia, depression, recent left navicular fracture, who was admitted to [facility] on 01/27/2016   1. Acute Renal Failure on Chronic kidney disease stage III. Baseline creatinine of 1.4, eGFR of 34. Acute renal failure secondary to acute exacerbation of congestive heart failure and then overdiuresis. Chronic kidney disease from hypertension and diabetes.  S Cr at 1.48 Labs daily  2. Anasarca.  - d/c iv fluids - continue lasix drip as she still has significant amount of edema - avoid hypotension - strict I/O - weigh daily  3.  Diabetes Mellitus type II: with chronic kidney disease: hemoglobin A1c of 8.4% - low salt, carb restricted diet    LOS: 6 Jill York 8/11/201710:55 AM

## 2016-02-02 NOTE — Care Management Important Message (Signed)
Important Message  Patient Details  Name: Jill York MRN: [id number] Date of Birth: 1930/10/21   Medicare Important Message Given:  Yes    Jolly Mango, RN 02/02/2016, 9:00 AM

## 2016-02-02 NOTE — Progress Notes (Signed)
[facility]  SUBJECTIVE: Mild sob but better than yesterday   Vitals:   02/02/16 0653 02/02/16 0809 02/02/16 1131 02/02/16 1137  BP: (!) 128/52 (!) 122/36  (!) 132/48  Pulse: 90 90  95  Resp: 18 20  20   Temp: 97.6 F (36.4 C) 97.5 F (36.4 C)  97.7 F (36.5 C)  TempSrc: Oral Oral  Oral  SpO2: 92% 97%  96%  Weight:   114.2 kg (251 lb 11.2 oz)   Height:        Intake/Output Summary (Last 24 hours) at 02/02/16 1825 Last data filed at 02/02/16 1804  Gross per 24 hour  Intake           808.27 ml  Output                0 ml  Net           808.27 ml    LABS: Basic Metabolic Panel:  Recent Labs  02/01/16 0630 02/02/16 0500  NA 147* 146*  K 3.5 3.5  CL 99* 98*  CO2 43* 43*  GLUCOSE 81 82  BUN 40* 39*  CREATININE 1.48* 1.57*  CALCIUM 8.7* 8.4*   Liver Function Tests: No results for input(s): AST, ALT, ALKPHOS, BILITOT, PROT, ALBUMIN in the last 72 hours. No results for input(s): LIPASE, AMYLASE in the last 72 hours. CBC:  Recent Labs  02/02/16 0500  WBC 5.7  HGB 8.0*  HCT 25.2*  MCV 93.1  PLT 101*   Cardiac Enzymes: No results for input(s): CKTOTAL, CKMB, CKMBINDEX, TROPONINI in the last 72 hours. BNP: Invalid input(s): POCBNP D-Dimer: No results for input(s): DDIMER in the last 72 hours. Hemoglobin A1C: No results for input(s): HGBA1C in the last 72 hours. Fasting Lipid Panel: No results for input(s): CHOL, HDL, LDLCALC, TRIG, CHOLHDL, LDLDIRECT in the last 72 hours. Thyroid Function Tests: No results for input(s): TSH, T4TOTAL, T3FREE, THYROIDAB in the last 72 hours.  Invalid input(s): FREET3 Anemia Panel: No results for input(s): VITAMINB12, FOLATE, FERRITIN, TIBC, IRON, RETICCTPCT in the last 72 hours.   Physical Exam: Blood pressure (!) 132/48, pulse 95, temperature 97.7 F (36.5 C), temperature source Oral, resp. rate 20, height 5\' 4"  (1.626 m), weight 114.2 kg (251 lb 11.2 oz), SpO2 96 %.   Wt Readings  from Last 1 Encounters:  02/02/16 114.2 kg (251 lb 11.2 oz)     General appearance: alert and cooperative Neck: no carotid bruit, no JVD, supple, symmetrical, trachea midline and thyroid not enlarged, symmetric, no tenderness/mass/nodules Resp: rhonchi bibasilar Cardio: irregularly irregular rhythm GI: soft, non-tender; bowel sounds normal; no masses,  no organomegaly Extremities: extremities normal, atraumatic, no cyanosis or edema Neurologic: Grossly normal  TELEMETRY: Reviewed telemetry pt in afib with variable vr:  ASSESSMENT AND PLAN:  Active Problems:   CHF (congestive heart failure) (HCC)-clinically improved today. cxr shows poor aeration with probable interstitial edema. Continues on lasix drip. Renal function stable. I/o not acurate due to not measuring urine output. Echo showed preserved lv funciton . Continuing with lasix drip. Anasarca persists.  Mild anemiahgb dropped slightly to 8.0. No obvious bleeding  afib-rate conrolled. Will continue to defer anticoagulation until hgb stabilizes.      Teodoro Spray., MD, [facility] 02/02/2016 6:25 PM

## 2016-02-02 NOTE — Progress Notes (Signed)
Inpatient Diabetes Program Recommendations  AACE/ADA: New Consensus Statement on Inpatient Glycemic Control (2015)  Target Ranges:  Prepandial:   less than 140 mg/dL      Peak postprandial:   less than 180 mg/dL (1-2 hours)      Critically ill patients:  140 - 180 mg/dL   Results for Jill York, Jill York (MRN [id number]) as of 02/02/2016 09:57  Ref. Range 02/01/2016 07:47 02/01/2016 11:21 02/01/2016 16:11 02/01/2016 17:24 02/01/2016 20:55 02/02/2016 07:48 02/02/2016 08:29  Glucose-Capillary Latest Ref Range: 65 - 99 mg/dL 86 274 (H) 207 (H) 202 (H) 219 (H) 61 (L) 90    Review of Glycemic Control  Diabetes history: DM2 Outpatient Diabetes medications: Lantus 54 units QHS, Humalog 20 units with breakfast, 26 units with lunch, 20 units with supper Current orders for Inpatient glycemic control: Lantus 43 units QHS, Novolog 0-20 units TID with meals, Novolog 0-5 units QHS  Inpatient Diabetes Program Recommendations: Insulin - Basal: Fasting glucose 61 mg/dl this morning.  Please consider decreasing Lantus to 40 units QHS. Insulin-Meal Coverage: Post prandial glucose is consistently elevated. Please consider ordering Novolog 5 units TID with meals for meal coverage.  Thanks, Barnie Alderman, RN, MSN, CDE Diabetes Coordinator Inpatient Diabetes Program [phone] (Team Pager from [place] to [place]) [phone] ([place] office) [phone] [facility] office) [phone] [facility] office)

## 2016-02-02 NOTE — Progress Notes (Signed)
Physical Therapy Treatment Patient Details Name: Jill York MRN: [id number] DOB: 10-10-1930 Today's Date: 02/02/2016    History of Present Illness Pt is an 82 y/o female, who was sent to hospital from [facility], where she was receiving skilled PT following an ankle FX. She was admitted to the hospital due to confusion, hypotension, SOB, and rapid weight gain X 10lb. She is currently being treated for acute on CKD, anasarca, SOB, and other. Pt is PWB w/boot on L LE s/p L ankle Fx 12/19/15. PMH includes coronary angioplasty x4, COPD, CKD, CHF, and DM.    PT Comments    Pt is able to attempt standing and despite being anxious and fatigued with the effort she ultimately is able to do more than she had in the last week.  She reports she was only walking ~10 ft at rehab and little more than that at home prior to ankle fx.  Pt initially agreed to try standing another bout after some rest, but began to cough in sitting at EOB and voided her bladder, she did not feel like doing any more at that point and CNAs were present to assist.  Pt did show good strength with light in bed exercises, though she did fatigue with these as well.     Follow Up Recommendations  SNF     Equipment Recommendations  None recommended by PT    Recommendations for Other Services       Precautions / Restrictions Precautions Precautions: Fall Restrictions Weight Bearing Restrictions: Yes LLE Weight Bearing: Partial weight bearing (with walking boot)    Mobility  Bed Mobility Overal bed mobility: Needs Assistance Bed Mobility: Supine to Sit;Sit to Supine     Supine to sit: Mod assist Sit to supine: Max assist   General bed mobility comments: Pt shows ability to get LEs to EOB, needs assist to lift torso and get to fully upright position  Transfers Overall transfer level: Needs assistance Equipment used: Rolling walker (2 wheeled) Transfers: Sit to/from Stand Sit to Stand: Max assist;Mod assist         General transfer comment: Pt anxious and needing a lot of cuing.  First attempt her R foot slid forward despite much cuing for set up and plan, she was able to get to standing on the next attempt after some seated rest and cuing.  Ambulation/Gait             General Gait Details: Pt was too weak, too anxious and too tired to attempt taken even a small step.  She was able to standing ~90 seconds with a lot of encouragement and cuing.    Stairs            Wheelchair Mobility    Modified Rankin (Stroke Patients Only)       Balance Overall balance assessment: Needs assistance Sitting-balance support: Bilateral upper extremity supported Sitting balance-Leahy Scale: Fair       Standing balance-Leahy Scale: Poor Standing balance comment: Pt lacking confidence and fatigues quickly with the effort.                     Cognition Arousal/Alertness: Lethargic Behavior During Therapy: Anxious Overall Cognitive Status: Within Functional Limits for tasks assessed                      Exercises General Exercises - Lower Extremity Ankle Circles/Pumps: AROM;10 reps;Both Heel Slides: Strengthening;10 reps;Both Hip ABduction/ADduction: Strengthening;10 reps;Both Straight Leg Raises: AROM;10  reps;Both    General Comments        Pertinent Vitals/Pain Pain Assessment: No/denies pain    Home Living                      Prior Function            PT Goals (current goals can now be found in the care plan section) Progress towards PT goals: Progressing toward goals    Frequency  Min 2X/week    PT Plan Current plan remains appropriate    Co-evaluation             End of Session Equipment Utilized During Treatment: Oxygen (3 liters, pt with fatigue/SOB but O2 did remain ~90 t/o tx) Activity Tolerance: Patient limited by fatigue (anxiety) Patient left: with nursing/sitter in room;in bed     Time: 1435-1501 PT Time Calculation (min)  (ACUTE ONLY): 26 min  Charges:  $Therapeutic Exercise: 8-22 mins $Therapeutic Activity: 8-22 mins                    G Codes:      Kreg Shropshire,  DPT 02/02/2016, 3:30 PM

## 2016-02-02 NOTE — Clinical Social Work Note (Signed)
Patient is from [facility], plan for patient to return once she is medically ready for discharge and orders have been received.  MSW updated [facility] informing them that patient not medically ready for discharge yet per physician.  MSW to continue to follow patient's progress throughout discharge planning.  Jones Broom. Jill York, MSW [phone]  Mon-Fri 8a-4:30p 02/02/2016 5:00 PM

## 2016-02-02 NOTE — Progress Notes (Signed)
[place] [facility] [place] NAME: Jill York    MR#:  [id number]  DATE OF BIRTH:  Nov 16, 1930  SUBJECTIVE: Admitted for shortness of breath   CHIEF COMPLAINT:   Chief Complaint  Patient presents with  . Respiratory Distress     on lasix drip, diuresed well  feels better   Still have some swelling on legs, thighs, abdominal and back walls.   She has urinary incontinence so unable to measure exit urine output but she had 5 urinary incontinence episodes noted in last 24 hours.   Her body weight was not being monitored for last 1 week but today checked again and compared to last week it showed significant drop in body weight.   Also noted to have some redness on her right side breast and the lower abdominal wall mainly on the left side.  REVIEW OF SYSTEMS:   Review of Systems  Constitutional: Positive for malaise/fatigue. Negative for chills, fever and weight loss.  HENT: Negative for nosebleeds and sore throat.   Eyes: Negative for blurred vision.  Respiratory: Positive for shortness of breath. Negative for cough and wheezing.   Cardiovascular: Positive for leg swelling and PND. Negative for chest pain and orthopnea.  Gastrointestinal: Negative for abdominal pain, constipation, diarrhea, heartburn, nausea and vomiting.  Genitourinary: Negative for dysuria and urgency.  Musculoskeletal: Negative for back pain.  Skin: Negative for rash.  Neurological: Positive for weakness. Negative for dizziness, speech change, focal weakness and headaches.  Endo/Heme/Allergies: Does not bruise/bleed easily.  Psychiatric/Behavioral: Positive for memory loss. Negative for depression.    DRUG ALLERGIES:   Allergies  Allergen Reactions  . Amlodipine Swelling    Leg swelling only   . Oxycodone Other (See Comments)    Pt states she was"out " for 2 hours and didnt notice people in the area   . Demerol [Meperidine] Diarrhea and Nausea And Vomiting  .  Lisinopril Cough  . Tape Rash    VITALS:  Blood pressure (!) 132/48, pulse 95, temperature 97.7 F (36.5 C), temperature source Oral, resp. rate 20, height 5' 4"  (1.626 m), weight 114.2 kg (251 lb 11.2 oz), SpO2 96 %. PHYSICAL EXAMINATION:  GENERAL:  82 y.o.-year-old patient lying in the bed with no acute distress.  EYES: Pupils equal, round, reactive to light and accommodation. No scleral icterus. Extraocular muscles intact.  HEENT: Head atraumatic, normocephalic. Oropharynx and nasopharynx clear.  NECK:  Supple, no jugular venous distention. No thyroid enlargement, no tenderness.  LUNGS: Normal breath sounds bilaterally, no wheezing, rales,rhonchi or crepitation. No use of accessory muscles of respiration.  CARDIOVASCULAR: S1, S2 normal. No murmurs, rubs, or gallops.  ABDOMEN: Soft, nontender, nondistended. Bowel sounds present. No organomegaly or mass.  EXTREMITIES: pedal edema  better, no cyanosis, or clubbing.  Generalized anasarca + NEUROLOGIC: Cranial nerves II through XII are intact. Muscle strength 5/5 in all extremities. Sensation intact. Gait not checked.  PSYCHIATRIC: The patient is alert and oriented x 3.  SKIN: On right breast and abdominal wall mainly on the left side has induration and redness on the skin but no localized fluid collection.  LABORATORY PANEL:   CBC  Recent Labs Lab 02/02/16 0500  WBC 5.7  HGB 8.0*  HCT 25.2*  PLT 101*   ------------------------------------------------------------------------------------------------------------------  Chemistries   Recent Labs Lab 01/28/16 0610  02/02/16 0500  NA 141  < > 146*  K 5.5*  < > 3.5  CL 104  < > 98*  CO2  29  < > 43*  GLUCOSE 276*  < > 82  BUN 68*  < > 39*  CREATININE 2.55*  < > 1.57*  CALCIUM 8.8*  < > 8.4*  AST 23  --   --   ALT 21  --   --   ALKPHOS 93  --   --   BILITOT 0.8  --   --   < > = values in this interval not  displayed. ------------------------------------------------------------------------------------------------------------------  Cardiac Enzymes  Recent Labs Lab 01/29/16 1647  TROPONINI 0.08*   ------------------------------------------------------------------------------------------------------------------  RADIOLOGY:  No results found. ASSESSMENT AND PLAN:  Jill York is a 82 y.o. white female with diabetes mellitus type II, diabetic neuropathy, diabetic retinopathy, hypertension, glaucoma, hyperlipidemia, depression, recent left navicular fracture, who was admitted to [facility] on 01/27/2016 for worsening shortness of breath  * Generalized anasarca 2/2 cardio renal syn  -improving on lasix drip Monitor intake and output- not monitored well as patient has incontinence.   But pt symptomatically getting better.   As per the body weight measurement she has dropped 9 kg in last 1 week.  * Acute Renal Failure on Chronic kidney disease stage III with proteinuria baseline creatinine of 1.4, eGFR of 34 Cr 2.6 -2.05- 1.48 - Could be due to cardiorenal syndrome, nephrology following.   - Improving on lasix drip  * Anxiety- xanax  * Hyperkalemia - resolved s/p Kayexalate  * Acute exacerbation of systolic and diastolic congestive heart failure: with anasarca.  - Initial chest x-ray showed pulmonary edema - Previous echo in July 2016 showed EF of 45-50% - cardio following, appreciate his help.  * Cellulitis on Right breast and abdominal wall.   Added augmentin 02/02/16.  * Diabetes Mellitus type II: with chronic kidney disease: hemoglobin A1c of 8.4%   On lantus and SCC.  * Secondary Hyperparathyroidism: on calcitriol. PTH 77 from 12/13/15.   * Gen weakness - PT consult   All the records are reviewed and case discussed with Care Management/Social Worker. Management plans discussed with the patient, family and they are in agreement.  CODE STATUS: DNR  TOTAL TIME TAKING CARE  OF THIS PATIENT: 35 minutes.   POSSIBLE D/C IN 2- DAYS, DEPENDING ON CLINICAL CONDITION.   Vaughan Basta M.D on 02/02/2016 at 5:47 PM  Between 7am to 6pm - Pager - [phone]  After 6pm go to [email or web address] - password [email or web address] [facility]  Office  [phone]  CC: Primary care physician; Sharyne Peach, MD   Note: This dictation was prepared with Dragon dictation along with smaller phrase technology. Any transcriptional errors that result from this process are unintentional.

## 2016-02-03 LAB — GLUCOSE, CAPILLARY
GLUCOSE-CAPILLARY: 136 mg/dL — AB (ref 65–99)
GLUCOSE-CAPILLARY: 256 mg/dL — AB (ref 65–99)
Glucose-Capillary: 113 mg/dL — ABNORMAL HIGH (ref 65–99)
Glucose-Capillary: 243 mg/dL — ABNORMAL HIGH (ref 65–99)

## 2016-02-03 LAB — CBC
HCT: 24.5 % — ABNORMAL LOW (ref 35.0–47.0)
HEMOGLOBIN: 7.8 g/dL — AB (ref 12.0–16.0)
MCH: 29.2 pg (ref 26.0–34.0)
MCHC: 31.8 g/dL — AB (ref 32.0–36.0)
MCV: 91.6 fL (ref 80.0–100.0)
Platelets: 108 10*3/uL — ABNORMAL LOW (ref 150–440)
RBC: 2.67 MIL/uL — ABNORMAL LOW (ref 3.80–5.20)
RDW: 19.4 % — ABNORMAL HIGH (ref 11.5–14.5)
WBC: 5.5 10*3/uL (ref 3.6–11.0)

## 2016-02-03 LAB — BASIC METABOLIC PANEL
Anion gap: 5 (ref 5–15)
BUN: 39 mg/dL — AB (ref 6–20)
CHLORIDE: 94 mmol/L — AB (ref 101–111)
CO2: 46 mmol/L — ABNORMAL HIGH (ref 22–32)
CREATININE: 1.5 mg/dL — AB (ref 0.44–1.00)
Calcium: 8.4 mg/dL — ABNORMAL LOW (ref 8.9–10.3)
GFR calc Af Amer: 36 mL/min — ABNORMAL LOW (ref >60)
GFR calc non Af Amer: 31 mL/min — ABNORMAL LOW (ref >60)
Glucose, Bld: 179 mg/dL — ABNORMAL HIGH (ref 65–99)
Potassium: 3.5 mmol/L (ref 3.5–5.1)
SODIUM: 145 mmol/L (ref 135–145)

## 2016-02-03 LAB — ALBUMIN: ALBUMIN: 2.8 g/dL — AB (ref 3.5–5.0)

## 2016-02-03 MED ORDER — ALBUMIN HUMAN 25 % IV SOLN
12.5000 g | Freq: Two times a day (BID) | INTRAVENOUS | Status: AC
  Administered 2016-02-03 – 2016-02-05 (×5): 12.5 g via INTRAVENOUS
  Filled 2016-02-03 (×7): qty 50

## 2016-02-03 NOTE — Progress Notes (Signed)
Subjective:  Patient originally presented to ED for increasing confusion for 2 days via EMS from [facility] Dx with acute exacerbation of CHF, treated with iv Lasix  uop 500+ unmeasured voids S Creatinine with minor fluctuation,  Cr 1.50   Care everywhere records show that 2-D echo from 2015 shows mildly enlarged left ventricular size, EF greater than 76%, grade 1 diastolic dysfunction, mild mitral regurgitation, no valvular stenosis Urine analysis is neg for protein  Objective:  Vital signs in last 24 hours:  Temp:  [97.7 F (36.5 C)-98.4 F (36.9 C)] 98.4 F (36.9 C) (08/12 0800) Pulse Rate:  [82-95] 82 (08/12 0800) Resp:  [18-20] 18 (08/12 0800) BP: (129-135)/(47-55) 132/47 (08/12 0800) SpO2:  [96 %-99 %] 96 % (08/12 0800) Weight:  [113.5 kg (250 lb 3.2 oz)-114.2 kg (251 lb 11.2 oz)] 113.5 kg (250 lb 3.2 oz) (08/12 0548)  Weight change:  Filed Weights   01/28/16 0637 02/02/16 1131 02/03/16 0548  Weight: 123 kg (271 lb 3.2 oz) 114.2 kg (251 lb 11.2 oz) 113.5 kg (250 lb 3.2 oz)    Intake/Output:    Intake/Output Summary (Last 24 hours) at 02/03/16 0934 Last data filed at 02/02/16 2115  Gross per 24 hour  Intake              951 ml  Output              500 ml  Net              451 ml     Physical Exam: General: No acute distress, lying in the bed   HEENT Anicteric, moist oral mucous membranes   Neck Supple   Pulm/lungs Bilateral crackles at bases, oxygen by nasal cannula   CVS/Heart No rub, regular with ectopic beats   Abdomen:  Distended   Extremities: 2-3+ pitting edema up to thighs and lower abdomen , + arms  Neurologic: Alert, oriented   Skin: No acute rashes           Basic Metabolic Panel:   Recent Labs Lab 01/29/16 0650 01/29/16 1234 01/30/16 0653 02/01/16 0630 02/02/16 0500 02/03/16 0443  NA 144  --  146* 147* 146* 145  K 5.8* 4.8 3.8 3.5 3.5 3.5  CL 109  --  106 99* 98* 94*  CO2 27  --  37* 43* 43* 46*  GLUCOSE 134*  --  88 81 82 179*   BUN 72*  --  60* 40* 39* 39*  CREATININE 2.60*  --  2.05* 1.48* 1.57* 1.50*  CALCIUM 8.6*  --  8.5* 8.7* 8.4* 8.4*  PHOS 5.2*  --   --   --   --   --      CBC:  Recent Labs Lab 01/27/16 1116 01/30/16 0653 02/02/16 0500 02/03/16 0443  WBC 6.5 6.3 5.7 5.5  NEUTROABS 5.0  --   --   --   HGB 8.6* 8.4* 8.0* 7.8*  HCT 27.6* 26.1* 25.2* 24.5*  MCV 95.2 92.6 93.1 91.6  PLT 155 141* 101* 108*      Microbiology:  Recent Results (from the past 720 hour(s))  MRSA PCR Screening     Status: None   Collection Time: 01/12/16  3:24 PM  Result Value Ref Range Status   MRSA by PCR NEGATIVE NEGATIVE Final    Comment:        The GeneXpert MRSA Assay (FDA approved for NASAL specimens only), is one component of a comprehensive MRSA colonization surveillance  program. It is not intended to diagnose MRSA infection nor to guide or monitor treatment for MRSA infections.     Coagulation Studies: No results for input(s): LABPROT, INR in the last 72 hours.  Urinalysis: No results for input(s): COLORURINE, LABSPEC, PHURINE, GLUCOSEU, HGBUR, BILIRUBINUR, KETONESUR, PROTEINUR, UROBILINOGEN, NITRITE, LEUKOCYTESUR in the last 72 hours.  Invalid input(s): APPERANCEUR    Imaging: No results found.   Medications:   . furosemide (LASIX) infusion 4 mg/hr (02/01/16 0140)   . amoxicillin-clavulanate  1 tablet Oral Q12H  . aspirin EC  81 mg Oral Daily  . atorvastatin  80 mg Oral Daily  . calcitRIOL  0.25 mcg Oral Q M,W,F  . cholecalciferol  1,000 Units Oral Daily  . enoxaparin (LOVENOX) injection  40 mg Subcutaneous Q12H  . fluticasone  1 spray Each Nare Daily  . gabapentin  300 mg Oral BID  . insulin aspart  0-20 Units Subcutaneous TID WC  . insulin aspart  0-5 Units Subcutaneous QHS  . insulin glargine  30 Units Subcutaneous QHS  . latanoprost  1 drop Both Eyes QHS  . levothyroxine  137 mcg Oral Once per day on Sun Mon Tue Wed Thu Sat   And  . levothyroxine  274 mcg Oral Once per  day on Fri  . mometasone-formoterol  2 puff Inhalation BID  . montelukast  10 mg Oral QHS  . nystatin  5 mL Oral QID  . pantoprazole  40 mg Oral BID AC  . pneumococcal 23 valent vaccine  0.5 mL Intramuscular Tomorrow-1000  . polyethylene glycol  17 g Oral Daily  . senna-docusate  2 tablet Oral Daily  . sodium chloride flush  10-40 mL Intracatheter Q12H  . sodium chloride flush  3 mL Intravenous Q12H  . venlafaxine  75 mg Oral BID   sodium chloride, acetaminophen, acetaminophen, albuterol, ALPRAZolam, baclofen, bisacodyl, docusate sodium, naphazoline-glycerin, nitroGLYCERIN, ondansetron, promethazine, sodium chloride flush, sodium chloride flush  Assessment/ Plan:  82 y.o. female with diabetes mellitus type II, diabetic neuropathy, diabetic retinopathy, hypertension, glaucoma, hyperlipidemia, depression, recent left navicular fracture, who was admitted to [facility] on 01/27/2016   1. Acute Renal Failure on Chronic kidney disease stage III. Baseline creatinine of 1.4, eGFR of 34. Acute renal failure secondary to acute exacerbation of congestive heart failure and then overdiuresis. Chronic kidney disease from hypertension and diabetes.  S Cr at 1.5 Labs daily  2. Anasarca.  - d/c iv fluids - continue lasix drip as she still has significant amount of edema - avoid hypotension - strict I/O - weigh daily - trial of iv albumin to assist with diuresis - Urinalysis is negative for protein, echo shows normal EF with diastolic dysfunction. -  Consider right upper quadrant ultrasound of the liver to evaluate for cirrhosis as patient has persistently low albumin   3.  Diabetes Mellitus type II: with chronic kidney disease: hemoglobin A1c of 8.4% - low salt, carb restricted diet    LOS: 7 Yoav Okane 8/12/20179:34 AM

## 2016-02-03 NOTE — Progress Notes (Signed)
[place] at [place] NAME: Jill York    MR#:  [id number]  DATE OF BIRTH:  02-Sep-1930  SUBJECTIVE: Admitted for shortness of breath   CHIEF COMPLAINT:   Chief Complaint  Patient presents with  . Respiratory Distress     on lasix drip, diuresed well  feels better   Still have some swelling on legs, thighs, abdominal and back walls.   She has urinary incontinence so unable to measure exit urine output but she had 5 urinary incontinence episodes noted in last 24 hours.   Her body weight was not being monitored for last 1 week but today checked again and compared to last week it showed significant drop in body weight.   Also noted to have some redness on her right side breast and the lower abdominal wall mainly on the left side.  REVIEW OF SYSTEMS:   Review of Systems  Constitutional: Positive for malaise/fatigue. Negative for chills, fever and weight loss.  HENT: Negative for nosebleeds and sore throat.   Eyes: Negative for blurred vision.  Respiratory: Positive for shortness of breath. Negative for cough and wheezing.   Cardiovascular: Positive for leg swelling and PND. Negative for chest pain and orthopnea.  Gastrointestinal: Negative for abdominal pain, constipation, diarrhea, heartburn, nausea and vomiting.  Genitourinary: Negative for dysuria and urgency.  Musculoskeletal: Negative for back pain.  Skin: Negative for rash.  Neurological: Positive for weakness. Negative for dizziness, speech change, focal weakness and headaches.  Endo/Heme/Allergies: Does not bruise/bleed easily.  Psychiatric/Behavioral: Positive for memory loss. Negative for depression.    DRUG ALLERGIES:   Allergies  Allergen Reactions  . Amlodipine Swelling    Leg swelling only   . Oxycodone Other (See Comments)    Pt states she was"out " for 2 hours and didnt notice people in the area   . Demerol [Meperidine] Diarrhea and Nausea And Vomiting  .  Lisinopril Cough  . Tape Rash    VITALS:  Blood pressure (!) 129/46, pulse 91, temperature 98.6 F (37 C), temperature source Oral, resp. rate (!) 28, height 5' 4"  (1.626 m), weight 113.5 kg (250 lb 3.2 oz), SpO2 94 %. PHYSICAL EXAMINATION:  GENERAL:  82 y.o.-year-old patient lying in the bed with no acute distress.  EYES: Pupils equal, round, reactive to light and accommodation. No scleral icterus. Extraocular muscles intact.  HEENT: Head atraumatic, normocephalic. Oropharynx and nasopharynx clear.  NECK:  Supple, no jugular venous distention. No thyroid enlargement, no tenderness.  LUNGS: Normal breath sounds bilaterally, no wheezing, rales,rhonchi or crepitation. No use of accessory muscles of respiration.  CARDIOVASCULAR: S1, S2 normal. No murmurs, rubs, or gallops.  ABDOMEN: Soft, nontender, nondistended. Bowel sounds present. No organomegaly or mass.  EXTREMITIES: pedal edema  better, no cyanosis, or clubbing.  Generalized anasarca + NEUROLOGIC: Cranial nerves II through XII are intact. Muscle strength 5/5 in all extremities. Sensation intact. Gait not checked.  PSYCHIATRIC: The patient is alert and oriented x 3.  SKIN: On right breast and abdominal wall mainly on the left side has induration and redness on the skin ( better today than yesterday) but no localized fluid collection.  LABORATORY PANEL:   CBC  Recent Labs Lab 02/03/16 0443  WBC 5.5  HGB 7.8*  HCT 24.5*  PLT 108*   ------------------------------------------------------------------------------------------------------------------  Chemistries   Recent Labs Lab 01/28/16 0610  02/03/16 0443  NA 141  < > 145  K 5.5*  < > 3.5  CL 104  < >  94*  CO2 29  < > 46*  GLUCOSE 276*  < > 179*  BUN 68*  < > 39*  CREATININE 2.55*  < > 1.50*  CALCIUM 8.8*  < > 8.4*  AST 23  --   --   ALT 21  --   --   ALKPHOS 93  --   --   BILITOT 0.8  --   --   < > = values in this interval not  displayed. ------------------------------------------------------------------------------------------------------------------  Cardiac Enzymes  Recent Labs Lab 01/29/16 1647  TROPONINI 0.08*   ------------------------------------------------------------------------------------------------------------------  RADIOLOGY:  No results found. ASSESSMENT AND PLAN:  Ms. ISMA TIETJE is a 82 y.o. white female with diabetes mellitus type II, diabetic neuropathy, diabetic retinopathy, hypertension, glaucoma, hyperlipidemia, depression, recent left navicular fracture, who was admitted to [facility] on 01/27/2016 for worsening shortness of breath  * Generalized anasarca 2/2 cardio renal syn  -improving on lasix drip Monitor intake and output- not monitored well as patient has incontinence.   But pt symptomatically getting better.   As per the body weight measurement she has dropped 9 kg in last 1 week.   dropped 1 kg since yesterday.  * Acute Renal Failure on Chronic kidney disease stage III with proteinuria baseline creatinine of 1.4, eGFR of 34 Cr 2.6 -2.05- 1.48 - Could be due to cardiorenal syndrome, nephrology following.   - Improving on lasix drip  * Anxiety- xanax  * Hyperkalemia - resolved s/p Kayexalate  * Acute exacerbation of systolic and diastolic congestive heart failure: with anasarca.  - Initial chest x-ray showed pulmonary edema - Previous echo in July 2016 showed EF of 45-50% - cardio following, appreciate his help. - if not much help- then may consider inotropic drip.  * Cellulitis on Right breast and abdominal wall.   Added augmentin 02/02/16.  * Diabetes Mellitus type II: with chronic kidney disease: hemoglobin A1c of 8.4%   On lantus and SCC.  * Secondary Hyperparathyroidism: on calcitriol. PTH 77 from 12/13/15.   * Gen weakness - PT consult   All the records are reviewed and case discussed with Care Management/Social Worker. Management plans discussed with the  patient, family and they are in agreement.  CODE STATUS: DNR  TOTAL TIME TAKING CARE OF THIS PATIENT: 35 minutes.   POSSIBLE D/C IN 2- DAYS, DEPENDING ON CLINICAL CONDITION.   Vaughan Basta M.D on 02/03/2016 at 2:34 PM  Between 7am to 6pm - Pager - [phone]  After 6pm go to [email or web address] - password [email or web address] [place] [facility]  Office  [phone]  CC: Primary care physician; Sharyne Peach, MD   Note: This dictation was prepared with Dragon dictation along with smaller phrase technology. Any transcriptional errors that result from this process are unintentional.

## 2016-02-03 NOTE — Progress Notes (Signed)
[place]  SUBJECTIVE: still some edema. Able to ambulate with some difficulty. Remains on Lasix drip   Vitals:   02/02/16 2104 02/03/16 0442 02/03/16 0548 02/03/16 0800  BP:  (!) 129/55  (!) 132/47  Pulse:  91  82  Resp:  18  18  Temp:  98.2 F (36.8 C)  98.4 F (36.9 C)  TempSrc:  Oral  Oral  SpO2: 97% 97%  96%  Weight:   113.5 kg (250 lb 3.2 oz)   Height:        Intake/Output Summary (Last 24 hours) at 02/03/16 1017 Last data filed at 02/03/16 0930  Gross per 24 hour  Intake              951 ml  Output             1100 ml  Net             -149 ml    LABS: Basic Metabolic Panel:  Recent Labs  02/02/16 0500 02/03/16 0443  NA 146* 145  K 3.5 3.5  CL 98* 94*  CO2 43* 46*  GLUCOSE 82 179*  BUN 39* 39*  CREATININE 1.57* 1.50*  CALCIUM 8.4* 8.4*   Liver Function Tests:  Recent Labs  02/03/16 0443  ALBUMIN 2.8*   No results for input(s): LIPASE, AMYLASE in the last 72 hours. CBC:  Recent Labs  02/02/16 0500 02/03/16 0443  WBC 5.7 5.5  HGB 8.0* 7.8*  HCT 25.2* 24.5*  MCV 93.1 91.6  PLT 101* 108*   Cardiac Enzymes: No results for input(s): CKTOTAL, CKMB, CKMBINDEX, TROPONINI in the last 72 hours. BNP: Invalid input(s): POCBNP D-Dimer: No results for input(s): DDIMER in the last 72 hours. Hemoglobin A1C: No results for input(s): HGBA1C in the last 72 hours. Fasting Lipid Panel: No results for input(s): CHOL, HDL, LDLCALC, TRIG, CHOLHDL, LDLDIRECT in the last 72 hours. Thyroid Function Tests: No results for input(s): TSH, T4TOTAL, T3FREE, THYROIDAB in the last 72 hours.  Invalid input(s): FREET3 Anemia Panel: No results for input(s): VITAMINB12, FOLATE, FERRITIN, TIBC, IRON, RETICCTPCT in the last 72 hours.   Physical Exam: Blood pressure (!) 132/47, pulse 82, temperature 98.4 F (36.9 C), temperature source Oral, resp. rate 18, height 5\' 4"  (1.626 m), weight 113.5 kg (250 lb 3.2 oz), SpO2 96 %.   Wt  Readings from Last 1 Encounters:  02/03/16 113.5 kg (250 lb 3.2 oz)     General appearance: alert and cooperative Resp: clear to auscultation bilaterally Cardio: systolic murmur: systolic ejection 2/6, blowing at apex Extremities: edema Anasarca to her mid thighs Neurologic: Grossly normal  TELEMETRY: Reviewed telemetry pt in atrial fibrillation at rate of 85  ASSESSMENT AND PLAN:  Active Problems:   CHF (congestive heart failure) (HCC)-echocardiogram done in 2016 revealed preserved LV function EF 50%.she continues with anasarca but it is improving. Continues with Lasix drip. We continue with this. Discussion with nephrology regarding possible inotrope trial. This can be considered if improvement does not continue to occur. We'll continue with Lasix drip and follow renal function improvement and anasarca.    Teodoro Spray., MD, [facility] 02/03/2016 10:17 AM

## 2016-02-04 LAB — CBC
HCT: 24.8 % — ABNORMAL LOW (ref 35.0–47.0)
HEMOGLOBIN: 8 g/dL — AB (ref 12.0–16.0)
MCH: 29.5 pg (ref 26.0–34.0)
MCHC: 32.2 g/dL (ref 32.0–36.0)
MCV: 91.5 fL (ref 80.0–100.0)
PLATELETS: 104 10*3/uL — AB (ref 150–440)
RBC: 2.71 MIL/uL — AB (ref 3.80–5.20)
RDW: 18.8 % — ABNORMAL HIGH (ref 11.5–14.5)
WBC: 4.8 10*3/uL (ref 3.6–11.0)

## 2016-02-04 LAB — BASIC METABOLIC PANEL
ANION GAP: 6 (ref 5–15)
BUN: 32 mg/dL — AB (ref 6–20)
CHLORIDE: 94 mmol/L — AB (ref 101–111)
CO2: 45 mmol/L — ABNORMAL HIGH (ref 22–32)
Calcium: 8.8 mg/dL — ABNORMAL LOW (ref 8.9–10.3)
Creatinine, Ser: 1.54 mg/dL — ABNORMAL HIGH (ref 0.44–1.00)
GFR, EST AFRICAN AMERICAN: 35 mL/min — AB (ref >60)
GFR, EST NON AFRICAN AMERICAN: 30 mL/min — AB (ref >60)
Glucose, Bld: 224 mg/dL — ABNORMAL HIGH (ref 65–99)
POTASSIUM: 3.5 mmol/L (ref 3.5–5.1)
SODIUM: 145 mmol/L (ref 135–145)

## 2016-02-04 LAB — GLUCOSE, CAPILLARY
GLUCOSE-CAPILLARY: 184 mg/dL — AB (ref 65–99)
GLUCOSE-CAPILLARY: 240 mg/dL — AB (ref 65–99)
GLUCOSE-CAPILLARY: 316 mg/dL — AB (ref 65–99)
Glucose-Capillary: 225 mg/dL — ABNORMAL HIGH (ref 65–99)

## 2016-02-04 MED ORDER — ALUM & MAG HYDROXIDE-SIMETH 200-200-20 MG/5ML PO SUSP
15.0000 mL | Freq: Four times a day (QID) | ORAL | Status: DC | PRN
  Administered 2016-02-04: 15 mL via ORAL
  Filled 2016-02-04: qty 30

## 2016-02-04 MED ORDER — GLUCERNA SHAKE PO LIQD
237.0000 mL | Freq: Three times a day (TID) | ORAL | Status: DC
Start: 2016-02-04 — End: 2016-02-06

## 2016-02-04 MED ORDER — GLUCERNA SHAKE PO LIQD
237.0000 mL | Freq: Three times a day (TID) | ORAL | Status: DC
  Administered 2016-02-04 – 2016-02-07 (×10): 237 mL via ORAL

## 2016-02-04 NOTE — Progress Notes (Signed)
Subjective:  Patient originally presented to ED for increasing confusion for 2 days via EMS from [place] [facility] with acute exacerbation of CHF, treated with iv Lasix  uop 1900+ unmeasured voids. UOP improved  S Creatinine with minor fluctuation,  Cr 1.54   Care everywhere records show that 2-D echo from 2015 shows mildly enlarged left ventricular size, EF greater than 55%, grade 1 diastolic dysfunction, mild mitral regurgitation, no valvular stenosis Urine analysis is neg for protein  Objective:  Vital signs in last 24 hours:  Temp:  [98.2 F (36.8 C)-98.6 F (37 C)] 98.4 F (36.9 C) (08/13 0800) Pulse Rate:  [81-98] 88 (08/13 0800) Resp:  [18-28] 22 (08/13 0800) BP: (109-149)/(36-47) 109/45 (08/13 0800) SpO2:  [94 %-95 %] 94 % (08/13 0800) Weight:  [112.4 kg (247 lb 12.8 oz)] 112.4 kg (247 lb 12.8 oz) (08/13 0521)  Weight change: -1.769 kg (-3 lb 14.4 oz) Filed Weights   02/02/16 1131 02/03/16 0548 02/04/16 0521  Weight: 114.2 kg (251 lb 11.2 oz) 113.5 kg (250 lb 3.2 oz) 112.4 kg (247 lb 12.8 oz)    Intake/Output:    Intake/Output Summary (Last 24 hours) at 02/04/16 0948 Last data filed at 02/04/16 0634  Gross per 24 hour  Intake             1040 ml  Output             1300 ml  Net             -260 ml     Physical Exam: General: No acute distress, lying in the bed   HEENT Anicteric, moist oral mucous membranes   Neck Supple   Pulm/lungs Bilateral crackles at bases, oxygen by nasal cannula   CVS/Heart No rub, regular with ectopic beats   Abdomen:  Distended   Extremities: 2-3+ pitting edema up to thighs and lower abdomen , + arms  Neurologic: Alert, oriented   Skin: No acute rashes           Basic Metabolic Panel:   Recent Labs Lab 01/29/16 0650  01/30/16 0653 02/01/16 0630 02/02/16 0500 02/03/16 0443 02/04/16 0426  NA 144  --  146* 147* 146* 145 145  K 5.8*  < > 3.8 3.5 3.5 3.5 3.5  CL 109  --  106 99* 98* 94* 94*  CO2 27  --  37* 43* 43* 46* 45*   GLUCOSE 134*  --  88 81 82 179* 224*  BUN 72*  --  60* 40* 39* 39* 32*  CREATININE 2.60*  --  2.05* 1.48* 1.57* 1.50* 1.54*  CALCIUM 8.6*  --  8.5* 8.7* 8.4* 8.4* 8.8*  PHOS 5.2*  --   --   --   --   --   --   < > = values in this interval not displayed.   CBC:  Recent Labs Lab 01/30/16 0653 02/02/16 0500 02/03/16 0443 02/04/16 0426  WBC 6.3 5.7 5.5 4.8  HGB 8.4* 8.0* 7.8* 8.0*  HCT 26.1* 25.2* 24.5* 24.8*  MCV 92.6 93.1 91.6 91.5  PLT 141* 101* 108* 104*      Microbiology:  Recent Results (from the past 720 hour(s))  MRSA PCR Screening     Status: None   Collection Time: 01/12/16  3:24 PM  Result Value Ref Range Status   MRSA by PCR NEGATIVE NEGATIVE Final    Comment:        The GeneXpert MRSA Assay (FDA approved for NASAL specimens   only), is one component of a comprehensive MRSA colonization surveillance program. It is not intended to diagnose MRSA infection nor to guide or monitor treatment for MRSA infections.     Coagulation Studies: No results for input(s): LABPROT, INR in the last 72 hours.  Urinalysis: No results for input(s): COLORURINE, LABSPEC, PHURINE, GLUCOSEU, HGBUR, BILIRUBINUR, KETONESUR, PROTEINUR, UROBILINOGEN, NITRITE, LEUKOCYTESUR in the last 72 hours.  Invalid input(s): APPERANCEUR    Imaging: No results found.   Medications:   . furosemide (LASIX) infusion 4 mg/hr (02/03/16 1642)   . albumin human  12.5 g Intravenous BID  . amoxicillin-clavulanate  1 tablet Oral Q12H  . aspirin EC  81 mg Oral Daily  . atorvastatin  80 mg Oral Daily  . calcitRIOL  0.25 mcg Oral Q M,W,F  . cholecalciferol  1,000 Units Oral Daily  . enoxaparin (LOVENOX) injection  40 mg Subcutaneous Q12H  . fluticasone  1 spray Each Nare Daily  . gabapentin  300 mg Oral BID  . insulin aspart  0-20 Units Subcutaneous TID WC  . insulin aspart  0-5 Units Subcutaneous QHS  . insulin glargine  30 Units Subcutaneous QHS  . latanoprost  1 drop Both Eyes QHS  .  levothyroxine  137 mcg Oral Once per day on Sun Mon Tue Wed Thu Sat   And  . levothyroxine  274 mcg Oral Once per day on Fri  . mometasone-formoterol  2 puff Inhalation BID  . montelukast  10 mg Oral QHS  . nystatin  5 mL Oral QID  . pantoprazole  40 mg Oral BID AC  . pneumococcal 23 valent vaccine  0.5 mL Intramuscular Tomorrow-1000  . polyethylene glycol  17 g Oral Daily  . senna-docusate  2 tablet Oral Daily  . sodium chloride flush  10-40 mL Intracatheter Q12H  . sodium chloride flush  3 mL Intravenous Q12H  . venlafaxine  75 mg Oral BID   sodium chloride, acetaminophen, acetaminophen, albuterol, ALPRAZolam, baclofen, bisacodyl, docusate sodium, naphazoline-glycerin, nitroGLYCERIN, ondansetron, promethazine, sodium chloride flush, sodium chloride flush  Assessment/ Plan:  82 y.o. female with diabetes mellitus type II, diabetic neuropathy, diabetic retinopathy, hypertension, glaucoma, hyperlipidemia, depression, recent left navicular fracture, who was admitted to [facility] on 01/27/2016   1. Acute Renal Failure on Chronic kidney disease stage III. Baseline creatinine of 1.4, eGFR of 34. Acute renal failure secondary to acute exacerbation of congestive heart failure and then overdiuresis. Chronic kidney disease from hypertension and diabetes.  S Cr at 1.5 Labs and electrolytes daily  2. Anasarca.  - d/c iv fluids - continue lasix drip as she still has significant amount of edema - avoid hypotension - strict I/O - weigh daily - trial of iv albumin to assist with diuresis - Urinalysis is negative for protein, echo shows normal EF with diastolic dysfunction. -  Consider right upper quadrant ultrasound of the liver to evaluate for cirrhosis as patient has persistently low albumin   3.  Diabetes Mellitus type II: with chronic kidney disease: hemoglobin A1c of 8.4% - low salt, carb restricted diet  4. Alkalosis - check ABG - likely compensation for resp acidosis    LOS:  8 Tameem Pullara 8/13/20179:48 AM

## 2016-02-04 NOTE — Progress Notes (Signed)
Pt alert & oriented x4, VSS afebrile. Tele Afib, Pt requested her xanax prior to sleep. Denied pain. IJ line intact/patent. Lasix infusing cont at 70ml/hr. 02/[place] @2l  cont.

## 2016-02-04 NOTE — Progress Notes (Signed)
Jill York at [place] NAME: Jill York    MR#:  [id number]  DATE OF BIRTH:  11-26-1930  SUBJECTIVE: Admitted for shortness of breath   CHIEF COMPLAINT:   Chief Complaint  Patient presents with  . Respiratory Distress     on lasix drip, diuresed well  feels better   Still have some swelling on legs, thighs, abdominal and back walls.   She has urinary incontinence so unable to measure exit urine output but she had 5 urinary incontinence episodes noted in last 24 hours.   Her body weight was not being monitored for last 1 week but today checked again and compared to last week it showed significant drop in body weight.   Also noted to have some redness on her right side breast and the lower abdominal wall mainly on the left side.   C/o not eating good.  REVIEW OF SYSTEMS:   Review of Systems  Constitutional: Positive for malaise/fatigue. Negative for chills, fever and weight loss.  HENT: Negative for nosebleeds and sore throat.   Eyes: Negative for blurred vision.  Respiratory: Positive for shortness of breath. Negative for cough and wheezing.   Cardiovascular: Positive for leg swelling and PND. Negative for chest pain and orthopnea.  Gastrointestinal: Negative for abdominal pain, constipation, diarrhea, heartburn, nausea and vomiting.  Genitourinary: Negative for dysuria and urgency.  Musculoskeletal: Negative for back pain.  Skin: Negative for rash.  Neurological: Positive for weakness. Negative for dizziness, speech change, focal weakness and headaches.  Endo/Heme/Allergies: Does not bruise/bleed easily.  Psychiatric/Behavioral: Positive for memory loss. Negative for depression.    DRUG ALLERGIES:   Allergies  Allergen Reactions  . Amlodipine Swelling    Leg swelling only   . Oxycodone Other (See Comments)    Pt states she was"out " for 2 hours and didnt notice people in the area   . Demerol [Meperidine] Diarrhea and  Nausea And Vomiting  . Lisinopril Cough  . Tape Rash    VITALS:  Blood pressure (!) 128/55, pulse 85, temperature 97.8 F (36.6 C), temperature source Oral, resp. rate 17, height '5\' 4"'$  (1.626 m), weight 112.4 kg (247 lb 12.8 oz), SpO2 94 %. PHYSICAL EXAMINATION:  GENERAL:  82 y.o.-year-old patient lying in the bed with no acute distress.  EYES: Pupils equal, round, reactive to light and accommodation. No scleral icterus. Extraocular muscles intact.  HEENT: Head atraumatic, normocephalic. Oropharynx and nasopharynx clear.  NECK:  Supple, no jugular venous distention. No thyroid enlargement, no tenderness.  LUNGS: Normal breath sounds bilaterally, no wheezing, rales,rhonchi or crepitation. No use of accessory muscles of respiration.  CARDIOVASCULAR: S1, S2 normal. No murmurs, rubs, or gallops.  ABDOMEN: Soft, nontender, nondistended. Bowel sounds present. No organomegaly or mass.  EXTREMITIES: pedal edema  better, no cyanosis, or clubbing.  Generalized anasarca + NEUROLOGIC: Cranial nerves II through XII are intact. Muscle strength 5/5 in all extremities. Sensation intact. Gait not checked.  PSYCHIATRIC: The patient is alert and oriented x 3.  SKIN: On right breast and abdominal wall mainly on the left side has induration and redness on the skin ( better today than yesterday) but no localized fluid collection.   LABORATORY PANEL:   CBC  Recent Labs Lab 02/04/16 0426  WBC 4.8  HGB 8.0*  HCT 24.8*  PLT 104*   ------------------------------------------------------------------------------------------------------------------  Chemistries   Recent Labs Lab 02/04/16 0426  NA 145  K 3.5  CL 94*  CO2 45*  GLUCOSE  224*  BUN 32*  CREATININE 1.54*  CALCIUM 8.8*   ------------------------------------------------------------------------------------------------------------------  Cardiac Enzymes  Recent Labs Lab 01/29/16 1647  TROPONINI 0.08*    ------------------------------------------------------------------------------------------------------------------  RADIOLOGY:  No results found. ASSESSMENT AND PLAN:  Jill York is a 82 y.o. white female with diabetes mellitus type II, diabetic neuropathy, diabetic retinopathy, hypertension, glaucoma, hyperlipidemia, depression, recent left navicular fracture, who was admitted to [facility] on 01/27/2016 for worsening shortness of breath  * Generalized anasarca 2/2 cardio renal syn  -improving on lasix drip Monitor intake and output- not monitored well as patient has incontinence.   But pt symptomatically getting better.   As per the body weight measurement she has dropped 9 kg in last 1 week.   dropped 1 kg WEIGHT since yesterday.   * Acute Renal Failure on Chronic kidney disease stage III with proteinuria baseline creatinine of 1.4, eGFR of 34 Cr 2.6 -2.05- 1.48 - Could be due to cardiorenal syndrome, nephrology following.   - Improving on lasix drip  * Anxiety- xanax  * Hyperkalemia - resolved s/p Kayexalate  * Acute exacerbation of systolic and diastolic congestive heart failure: with anasarca.  - Initial chest x-ray showed pulmonary edema - Previous echo in July 2016 showed EF of 45-50% - cardio following, appreciate his help. - if not much help- then may consider inotropic drip.  * Cellulitis on Right breast and abdominal wall.   Added augmentin 02/02/16.  * Diabetes Mellitus type II: with chronic kidney disease: hemoglobin A1c of 8.4%   On lantus and SCC.  * Secondary Hyperparathyroidism: on calcitriol. PTH 77 from 12/13/15.   * Gen weakness - PT consult  * Decreased oral intake and weakness   Glucerna and PT eval.   All the records are reviewed and case discussed with Care Management/Social Worker. Management plans discussed with the patient, family and they are in agreement.  CODE STATUS: DNR  TOTAL TIME TAKING CARE OF THIS PATIENT: 35 minutes.    POSSIBLE D/C IN 2- DAYS, DEPENDING ON CLINICAL CONDITION.   Jill York M.D on 02/04/2016 at 2:03 PM  Between 7am to 6pm - Pager - [phone]  After 6pm go to [email or web address] - password [email or web address] [facility]  Office  [phone]  CC: Primary care physician; Sharyne Peach, MD   Note: This dictation was prepared with Dragon dictation along with smaller phrase technology. Any transcriptional errors that result from this process are unintentional.

## 2016-02-05 ENCOUNTER — Inpatient Hospital Stay: Payer: Medicare Other

## 2016-02-05 LAB — BLOOD GAS, ARTERIAL
ACID-BASE EXCESS: 28.5 mmol/L — AB (ref 0.0–3.0)
Bicarbonate: 56.4 mEq/L — ABNORMAL HIGH (ref 21.0–28.0)
FIO2: 0.28
O2 Saturation: 95.3 %
PCO2 ART: 85 mmHg — AB (ref 32.0–48.0)
PH ART: 7.43 (ref 7.350–7.450)
Patient temperature: 37
pO2, Arterial: 75 mmHg — ABNORMAL LOW (ref 83.0–108.0)

## 2016-02-05 LAB — COMPREHENSIVE METABOLIC PANEL
ALK PHOS: 72 U/L (ref 38–126)
ALT: 14 U/L (ref 14–54)
AST: 15 U/L (ref 15–41)
Albumin: 3.5 g/dL (ref 3.5–5.0)
Anion gap: 5 (ref 5–15)
BUN: 32 mg/dL — ABNORMAL HIGH (ref 6–20)
CALCIUM: 9 mg/dL (ref 8.9–10.3)
CHLORIDE: 92 mmol/L — AB (ref 101–111)
CO2: 47 mmol/L — AB (ref 22–32)
CREATININE: 1.55 mg/dL — AB (ref 0.44–1.00)
GFR calc non Af Amer: 30 mL/min — ABNORMAL LOW (ref >60)
GFR, EST AFRICAN AMERICAN: 35 mL/min — AB (ref >60)
GLUCOSE: 228 mg/dL — AB (ref 65–99)
Potassium: 3.4 mmol/L — ABNORMAL LOW (ref 3.5–5.1)
SODIUM: 144 mmol/L (ref 135–145)
Total Bilirubin: 1.1 mg/dL (ref 0.3–1.2)
Total Protein: 6.9 g/dL (ref 6.5–8.1)

## 2016-02-05 LAB — GLUCOSE, CAPILLARY
GLUCOSE-CAPILLARY: 288 mg/dL — AB (ref 65–99)
Glucose-Capillary: 222 mg/dL — ABNORMAL HIGH (ref 65–99)
Glucose-Capillary: 228 mg/dL — ABNORMAL HIGH (ref 65–99)
Glucose-Capillary: 230 mg/dL — ABNORMAL HIGH (ref 65–99)

## 2016-02-05 MED ORDER — POTASSIUM CHLORIDE CRYS ER 20 MEQ PO TBCR
20.0000 meq | EXTENDED_RELEASE_TABLET | Freq: Every day | ORAL | Status: DC
  Administered 2016-02-05 – 2016-02-08 (×4): 20 meq via ORAL
  Filled 2016-02-05 (×4): qty 1

## 2016-02-05 MED ORDER — INSULIN GLARGINE 100 UNIT/ML ~~LOC~~ SOLN
33.0000 [IU] | Freq: Every day | SUBCUTANEOUS | Status: DC
  Administered 2016-02-05 – 2016-02-06 (×2): 33 [IU] via SUBCUTANEOUS
  Filled 2016-02-05 (×3): qty 0.33

## 2016-02-05 MED ORDER — INSULIN ASPART 100 UNIT/ML ~~LOC~~ SOLN
5.0000 [IU] | Freq: Three times a day (TID) | SUBCUTANEOUS | Status: DC
  Administered 2016-02-05 – 2016-02-08 (×9): 5 [IU] via SUBCUTANEOUS
  Filled 2016-02-05 (×8): qty 5

## 2016-02-05 NOTE — Progress Notes (Signed)
Subjective:  Patient states that her respiratory status has improved. Creatinine stable at 1.5 with a BUN of 32. Urine output was 2.1 L yesterday. She was net -1.6 L. She still has some lower extremity edema.  Objective:  Vital signs in last 24 hours:  Temp:  [97.7 F (36.5 C)-99.3 F (37.4 C)] 97.7 F (36.5 C) (08/14 0345) Pulse Rate:  [84-96] 84 (08/14 0345) Resp:  [17-30] 30 (08/14 0345) BP: (123-132)/(45-55) 123/45 (08/14 0345) SpO2:  [93 %-96 %] 94 % (08/14 0345) Weight:  [110.6 kg (243 lb 12.8 oz)] 110.6 kg (243 lb 12.8 oz) (08/14 0345)  Weight change: -1.814 kg (-4 lb) Filed Weights   02/03/16 0548 02/04/16 0521 02/05/16 0345  Weight: 113.5 kg (250 lb 3.2 oz) 112.4 kg (247 lb 12.8 oz) 110.6 kg (243 lb 12.8 oz)    Intake/Output:    Intake/Output Summary (Last 24 hours) at 02/05/16 1118 Last data filed at 02/05/16 0500  Gross per 24 hour  Intake              186 ml  Output             1300 ml  Net            -1114 ml     Physical Exam: General: No acute distress, lying in the bed   HEENT Anicteric, moist oral mucous membranes   Neck Supple   Pulm/lungs Bilateral crackles at bases, oxygen by nasal cannula   CVS/Heart No rub, regular with ectopic beats   Abdomen:  Distended, BS present  Extremities: 2+ pitting edema up to thighs and lower abdomen , + arms  Neurologic: Alert, oriented   Skin: No acute rashes           Basic Metabolic Panel:   Recent Labs Lab 02/01/16 0630 02/02/16 0500 02/03/16 0443 02/04/16 0426 02/05/16 0536  NA 147* 146* 145 145 144  K 3.5 3.5 3.5 3.5 3.4*  CL 99* 98* 94* 94* 92*  CO2 43* 43* 46* 45* 47*  GLUCOSE 81 82 179* 224* 228*  BUN 40* 39* 39* 32* 32*  CREATININE 1.48* 1.57* 1.50* 1.54* 1.55*  CALCIUM 8.7* 8.4* 8.4* 8.8* 9.0     CBC:  Recent Labs Lab 01/30/16 0653 02/02/16 0500 02/03/16 0443 02/04/16 0426  WBC 6.3 5.7 5.5 4.8  HGB 8.4* 8.0* 7.8* 8.0*  HCT 26.1* 25.2* 24.5* 24.8*  MCV 92.6 93.1 91.6 91.5   PLT 141* 101* 108* 104*      Microbiology:  Recent Results (from the past 720 hour(s))  MRSA PCR Screening     Status: None   Collection Time: 01/12/16  3:24 PM  Result Value Ref Range Status   MRSA by PCR NEGATIVE NEGATIVE Final    Comment:        The GeneXpert MRSA Assay (FDA approved for NASAL specimens only), is one component of a comprehensive MRSA colonization surveillance program. It is not intended to diagnose MRSA infection nor to guide or monitor treatment for MRSA infections.     Coagulation Studies: No results for input(s): LABPROT, INR in the last 72 hours.  Urinalysis: No results for input(s): COLORURINE, LABSPEC, PHURINE, GLUCOSEU, HGBUR, BILIRUBINUR, KETONESUR, PROTEINUR, UROBILINOGEN, NITRITE, LEUKOCYTESUR in the last 72 hours.  Invalid input(s): APPERANCEUR    Imaging: No results found.   Medications:   . furosemide (LASIX) infusion 6 mg/hr (02/04/16 1034)   . albumin human  12.5 g Intravenous BID  . amoxicillin-clavulanate  1 tablet Oral Q12H  .  aspirin EC  81 mg Oral Daily  . atorvastatin  80 mg Oral Daily  . calcitRIOL  0.25 mcg Oral Q M,W,F  . cholecalciferol  1,000 Units Oral Daily  . enoxaparin (LOVENOX) injection  40 mg Subcutaneous Q12H  . feeding supplement (GLUCERNA SHAKE)  237 mL Oral TID BM  . feeding supplement (GLUCERNA SHAKE)  237 mL Oral TID BM  . fluticasone  1 spray Each Nare Daily  . gabapentin  300 mg Oral BID  . insulin aspart  0-20 Units Subcutaneous TID WC  . insulin aspart  0-5 Units Subcutaneous QHS  . insulin glargine  30 Units Subcutaneous QHS  . latanoprost  1 drop Both Eyes QHS  . levothyroxine  137 mcg Oral Once per day on Sun Mon Tue Wed Thu Sat   And  . levothyroxine  274 mcg Oral Once per day on Fri  . mometasone-formoterol  2 puff Inhalation BID  . montelukast  10 mg Oral QHS  . nystatin  5 mL Oral QID  . pantoprazole  40 mg Oral BID AC  . pneumococcal 23 valent vaccine  0.5 mL Intramuscular  Tomorrow-1000  . polyethylene glycol  17 g Oral Daily  . senna-docusate  2 tablet Oral Daily  . sodium chloride flush  10-40 mL Intracatheter Q12H  . sodium chloride flush  3 mL Intravenous Q12H  . venlafaxine  75 mg Oral BID   sodium chloride, acetaminophen, acetaminophen, albuterol, ALPRAZolam, alum & mag hydroxide-simeth, baclofen, bisacodyl, docusate sodium, naphazoline-glycerin, nitroGLYCERIN, ondansetron, promethazine, sodium chloride flush, sodium chloride flush  Assessment/ Plan:  82 y.o. female with diabetes mellitus type II, diabetic neuropathy, diabetic retinopathy, hypertension, glaucoma, hyperlipidemia, depression, recent left navicular fracture, who was admitted to [facility] on 01/27/2016   1. Acute Renal Failure on Chronic kidney disease stage III. Baseline creatinine of 1.4, eGFR of 34. Acute renal failure secondary to acute exacerbation of congestive heart failure and then overdiuresis. Chronic kidney disease from hypertension and diabetes.  S Cr at 1.5 -  Creatinine close to baseline now. Okay to continue furosemide drip in the setting of generalized edema. Serum bicarbonate is high and currently 47.  2. Anasarca/Generalized edema - Continue Lasix drip in combination with albumin for now. Good urine output of 2.1 L noted over the preceding 24 hours. Continue to monitor renal function closely as above.  3.  Diabetes Mellitus type II: with chronic kidney disease: hemoglobin A1c of 8.4% - low salt, carb restricted diet  4. Alkalosis -ABG shows normal pH of 7.43 with elevated PCO2 of 85. Therefore this is chronic compensation. Continue to monitor.    LOS: 9 ,  8/14/201711:18 AM

## 2016-02-05 NOTE — Progress Notes (Signed)
Physical Therapy Treatment Patient Details Name: Jill York MRN: [id number] DOB: 1931-06-09 Today's Date: 02/05/2016    History of Present Illness Pt is an 82 y/o female, who was sent to hospital from [facility], where she was receiving skilled PT following an ankle FX. She was admitted to the hospital due to confusion, hypotension, SOB, and rapid weight gain X 10lb. She is currently being treated for acute on CKD, anasarca, SOB, and other. Pt is PWB w/boot on L LE s/p L ankle Fx 12/19/15. PMH includes coronary angioplasty x4, COPD, CKD, CHF, and DM.    PT Comments    Pt O2 at 92% at rest while on 3.5L O2. Pt requires +2 mod assist for bed mobility and +2 max assist for transfers. Pt O2 dropped down to 88% with ther-ex, requires rest breaks between reps, ~20 seconds to increase to low 90s. Pt switched to 4L portable O2 for bed mobility and transfers. O2 sats decrease to 88% upon sitting but increase to low 90s within ~45 seconds. Pt requires mod verbal and tactile cueing for proper hand placement, unable to correct posterior lean with cueing. Demonstrates B knee buckling and weakness, about to stand for ~30 seconds before needing to sit down secondary to weakness and fatigue. Sit/stand transfer not attempted again due to O2 desaturation to 82%. PWB status on L LE reinforced and pursed lip breathing encouraged throughout session. Pt O2 sats 93% at end of session. Pt is slowly progressing towards her goals and will continue to benefit from skilled PT services in order to improve strength, balance, endurance, functional mobility, and safety with DME use. Pt continues to be appropriate for STR to address deficits.    Follow Up Recommendations  SNF     Equipment Recommendations  None recommended by PT    Recommendations for Other Services       Precautions / Restrictions Precautions Precautions: Fall Restrictions Weight Bearing Restrictions: Yes LLE Weight Bearing: Partial weight  bearing LLE Partial Weight Bearing Percentage or Pounds: 50% due to L ankle Fx    Mobility  Bed Mobility Overal bed mobility: Needs Assistance Bed Mobility: Supine to Sit;Sit to Supine     Supine to sit: Mod assist;+2 for physical assistance Sit to supine: +2 for physical assistance;Mod assist   General bed mobility comments: Pt able to scoot LE close to EOB, requires assistance to lift trunk off of bed. Verbal and tactile cueing to prevent posterior lean.   Transfers Overall transfer level: Needs assistance Equipment used: Rolling walker (2 wheeled) Transfers: Sit to/from Stand Sit to Stand: Max assist;+2 physical assistance         General transfer comment: Pt requires cueing for proper hand placement on RW and for proper technique. Pt B knees buckling, able to stand for ~30 before needing to sitting.   Ambulation/Gait             General Gait Details: Pt too weak to take a step, SOB noted as well.   Stairs            Wheelchair Mobility    Modified Rankin (Stroke Patients Only)       Balance Overall balance assessment: Needs assistance Sitting-balance support: Bilateral upper extremity supported;Feet supported Sitting balance-Leahy Scale: Fair Sitting balance - Comments: Pt requires min assist to maintain sitting balance, verbal and tactile cueing to decrease posterior lean. O2 sats 88% upon sitting, increased to 92% within 45 seconds.  Postural control: Posterior lean Standing balance support: Bilateral upper  extremity supported Standing balance-Leahy Scale: Poor Standing balance comment: Pt able to maintain standing balance with +2 mod assist and UE support on RW, B knee buckling and pt demonstrates significant posterior lean, unable to correct with verbal or tactile cueing.                     Cognition Arousal/Alertness: Awake/alert Behavior During Therapy: Anxious Overall Cognitive Status: Within Functional Limits for tasks assessed                       Exercises Other Exercises Other Exercises: Supine ther ex: B LE ankle pumps, quad sets, heel slides, hip abd/add, and SLRs x 12 reps with verbal cueing and supervision for proper technique.    General Comments        Pertinent Vitals/Pain Pain Assessment: Faces Faces Pain Scale: Hurts little more Pain Intervention(s): Limited activity within patient's tolerance;Monitored during session    Home Living                      Prior Function            PT Goals (current goals can now be found in the care plan section) Acute Rehab PT Goals Patient Stated Goal: To feel better PT Goal Formulation: With patient Time For Goal Achievement: 02/13/16 Potential to Achieve Goals: Good Progress towards PT goals: Progressing toward goals    Frequency  Min 2X/week    PT Plan Current plan remains appropriate    Co-evaluation             End of Session Equipment Utilized During Treatment: Gait belt;Oxygen (4L O2) Activity Tolerance: Patient limited by fatigue Patient left: in bed;with call bell/phone within reach;with bed alarm set     Time: [id number] PT Time Calculation (min) (ACUTE ONLY): 38 min  Charges:                       G Codes:      Georgina Pillion 2016/02/19, 4:03 PM Georgina Pillion, SPT [phone]

## 2016-02-05 NOTE — Progress Notes (Signed)
Patient RIJ inadvertently removed. Md notified. New IV placed in left hand. Critical values reported to MD. Acknowledged. Will continue to monitor.

## 2016-02-05 NOTE — Care Management Important Message (Signed)
Important Message  Patient Details  Name: Jill York MRN: [id number] Date of Birth: 17-Aug-1930   Medicare Important Message Given:  Yes    Katrina Stack, RN 02/05/2016, 10:21 AM

## 2016-02-05 NOTE — Care Management (Addendum)
Barrier to discharge- lasix drip and IV albumin.  Case may benefit from palliative care input.

## 2016-02-05 NOTE — Progress Notes (Signed)
Inpatient Diabetes Program Recommendations  AACE/ADA: New Consensus Statement on Inpatient Glycemic Control (2015)  Target Ranges:  Prepandial:   less than 140 mg/dL      Peak postprandial:   less than 180 mg/dL (1-2 hours)      Critically ill patients:  140 - 180 mg/dL   Results for ALVADA, OLANO (MRN [id number]) as of 02/05/2016 09:44  Ref. Range 02/04/2016 08:02 02/04/2016 11:53 02/04/2016 16:52 02/04/2016 21:25 02/05/2016 07:53  Glucose-Capillary Latest Ref Range: 65 - 99 mg/dL 184 (H) 240 (H) 225 (H) 316 (H) 222 (H)   Review of Glycemic Control  Diabetes history:DM2 Outpatient Diabetes medications: Lantus 54 units QHS, Humalog 20 units with breakfast, 26 units with lunch, 20 units with supper Current orders for Inpatient glycemic control: Lantus 30 units QHS, Novolog 0-20 units TID with meals, Novolog 0-5 units QHS  Inpatient Diabetes Program Recommendations: Insulin - Basal: Please consider increasing Lantus to 33 units QHS. Insulin-Meal Coverage: Post prandial glucose is consistently elevated. Please consider ordering Novolog 5 units TID with meals for meal coverage.  Thanks, Barnie Alderman, RN, MSN, CDE Diabetes Coordinator Inpatient Diabetes Program [phone] (Team Pager from [place] to [place]) [phone] ([place] office) [phone] [facility] office) [phone] [facility] office)

## 2016-02-05 NOTE — Progress Notes (Signed)
[place] at [place] NAME: Jill York    MR#:  [id number]  DATE OF BIRTH:  08/01/1930  SUBJECTIVE: Admitted for shortness of breath   CHIEF COMPLAINT:   Chief Complaint  Patient presents with  . Respiratory Distress     on lasix drip, diuresed well  feels better   Still have some swelling on legs, thighs, abdominal and back walls.   She has urinary incontinence so unable to measure    showed significant drop in body weight.   Also noted to have some redness on her right side breast and the lower abdominal wall mainly on the left side.   C/o not eating good.   Lost PICC line 02/05/16  REVIEW OF SYSTEMS:   Review of Systems  Constitutional: Positive for malaise/fatigue. Negative for chills, fever and weight loss.  HENT: Negative for nosebleeds and sore throat.   Eyes: Negative for blurred vision.  Respiratory: Positive for shortness of breath. Negative for cough and wheezing.   Cardiovascular: Positive for leg swelling and PND. Negative for chest pain and orthopnea.  Gastrointestinal: Negative for abdominal pain, constipation, diarrhea, heartburn, nausea and vomiting.  Genitourinary: Negative for dysuria and urgency.  Musculoskeletal: Negative for back pain.  Skin: Negative for rash.  Neurological: Positive for weakness. Negative for dizziness, speech change, focal weakness and headaches.  Endo/Heme/Allergies: Does not bruise/bleed easily.  Psychiatric/Behavioral: Positive for memory loss. Negative for depression.    DRUG ALLERGIES:   Allergies  Allergen Reactions  . Amlodipine Swelling    Leg swelling only   . Oxycodone Other (See Comments)    Pt states she was"out " for 2 hours and didnt notice people in the area   . Demerol [Meperidine] Diarrhea and Nausea And Vomiting  . Lisinopril Cough  . Tape Rash    VITALS:  Blood pressure (!) 123/47, pulse 89, temperature 97.9 F (36.6 C), temperature source Oral, resp.  rate 20, height 5' 4"  (1.626 m), weight 110.6 kg (243 lb 12.8 oz), SpO2 98 %. PHYSICAL EXAMINATION:  GENERAL:  82 y.o.-year-old patient lying in the bed with no acute distress.  EYES: Pupils equal, round, reactive to light and accommodation. No scleral icterus. Extraocular muscles intact.  HEENT: Head atraumatic, normocephalic. Oropharynx and nasopharynx clear.  NECK:  Supple, no jugular venous distention. No thyroid enlargement, no tenderness.  LUNGS: Normal breath sounds bilaterally, no wheezing, rales,rhonchi or crepitation. No use of accessory muscles of respiration.  CARDIOVASCULAR: S1, S2 normal. No murmurs, rubs, or gallops.  ABDOMEN: Soft, nontender, nondistended. Bowel sounds present. No organomegaly or mass.  EXTREMITIES: pedal edema  better, no cyanosis, or clubbing.  Generalized anasarca + NEUROLOGIC: Cranial nerves II through XII are intact. Muscle strength 5/5 in all extremities. Sensation intact. Gait not checked.  PSYCHIATRIC: The patient is alert and oriented x 3.  SKIN: minimal redness on right breast and on abdominal wall.  LABORATORY PANEL:   CBC  Recent Labs Lab 02/04/16 0426  WBC 4.8  HGB 8.0*  HCT 24.8*  PLT 104*   ------------------------------------------------------------------------------------------------------------------  Chemistries   Recent Labs Lab 02/05/16 0536  NA 144  K 3.4*  CL 92*  CO2 47*  GLUCOSE 228*  BUN 32*  CREATININE 1.55*  CALCIUM 9.0  AST 15  ALT 14  ALKPHOS 72  BILITOT 1.1   ------------------------------------------------------------------------------------------------------------------  Cardiac Enzymes  Recent Labs Lab 01/29/16 1647  TROPONINI 0.08*   ------------------------------------------------------------------------------------------------------------------  RADIOLOGY:  No results found. ASSESSMENT AND PLAN:  Jill York is a 82 y.o. white female with diabetes mellitus type II, diabetic  neuropathy, diabetic retinopathy, hypertension, glaucoma, hyperlipidemia, depression, recent left navicular fracture, who was admitted to [facility] on 01/27/2016 for worsening shortness of breath  * Generalized anasarca 2/2 cardio renal syn  -improving on lasix drip Monitor intake and output- not monitored well as patient has incontinence.   But pt symptomatically getting better.   As per the body weight measurement she has dropped 13 KG since admission.  * Acute Renal Failure on Chronic kidney disease stage III with proteinuria baseline creatinine of 1.4, eGFR of 34 Cr 2.6 -2.05- 1.48 - Could be due to cardiorenal syndrome, nephrology following.   - Improving on lasix drip  * Anxiety- xanax  * Hyperkalemia - resolved s/p Kayexalate  * Acute exacerbation of systolic and diastolic congestive heart failure: with anasarca.  - Initial chest x-ray showed pulmonary edema - Previous echo in July 2016 showed EF of 45-50% - cardio following, appreciate his help.  * Cellulitis on Right breast and abdominal wall.   Added augmentin 02/02/16.  * Diabetes Mellitus type II: with chronic kidney disease: hemoglobin A1c of 8.4%   On lantus and SCC.   Increased doses as blood sugar was high.  * Secondary Hyperparathyroidism: on calcitriol. PTH 77 from 12/13/15.   * Gen weakness - PT consult, may need placement.  * Decreased oral intake and weakness   Glucerna and PT eval.   All the records are reviewed and case discussed with Care Management/Social Worker. Management plans discussed with the patient, family and they are in agreement.  CODE STATUS: DNR  TOTAL TIME TAKING CARE OF THIS PATIENT: 35 minutes.   POSSIBLE D/C IN 2- DAYS, DEPENDING ON CLINICAL CONDITION.   Vaughan Basta M.D on 02/05/2016 at 4:05 PM  Between 7am to 6pm - Pager - [phone]  After 6pm go to [email or web address] - password [email or web address] [facility]  Office  [phone]  CC: Primary care  physician; Sharyne Peach, MD   Note: This dictation was prepared with Dragon dictation along with smaller phrase technology. Any transcriptional errors that result from this process are unintentional.

## 2016-02-06 ENCOUNTER — Encounter: Payer: Self-pay | Admitting: Internal Medicine

## 2016-02-06 LAB — BASIC METABOLIC PANEL
Anion gap: 4 — ABNORMAL LOW (ref 5–15)
BUN: 35 mg/dL — AB (ref 6–20)
CALCIUM: 8.8 mg/dL — AB (ref 8.9–10.3)
CHLORIDE: 93 mmol/L — AB (ref 101–111)
CO2: 48 mmol/L — ABNORMAL HIGH (ref 22–32)
CREATININE: 1.84 mg/dL — AB (ref 0.44–1.00)
GFR calc non Af Amer: 24 mL/min — ABNORMAL LOW (ref >60)
GFR, EST AFRICAN AMERICAN: 28 mL/min — AB (ref >60)
Glucose, Bld: 188 mg/dL — ABNORMAL HIGH (ref 65–99)
Potassium: 3.6 mmol/L (ref 3.5–5.1)
SODIUM: 145 mmol/L (ref 135–145)

## 2016-02-06 LAB — GLUCOSE, CAPILLARY
GLUCOSE-CAPILLARY: 182 mg/dL — AB (ref 65–99)
GLUCOSE-CAPILLARY: 124 mg/dL — AB (ref 65–99)
Glucose-Capillary: 123 mg/dL — ABNORMAL HIGH (ref 65–99)
Glucose-Capillary: 157 mg/dL — ABNORMAL HIGH (ref 65–99)

## 2016-02-06 LAB — CBC
HCT: 22.7 % — ABNORMAL LOW (ref 35.0–47.0)
HEMOGLOBIN: 7.2 g/dL — AB (ref 12.0–16.0)
MCH: 29.1 pg (ref 26.0–34.0)
MCHC: 31.5 g/dL — AB (ref 32.0–36.0)
MCV: 92.3 fL (ref 80.0–100.0)
PLATELETS: 112 10*3/uL — AB (ref 150–440)
RBC: 2.46 MIL/uL — ABNORMAL LOW (ref 3.80–5.20)
RDW: 19.5 % — AB (ref 11.5–14.5)
WBC: 4.7 10*3/uL (ref 3.6–11.0)

## 2016-02-06 LAB — HEMOGLOBIN: Hemoglobin: 7 g/dL — ABNORMAL LOW (ref 12.0–16.0)

## 2016-02-06 LAB — PREPARE RBC (CROSSMATCH)

## 2016-02-06 MED ORDER — SODIUM CHLORIDE 0.9 % IV SOLN
Freq: Once | INTRAVENOUS | Status: AC
  Administered 2016-02-06: 22:00:00 via INTRAVENOUS

## 2016-02-06 MED ORDER — TRAMADOL HCL 50 MG PO TABS
100.0000 mg | ORAL_TABLET | Freq: Once | ORAL | Status: DC
  Filled 2016-02-06: qty 2

## 2016-02-06 MED ORDER — HYDROCODONE-ACETAMINOPHEN 5-325 MG PO TABS
1.0000 | ORAL_TABLET | Freq: Once | ORAL | Status: DC
  Filled 2016-02-06: qty 1

## 2016-02-06 MED ORDER — AMOXICILLIN-POT CLAVULANATE 500-125 MG PO TABS
1.0000 | ORAL_TABLET | Freq: Two times a day (BID) | ORAL | Status: DC
  Administered 2016-02-06 – 2016-02-07 (×2): 500 mg via ORAL
  Filled 2016-02-06 (×2): qty 1

## 2016-02-06 MED ORDER — ENOXAPARIN SODIUM 40 MG/0.4ML ~~LOC~~ SOLN
40.0000 mg | SUBCUTANEOUS | Status: DC
  Administered 2016-02-07 – 2016-02-08 (×2): 40 mg via SUBCUTANEOUS
  Filled 2016-02-06 (×2): qty 0.4

## 2016-02-06 NOTE — Care Management (Signed)
Requested [facility] evaluate patient for LTACH. She was not found to be appropriate.

## 2016-02-06 NOTE — Progress Notes (Signed)
Dr. Anselm Jungling was notified of patient's hemoglobin of 7. MD ordered 1 unit of PRBC. Type and screen has been drawn and consent has been signed. Patient educated and has had transfusions before, no hx of reaction. Lasix drip still continued at 54mL/hr. Patient has only had one episode of incontinence and 648mL of output for this shift. Still wearing 2L of oxygen. Originally Dr. Anselm Jungling stated to remove triple IJ central line to decrease infection risk, but patient does not have a peripheral line that can be used. Patient has had lymph nodes removed in her left arm, so left hand peripheral IV was removed. Unable to find access in patient's right arm. Will keep triple lumen to continue lasix drip and give blood, MD is aware.

## 2016-02-06 NOTE — Progress Notes (Signed)
Anticoagulation monitoring(Lovenox):  82 yo  female ordered Lovenox 40 mg Q12h  Filed Weights   02/04/16 0521 02/05/16 0345 02/06/16 0425  Weight: 247 lb 12.8 oz (112.4 kg) 243 lb 12.8 oz (110.6 kg) 243 lb 11.2 oz (110.5 kg)   BMI  44  Lab Results  Component Value Date   CREATININE 1.84 (H) 02/06/2016   CREATININE 1.55 (H) 02/05/2016   CREATININE 1.54 (H) 02/04/2016   Estimated Creatinine Clearance: 28.2 mL/min (by C-G formula based on SCr of 1.84 mg/dL). Hemoglobin & Hematocrit     Component Value Date/Time   HGB 7.2 (L) 02/06/2016 0500   HGB 9.0 (L) 10/20/2014 0920   HCT 22.7 (L) 02/06/2016 0500   HCT 28.1 (L) 10/20/2014 0920     Per Protocol for Patient with estCrcl < 30 ml/min and BMI > 40, will transition to Lovenox 40 mg Q24h  Talene Glastetter D Adaly Puder, Pharm.D Clinical Pharmacist .

## 2016-02-06 NOTE — Progress Notes (Signed)
Subjective:  Patient continued on Lasix drip. Creatinine up slightly to 1.8. Urine output yesterday was documented only as 350 cc. Weight overall has been stable over the past 2 days.  Objective:  Vital signs in last 24 hours:  Temp:  [97.5 F (36.4 C)-97.8 F (36.6 C)] 97.7 F (36.5 C) (08/15 1112) Pulse Rate:  [78-88] 87 (08/15 1112) Resp:  [16-22] 16 (08/15 1112) BP: (123-152)/(47-66) 123/64 (08/15 1112) SpO2:  [84 %-98 %] 84 % (08/15 1112) Weight:  [110.5 kg (243 lb 11.2 oz)] 110.5 kg (243 lb 11.2 oz) (08/15 0425)  Weight change: -0.045 kg (-1.6 oz) Filed Weights   02/04/16 0521 02/05/16 0345 02/06/16 0425  Weight: 112.4 kg (247 lb 12.8 oz) 110.6 kg (243 lb 12.8 oz) 110.5 kg (243 lb 11.2 oz)    Intake/Output:    Intake/Output Summary (Last 24 hours) at 02/06/16 1202 Last data filed at 02/06/16 0700  Gross per 24 hour  Intake              668 ml  Output              350 ml  Net              318 ml     Physical Exam: General: No acute distress, lying in the bed   HEENT Anicteric, moist oral mucous membranes   Neck Supple   Pulm/lungs Bilateral crackles at bases, oxygen by nasal cannula   CVS/Heart No rub, regular with ectopic beats   Abdomen:  Distended, BS present  Extremities: 2+ pitting edema up to thighs and lower abdomen , + arms  Neurologic: Alert, oriented   Skin: No acute rashes           Basic Metabolic Panel:   Recent Labs Lab 02/02/16 0500 02/03/16 0443 02/04/16 0426 02/05/16 0536 02/06/16 0500  NA 146* 145 145 144 145  K 3.5 3.5 3.5 3.4* 3.6  CL 98* 94* 94* 92* 93*  CO2 43* 46* 45* 47* 48*  GLUCOSE 82 179* 224* 228* 188*  BUN 39* 39* 32* 32* 35*  CREATININE 1.57* 1.50* 1.54* 1.55* 1.84*  CALCIUM 8.4* 8.4* 8.8* 9.0 8.8*     CBC:  Recent Labs Lab 02/02/16 0500 02/03/16 0443 02/04/16 0426 02/06/16 0500  WBC 5.7 5.5 4.8 4.7  HGB 8.0* 7.8* 8.0* 7.2*  HCT 25.2* 24.5* 24.8* 22.7*  MCV 93.1 91.6 91.5 92.3  PLT 101* 108* 104*  112*      Microbiology:  Recent Results (from the past 720 hour(s))  MRSA PCR Screening     Status: None   Collection Time: 01/12/16  3:24 PM  Result Value Ref Range Status   MRSA by PCR NEGATIVE NEGATIVE Final    Comment:        The GeneXpert MRSA Assay (FDA approved for NASAL specimens only), is one component of a comprehensive MRSA colonization surveillance program. It is not intended to diagnose MRSA infection nor to guide or monitor treatment for MRSA infections.     Coagulation Studies: No results for input(s): LABPROT, INR in the last 72 hours.  Urinalysis: No results for input(s): COLORURINE, LABSPEC, PHURINE, GLUCOSEU, HGBUR, BILIRUBINUR, KETONESUR, PROTEINUR, UROBILINOGEN, NITRITE, LEUKOCYTESUR in the last 72 hours.  Invalid input(s): APPERANCEUR    Imaging: Dg Chest Port 1 View  Result Date: 02/05/2016 CLINICAL DATA:  Catheter placement EXAM: PORTABLE CHEST 1 VIEW COMPARISON:  Chest radiograph 01/30/2016 FINDINGS: Right internal jugular central venous catheter has been withdrawn slightly with tip  in the superior vena cava. No PICC line is visualized. Unchanged appearance of the enlarged cardiomediastinal silhouette with atherosclerotic calcification within the aortic arch. Small left pleural effusion, slightly greater than on the prior study. Streaky perihilar opacities suggestive of interstitial edema are unchanged from the prior study. No pneumothorax. IMPRESSION: 1. No PICC line identified. 2. Slight withdrawal of right internal jugular central venous catheter with tip now in the SVC. 3. Unchanged cardiomegaly and interstitial edema. Electronically Signed   By: Ulyses Jarred M.D.   On: 02/05/2016 17:16     Medications:   . furosemide (LASIX) infusion 6 mg/hr (02/04/16 1034)   . amoxicillin-clavulanate  1 tablet Oral Q12H  . aspirin EC  81 mg Oral Daily  . atorvastatin  80 mg Oral Daily  . calcitRIOL  0.25 mcg Oral Q M,W,F  . cholecalciferol  1,000 Units  Oral Daily  . [START ON 02/07/2016] enoxaparin (LOVENOX) injection  40 mg Subcutaneous Q24H  . feeding supplement (GLUCERNA SHAKE)  237 mL Oral TID BM  . fluticasone  1 spray Each Nare Daily  . gabapentin  300 mg Oral BID  . HYDROcodone-acetaminophen  1 tablet Oral Once  . insulin aspart  0-20 Units Subcutaneous TID WC  . insulin aspart  0-5 Units Subcutaneous QHS  . insulin aspart  5 Units Subcutaneous TID WC  . insulin glargine  33 Units Subcutaneous QHS  . latanoprost  1 drop Both Eyes QHS  . levothyroxine  137 mcg Oral Once per day on Sun Mon Tue Wed Thu Sat   And  . levothyroxine  274 mcg Oral Once per day on Fri  . mometasone-formoterol  2 puff Inhalation BID  . montelukast  10 mg Oral QHS  . nystatin  5 mL Oral QID  . pantoprazole  40 mg Oral BID AC  . pneumococcal 23 valent vaccine  0.5 mL Intramuscular Tomorrow-1000  . polyethylene glycol  17 g Oral Daily  . potassium chloride  20 mEq Oral Daily  . senna-docusate  2 tablet Oral Daily  . sodium chloride flush  10-40 mL Intracatheter Q12H  . sodium chloride flush  3 mL Intravenous Q12H  . traMADol  100 mg Oral Once  . venlafaxine  75 mg Oral BID   sodium chloride, acetaminophen, acetaminophen, albuterol, ALPRAZolam, alum & mag hydroxide-simeth, baclofen, bisacodyl, docusate sodium, naphazoline-glycerin, nitroGLYCERIN, ondansetron, promethazine, sodium chloride flush, sodium chloride flush  Assessment/ Plan:  82 y.o. female with diabetes mellitus type II, diabetic neuropathy, diabetic retinopathy, hypertension, glaucoma, hyperlipidemia, depression, recent left navicular fracture, who was admitted to [facility] on 01/27/2016   1. Acute Renal Failure on Chronic kidney disease stage III. Baseline creatinine of 1.4, eGFR of 34. Acute renal failure secondary to acute exacerbation of congestive heart failure and then overdiuresis. Chronic kidney disease from hypertension and diabetes.  -  Creatinine higher today at 1.8.  However patient  still has considerable edema. Continue Lasix drip for 1 additional day and consider stopping this tomorrow.  2. Anasarca/Generalized edema - Patient has received both albumin and Lasix drip. Continue Lasix for 1 additional day. Albumin now up to 3.5.  3.  Diabetes Mellitus type II: with chronic kidney disease: hemoglobin A1c of 8.4% - low salt, carb restricted diet  4. Alkalosis -ABG shows normal pH of 7.43 with elevated PCO2 of 85. Therefore this is chronic compensation. Continue to monitor.    LOS: 10 Jill York 8/15/201712:02 PM

## 2016-02-06 NOTE — Progress Notes (Signed)
[place] at [place] NAME: Jill York    MR#:  [id number]  DATE OF BIRTH:  [birth year]  SUBJECTIVE: Admitted for shortness of breath   CHIEF COMPLAINT:   Chief Complaint  Patient presents with  . Respiratory Distress     on lasix drip, diuresed well  feels better   Still have some swelling on legs, thighs, abdominal and back walls.   She has urinary incontinence so unable to measure    showed significant drop in body weight.   Also noted to have some redness on her right side breast and the lower abdominal wall mainly on the left side.   Much better.  REVIEW OF SYSTEMS:   Review of Systems  Constitutional: Positive for malaise/fatigue. Negative for chills, fever and weight loss.  HENT: Negative for nosebleeds and sore throat.   Eyes: Negative for blurred vision.  Respiratory: Positive for shortness of breath. Negative for cough and wheezing.   Cardiovascular: Positive for leg swelling and PND. Negative for chest pain and orthopnea.  Gastrointestinal: Negative for abdominal pain, constipation, diarrhea, heartburn, nausea and vomiting.  Genitourinary: Negative for dysuria and urgency.  Musculoskeletal: Negative for back pain.  Skin: Negative for rash.  Neurological: Positive for weakness. Negative for dizziness, speech change, focal weakness and headaches.  Endo/Heme/Allergies: Does not bruise/bleed easily.  Psychiatric/Behavioral: Positive for memory loss. Negative for depression.    DRUG ALLERGIES:   Allergies  Allergen Reactions  . Amlodipine Swelling    Leg swelling only   . Oxycodone Other (See Comments)    Pt states she was"out " for 2 hours and didnt notice people in the area   . Demerol [Meperidine] Diarrhea and Nausea And Vomiting  . Lisinopril Cough  . Tape Rash    VITALS:  Blood pressure 123/64, pulse 87, temperature 97.7 F (36.5 C), temperature source Oral, resp. rate 16, height _0  (1.626 m), weight  110.5 kg (243 lb 11.2 oz), SpO2 98 %. PHYSICAL EXAMINATION:  GENERAL:  82 y.o.-year-old patient lying in the bed with no acute distress.  EYES: Pupils equal, round, reactive to light and accommodation. No scleral icterus. Extraocular muscles intact.  HEENT: Head atraumatic, normocephalic. Oropharynx and nasopharynx clear.  NECK:  Supple, no jugular venous distention. No thyroid enlargement, no tenderness.  LUNGS: Normal breath sounds bilaterally, no wheezing, rales,rhonchi or crepitation. No use of accessory muscles of respiration.  CARDIOVASCULAR: S1, S2 normal. No murmurs, rubs, or gallops.  ABDOMEN: Soft, nontender, nondistended. Bowel sounds present. No organomegaly or mass.  EXTREMITIES: pedal edema  better, no cyanosis, or clubbing.  Generalized anasarca getting better. NEUROLOGIC: Cranial nerves II through XII are intact. Muscle strength 5/5 in all extremities. Sensation intact. Gait not checked.  PSYCHIATRIC: The patient is alert and oriented x 3.  SKIN: no redness now.  LABORATORY PANEL:   CBC  Recent Labs Lab 02/06/16 0500  WBC 4.7  HGB 7.2*  HCT 22.7*  PLT 112*   ------------------------------------------------------------------------------------------------------------------  Chemistries   Recent Labs Lab 02/05/16 0536 02/06/16 0500  NA 144 145  K 3.4* 3.6  CL 92* 93*  CO2 47* 48*  GLUCOSE 228* 188*  BUN 32* 35*  CREATININE 1.55* 1.84*  CALCIUM 9.0 8.8*  AST 15  --   ALT 14  --   ALKPHOS 72  --   BILITOT 1.1  --    ------------------------------------------------------------------------------------------------------------------  Cardiac Enzymes No results for input(s): TROPONINI in the last 168 hours. ------------------------------------------------------------------------------------------------------------------  RADIOLOGY:  Dg Chest Port 1 View  Result Date: 02/05/2016 CLINICAL DATA:  Catheter placement EXAM: PORTABLE CHEST 1 VIEW COMPARISON:   Chest radiograph 01/30/2016 FINDINGS: Right internal jugular central venous catheter has been withdrawn slightly with tip in the superior vena cava. No PICC line is visualized. Unchanged appearance of the enlarged cardiomediastinal silhouette with atherosclerotic calcification within the aortic arch. Small left pleural effusion, slightly greater than on the prior study. Streaky perihilar opacities suggestive of interstitial edema are unchanged from the prior study. No pneumothorax. IMPRESSION: 1. No PICC line identified. 2. Slight withdrawal of right internal jugular central venous catheter with tip now in the SVC. 3. Unchanged cardiomegaly and interstitial edema. Electronically Signed   By: Ulyses Jarred M.D.   On: 02/05/2016 17:16   ASSESSMENT AND PLAN:  [name] is a 82 y.o. white female with diabetes mellitus type II, diabetic neuropathy, diabetic retinopathy, hypertension, glaucoma, hyperlipidemia, depression, recent left navicular fracture, who was admitted to [facility] on 01/27/2016 for worsening shortness of breath  * Generalized anasarca 2/2 cardio renal syn  -improving on lasix drip  intake and output- not monitored well as patient has incontinence.   But pt symptomatically getting better.   As per the body weight measurement she has dropped 13 KG since admission.  as per nephrology- continue one more day.  * Acute Renal Failure on Chronic kidney disease stage III with proteinuria baseline creatinine of 1.4, eGFR of 34 Cr 2.6 -2.05- 1.48 - Could be due to cardiorenal syndrome, nephrology following.   - stable now.  * Anxiety- xanax  * Hyperkalemia - resolved s/p Kayexalate  * Acute exacerbation of systolic and diastolic congestive heart failure: with anasarca.  - Initial chest x-ray showed pulmonary edema - Previous echo in July 2016 showed EF of 45-50% - cardio following, appreciate his help.  * Cellulitis on Right breast and abdominal wall.   Added augmentin 02/02/16. May  give total 7 days.  * Diabetes Mellitus type II: with chronic kidney disease: hemoglobin A1c of 8.4%   On lantus and SCC.   Increased doses as blood sugar was high.  * Secondary Hyperparathyroidism: on calcitriol. PTH 77 from 12/13/15.   * Gen weakness - PT consult, may need placement.  * Decreased oral intake and weakness   Glucerna and PT eval.- need rehab.  * Lymphoma   Known in breast for >10 years, now in duodenum as per recent pathology report.( July 2017)   Discussed with son about prognosis, and offered pallaitive care consult.  All the records are reviewed and case discussed with Care Management/Social Worker. Management plans discussed with the patient, family and they are in agreement.  CODE STATUS: DNR  TOTAL TIME TAKING CARE OF THIS PATIENT: 35 minutes.   POSSIBLE D/C IN 2- DAYS, DEPENDING ON CLINICAL CONDITION.   Vaughan Basta M.D on 02/06/2016 at 5:09 PM  Between 7am to 6pm - Pager - [phone]  After 6pm go to [email or web address] - password [email or web address] [facility]  Office  [phone]  CC: Primary care physician; Sharyne Peach, MD   Note: This dictation was prepared with Dragon dictation along with smaller phrase technology. Any transcriptional errors that result from this process are unintentional.

## 2016-02-06 NOTE — Clinical Social Work Note (Signed)
MSW met with patient and her son to confirm patient's plan is to return back to [facility].  Patient and her son stated they are holding patient's room at are planning to return back to SNF once she is medically ready for discharge and orders have been received.  MSW to continue to follow patient's progress throughout discharge planning.  MSW updated [facility] and informed them patient plans to return back to facility once she is ready.  Jones Broom. Alfredo Spong, MSW [phone]  Mon-Fri 8a-4:30p 02/06/2016 12:09 PM

## 2016-02-06 NOTE — Progress Notes (Signed)
Physical Therapy Treatment Patient Details Name: Jill York MRN: [id number] DOB: May 14, 1931 Today's Date: 02/06/2016    History of Present Illness Pt is an 82 y/o female, who was sent to hospital from [facility], where she was receiving skilled PT following an ankle FX. She was admitted to the hospital due to confusion, hypotension, SOB, and rapid weight gain X 10lb. She is currently being treated for acute on CKD, anasarca, SOB, and other. Pt is PWB w/boot on L LE s/p L ankle Fx 12/19/15. PMH includes coronary angioplasty x4, COPD, CKD, CHF, and DM.    PT Comments    Pt is progressing towards goals. She tolerated ther-ex well with vitals WFL (HR~80-100 and spO2 88-95%). Pt transferred to sitting on EOB with max assist X 1 and reported it felt good to sit on EOB; says she wants to do it more often. Pt stood at EOB X 1 min with RW and max assist, once standing she was mod assist to maintain standing. Pt voided at EOB before she was transferred to bed pan. Pt was given ther-ex booklet with detailed instructions for use; she verbalized and demonstrated understanding. Pt is appropriate for continued PT at this time to address deficits in strength, balance, coordination, endurance, and activity tolerance.    Follow Up Recommendations  SNF     Equipment Recommendations  None recommended by PT    Recommendations for Other Services       Precautions / Restrictions Precautions Precautions: Fall Restrictions Weight Bearing Restrictions: Yes LLE Weight Bearing: Partial weight bearing LLE Partial Weight Bearing Percentage or Pounds: 50% due to L ankle Fx    Mobility  Bed Mobility Overal bed mobility: Needs Assistance Bed Mobility: Supine to Sit;Sit to Supine     Supine to sit: Max assist Sit to supine: Mod assist   General bed mobility comments: Pt able to scoot LE close to EOB, requires assistance to lift trunk off of bed. Verbal and tactile cueing to prevent posterior lean.    Transfers Overall transfer level: Needs assistance Equipment used: Rolling walker (2 wheeled) Transfers: Sit to/from Stand Sit to Stand: Max assist         General transfer comment: Pt requires cueing for proper hand placement on RW and for proper technique. Pt B knees buckling, able to stand for 1 minute before needing to sitting.   Ambulation/Gait             General Gait Details: Not assessed due to cardiopulmonary status and general weakness   Stairs            Wheelchair Mobility    Modified Rankin (Stroke Patients Only)       Balance Overall balance assessment: Needs assistance Sitting-balance support: No upper extremity supported;Feet supported Sitting balance-Leahy Scale: Good Sitting balance - Comments: Pt tolerated mild perturbation in all directions w/o UE support   Standing balance support: Bilateral upper extremity supported Standing balance-Leahy Scale: Poor Standing balance comment: B knees buckling; requires cuing for hand and foot placement and mod assist to stay standing                    Cognition Arousal/Alertness: Awake/alert Behavior During Therapy: WFL for tasks assessed/performed Overall Cognitive Status: Within Functional Limits for tasks assessed                      Exercises General Exercises - Lower Extremity Ankle Circles/Pumps: 15 reps;Both;Strengthening;Supine (with manual resistance and verbal cuing)  Quad Sets: 15 reps;Both;AROM;Supine (with mis assist and verbal cuing) Gluteal Sets: 15 reps;Both;AROM;Supine (with min assist and verbal cuing) Heel Slides: 15 reps;Both;Strengthening;Supine (with manual resistance and verbal cuing) Other Exercises Other Exercises: (P) Sitting balance training X 5 minutes with multidirectional perturbation. Pt tolerated well. Pt tolerated standing X 1 minute with tactile and verbal cues, and transfer training X 5 minutes with verbal instruction and max assist; pt was  cooperative    General Comments General comments (skin integrity, edema, etc.): Pt voided at EOB before realizing she needed bed pan; finished in bed pan      Pertinent Vitals/Pain Pain Assessment: Faces Faces Pain Scale: Hurts a little bit Pain Location: none specified Pain Descriptors / Indicators: Grimacing;Discomfort Pain Intervention(s): Limited activity within patient's tolerance;Monitored during session;Repositioned    Home Living                      Prior Function            PT Goals (current goals can now be found in the care plan section) Acute Rehab PT Goals Patient Stated Goal: To feel better PT Goal Formulation: With patient Time For Goal Achievement: 02/13/16 Potential to Achieve Goals: Good Progress towards PT goals: Progressing toward goals    Frequency  Min 2X/week    PT Plan Current plan remains appropriate    Co-evaluation             End of Session Equipment Utilized During Treatment: Gait belt;Oxygen Activity Tolerance: Patient limited by fatigue Patient left: in bed;with call bell/phone within reach;with nursing/sitter in room     Time: [id number] PT Time Calculation (min) (ACUTE ONLY): 31 min  Charges:  $Therapeutic Exercise: 8-22 mins $Neuromuscular Re-education: 8-22 mins                    G Codes:      Davanna He Feb 18, 2016, 5:21 PM  Burnett Corrente, SPT [phone]

## 2016-02-07 DIAGNOSIS — J441 Chronic obstructive pulmonary disease with (acute) exacerbation: Secondary | ICD-10-CM

## 2016-02-07 DIAGNOSIS — R531 Weakness: Secondary | ICD-10-CM

## 2016-02-07 DIAGNOSIS — Z789 Other specified health status: Secondary | ICD-10-CM

## 2016-02-07 DIAGNOSIS — Z66 Do not resuscitate: Secondary | ICD-10-CM

## 2016-02-07 DIAGNOSIS — I509 Heart failure, unspecified: Secondary | ICD-10-CM

## 2016-02-07 DIAGNOSIS — Z515 Encounter for palliative care: Secondary | ICD-10-CM

## 2016-02-07 LAB — TYPE AND SCREEN
ABO/RH(D): A POS
ANTIBODY SCREEN: NEGATIVE
UNIT DIVISION: 0

## 2016-02-07 LAB — CBC
HEMATOCRIT: 24.9 % — AB (ref 35.0–47.0)
HEMOGLOBIN: 7.9 g/dL — AB (ref 12.0–16.0)
MCH: 29.4 pg (ref 26.0–34.0)
MCHC: 31.6 g/dL — AB (ref 32.0–36.0)
MCV: 93 fL (ref 80.0–100.0)
Platelets: 124 10*3/uL — ABNORMAL LOW (ref 150–440)
RBC: 2.67 MIL/uL — AB (ref 3.80–5.20)
RDW: 19.4 % — ABNORMAL HIGH (ref 11.5–14.5)
WBC: 5.8 10*3/uL (ref 3.6–11.0)

## 2016-02-07 LAB — BASIC METABOLIC PANEL
Anion gap: 2 — ABNORMAL LOW (ref 5–15)
BUN: 36 mg/dL — AB (ref 6–20)
CHLORIDE: 92 mmol/L — AB (ref 101–111)
CO2: 48 mmol/L — AB (ref 22–32)
CREATININE: 1.85 mg/dL — AB (ref 0.44–1.00)
Calcium: 8.8 mg/dL — ABNORMAL LOW (ref 8.9–10.3)
GFR calc Af Amer: 28 mL/min — ABNORMAL LOW (ref >60)
GFR calc non Af Amer: 24 mL/min — ABNORMAL LOW (ref >60)
Glucose, Bld: 209 mg/dL — ABNORMAL HIGH (ref 65–99)
POTASSIUM: 4 mmol/L (ref 3.5–5.1)
Sodium: 142 mmol/L (ref 135–145)

## 2016-02-07 LAB — GLUCOSE, CAPILLARY
GLUCOSE-CAPILLARY: 184 mg/dL — AB (ref 65–99)
Glucose-Capillary: 157 mg/dL — ABNORMAL HIGH (ref 65–99)
Glucose-Capillary: 136 mg/dL — ABNORMAL HIGH (ref 65–99)
Glucose-Capillary: 192 mg/dL — ABNORMAL HIGH (ref 65–99)

## 2016-02-07 MED ORDER — AMOXICILLIN-POT CLAVULANATE 500-125 MG PO TABS
1.0000 | ORAL_TABLET | Freq: Two times a day (BID) | ORAL | Status: DC
  Administered 2016-02-07 – 2016-02-08 (×2): 500 mg via ORAL
  Filled 2016-02-07 (×3): qty 1

## 2016-02-07 MED ORDER — INSULIN GLARGINE 100 UNIT/ML ~~LOC~~ SOLN
35.0000 [IU] | Freq: Every day | SUBCUTANEOUS | Status: DC
  Administered 2016-02-07: 35 [IU] via SUBCUTANEOUS
  Filled 2016-02-07 (×2): qty 0.35

## 2016-02-07 NOTE — Progress Notes (Signed)
Patient is hemodynamically stable,no significant change this shift.

## 2016-02-07 NOTE — Progress Notes (Signed)
Palliative care met with the patient this morning. Continuous IV Lasix was discontinued today. Patient is Alert and oriented. No complaints of pain. Vitals are stable. Currently on 4 L of oxygen.

## 2016-02-07 NOTE — Progress Notes (Signed)
[facility] NAME: Jill York    MR#:  [id number]  DATE OF BIRTH:  1930/09/08  SUBJECTIVE:   Still on lasix gtt this am but feeling better this am Denies shortness of breath.  REVIEW OF SYSTEMS:    Review of Systems  Constitutional: Negative for chills, fever and malaise/fatigue.  HENT: Negative.  Negative for ear discharge, ear pain, hearing loss, nosebleeds and sore throat.   Eyes: Negative.  Negative for blurred vision and pain.  Respiratory: Positive for cough. Negative for hemoptysis, shortness of breath and wheezing.   Cardiovascular: Positive for leg swelling. Negative for chest pain, palpitations and PND.  Gastrointestinal: Negative.  Negative for abdominal pain, blood in stool, diarrhea, nausea and vomiting.  Genitourinary: Negative.  Negative for dysuria.  Musculoskeletal: Negative.  Negative for back pain.  Skin: Negative.   Neurological: Positive for weakness. Negative for dizziness, tremors, speech change, focal weakness, seizures and headaches.  Endo/Heme/Allergies: Negative.  Does not bruise/bleed easily.  Psychiatric/Behavioral: Negative.  Negative for depression, hallucinations and suicidal ideas.    Tolerating Diet:yes      DRUG ALLERGIES:   Allergies  Allergen Reactions  . Amlodipine Swelling    Leg swelling only   . Oxycodone Other (See Comments)    Pt states she was"out " for 2 hours and didnt notice people in the area   . Demerol [Meperidine] Diarrhea and Nausea And Vomiting  . Lisinopril Cough  . Tape Rash    VITALS:  Blood pressure (!) 122/46, pulse (!) 59, temperature 98.4 F (36.9 C), temperature source Oral, resp. rate 18, height 5\' 4"  (1.626 m), weight 111.5 kg (245 lb 12.8 oz), SpO2 97 %.  PHYSICAL EXAMINATION:   Physical Exam  Constitutional: She is oriented to person, place, and time and well-developed, well-nourished, and in no distress. No distress.  HENT:  Head:  Normocephalic.  Eyes: No scleral icterus.  Neck: Normal range of motion. Neck supple. No JVD present. No tracheal deviation present.  Cardiovascular: Normal rate, regular rhythm and normal heart sounds.  Exam reveals no gallop and no friction rub.   No murmur heard. Pulmonary/Chest: Effort normal and breath sounds normal. No respiratory distress. She has no wheezes. She has no rales. She exhibits no tenderness.  Abdominal: Soft. Bowel sounds are normal. She exhibits distension (improved ascites). She exhibits no mass. There is no tenderness. There is no rebound and no guarding.  Musculoskeletal: Normal range of motion. She exhibits edema.  Neurological: She is alert and oriented to person, place, and time.  Skin: Skin is warm. No rash noted. No erythema.  Psychiatric: Affect and judgment normal.      LABORATORY PANEL:   CBC  Recent Labs Lab 02/07/16 0531  WBC 5.8  HGB 7.9*  HCT 24.9*  PLT 124*   ------------------------------------------------------------------------------------------------------------------  Chemistries   Recent Labs Lab 02/05/16 0536  02/07/16 0531  NA 144  < > 142  K 3.4*  < > 4.0  CL 92*  < > 92*  CO2 47*  < > 48*  GLUCOSE 228*  < > 209*  BUN 32*  < > 36*  CREATININE 1.55*  < > 1.85*  CALCIUM 9.0  < > 8.8*  AST 15  --   --   ALT 14  --   --   ALKPHOS 72  --   --   BILITOT 1.1  --   --   < > = values in this interval  not displayed. ------------------------------------------------------------------------------------------------------------------  Cardiac Enzymes No results for input(s): TROPONINI in the last 168 hours. ------------------------------------------------------------------------------------------------------------------  RADIOLOGY:  Dg Chest Port 1 View  Result Date: 02/05/2016 CLINICAL DATA:  Catheter placement EXAM: PORTABLE CHEST 1 VIEW COMPARISON:  Chest radiograph 01/30/2016 FINDINGS: Right internal jugular central venous  catheter has been withdrawn slightly with tip in the superior vena cava. No PICC line is visualized. Unchanged appearance of the enlarged cardiomediastinal silhouette with atherosclerotic calcification within the aortic arch. Small left pleural effusion, slightly greater than on the prior study. Streaky perihilar opacities suggestive of interstitial edema are unchanged from the prior study. No pneumothorax. IMPRESSION: 1. No PICC line identified. 2. Slight withdrawal of right internal jugular central venous catheter with tip now in the SVC. 3. Unchanged cardiomegaly and interstitial edema. Electronically Signed   By: Ulyses Jarred M.D.   On: 02/05/2016 17:16     ASSESSMENT AND PLAN:    82 year old female with history of diabetes and combined heart failure EF 40-45% presents with shortness of breath and anasarca.  1. Generalized anasarca secondary to cardiorenal syndrome with acute on chronic combined systolic and diastolic heart failure: Jackson fraction 40-45% by echo in 2016 :Patient symptoms improving on Lasix drip. Plan is to transition Lasix drip to oral Lasix. Patient nephrology consult.  2. Acute kidney injury on chronic kidney disease stage III with a baseline crit of 1.4: Acute kidney injury is due to congestive heart failure and overdiuresis. Creatinine remained stable.  3. Acute on chronic blood loss anemia: Patient received 1 unit of PRBCs on August 15. No indication of blood transfusion at this time. Baseline hemoglobin between 8 and 8.6.  4. Type 2 diabetes with A1c of 8.4: Continue current regimen of insulin and siding scale insulin.  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 History COPD: Patient does not appear to be in acute exacerbation at this time  palliative care consult today to discuss goals of care.  Management plans discussed with the patient and she is in  agreement.  CODE STATUS: DNR  TOTAL TIME TAKING CARE OF THIS PATIENT: 28 minutes.   PT is recommended skilled nursing facility at discharge  POSSIBLE D/C 1-2 days, DEPENDING ON CLINICAL CONDITION.   Calan Doren M.D on 02/07/2016 at 12:37 PM  Between 7am to 6pm - Pager - [phone] After 6pm go to [email or web address] - password [email or web address] [facility]  Office  [phone]  CC: Primary care physician; Sharyne Peach, MD  Note: This dictation was prepared with Dragon dictation along with smaller phrase technology. Any transcriptional errors that result from this process are unintentional.

## 2016-02-07 NOTE — Consult Note (Signed)
Consultation Note Date: 02/07/2016   Patient Name: Jill York  DOB: 1931-06-05  MRN: [id number]  Age / Sex: 82 y.o., female  PCP: Sharyne Peach, MD Referring Physician: Bettey Costa, MD  Reason for Consultation: Establishing goals of care, Non pain symptom management, Pain control and Psychosocial/spiritual support  HPI/Patient Profile: 82 y.o. 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   Patient is currently at the skilled nursing facility ( for rehab for ankle fx)where she was noted to have confusion as well as hypotension, hypoxia.    Patient was previously on [facility] that had was held after her recent GI bleed. In the ER noted elevated creatinine and chest x-ray suggestive of acute congestive heart failure exasperation .  Multiple co-morbid ites, patient is faced with advanced directive decision and anticipatory care needs.   Clinical Assessment and Goals of Care:  This NP Wadie Lessen reviewed medical records, received report from team, assessed the patient and then meet at the patient's bedside along with her son/Jill York  to discuss diagnosis, prognosis, GOC, EOL wishes disposition and options.  A detailed discussion was had today regarding advanced directives.  Concepts specific to code status, artifical feeding and hydration, continued IV antibiotics and rehospitalization was had.  The difference between a aggressive medical intervention path  and a palliative comfort care path for this patient at this time was had.  Values and goals of care important to patient and family were attempted to be elicited.  MOST form introduced and Hard Choices left for review.  Concept of Hospice and Palliative Care were discussed  Natural trajectory and expectations at EOL were  discussed.  Questions and concerns addressed.   Family encouraged to call with questions or concerns.  PMT will continue to support holistically.   SUMMARY OF RECOMMENDATIONS    Code Status/Advance Care Planning:  DNR   Palliative Prophylaxis:   Aspiration, Bowel Regimen, Delirium Protocol, Frequent Pain Assessment and Oral Care  Additional Recommendations (Limitations, Scope, Preferences):  At this time patient and her family are trying to figure out just what are viable treatment options to prolong quality of life.  Quality is the priority.  Son is very realistic and comfortable having discussion with his mother regarding the situation and verbalizes support for patient "whatever she decides"  Patient will benefit from Palliative services at the SNF for continued support and assistance.   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  Psycho-social/Spiritual:    Additional Recommendations: Education on Hospice  Prognosis:   Unable to determine  Discharge Planning: [place] for rehab with Palliative care service follow-up      Primary Diagnoses: Present on Admission: **None**   I have reviewed the medical record, interviewed the patient and family, and examined the patient. The following aspects are pertinent.  Past Medical History:  Diagnosis Date  . Anemia   . Anxiety  and depression    [name]  . Arrhythmia    PVC  . CHF (congestive heart failure) ([place])   . Chronic kidney disease   . COPD (chronic obstructive pulmonary disease) ([place])   . Diabetes mellitus without complication ([place])   . Hyperlipidemia   . Hypertension   . Lymphedema   . Lymphoma ([place])    of breast- > 10 years  . Lymphoma ([place])    in [place]- diagnosed July 2017  . OSA (obstructive sleep apnea) 2011  . Osteoarthritis   . Thyroid disease    Social History   Social History  . Marital status: Widowed     Spouse name: N/A  . Number of children: N/A  . Years of education: N/A   Social History Main Topics  . Smoking status: Former Smoker    Packs/day: 1.50    Years: 18.00    Quit date: 06/25/1975  . Smokeless tobacco: Never Used  . Alcohol use No  . Drug use: No  . Sexual activity: Not Asked   Other Topics Concern  . None   Social History Narrative  . None   Family History  Problem Relation Age of Onset  . Aneurysm Mother   . Stroke Mother   . Liver cancer Father    Scheduled Meds: . amoxicillin-clavulanate  1 tablet Oral Q12H  . aspirin EC  81 mg Oral Daily  . atorvastatin  80 mg Oral Daily  . calcitRIOL  0.25 mcg Oral Q M,W,F  . cholecalciferol  1,000 Units Oral Daily  . enoxaparin (LOVENOX) injection  40 mg Subcutaneous Q24H  . feeding supplement (GLUCERNA SHAKE)  237 mL Oral TID BM  . fluticasone  1 spray Each Nare Daily  . gabapentin  300 mg Oral BID  . HYDROcodone-acetaminophen  1 tablet Oral Once  . insulin aspart  0-20 Units Subcutaneous TID WC  . insulin aspart  0-5 Units Subcutaneous QHS  . insulin aspart  5 Units Subcutaneous TID WC  . insulin glargine  33 Units Subcutaneous QHS  . latanoprost  1 drop Both Eyes QHS  . levothyroxine  137 mcg Oral Once per day on Sun Mon Tue Wed Thu Sat   And  . levothyroxine  274 mcg Oral Once per day on Fri  . mometasone-formoterol  2 puff Inhalation BID  . montelukast  10 mg Oral QHS  . nystatin  5 mL Oral QID  . pantoprazole  40 mg Oral BID AC  . pneumococcal 23 valent vaccine  0.5 mL Intramuscular Tomorrow-1000  . polyethylene glycol  17 g Oral Daily  . potassium chloride  20 mEq Oral Daily  . senna-docusate  2 tablet Oral Daily  . traMADol  100 mg Oral Once  . venlafaxine  75 mg Oral BID   Continuous Infusions: . furosemide (LASIX) infusion 6 mg/hr (02/04/16 1034)   PRN Meds:.acetaminophen, acetaminophen, albuterol, ALPRAZolam, alum & mag hydroxide-simeth, baclofen, bisacodyl, docusate sodium, naphazoline-glycerin,  nitroGLYCERIN, ondansetron, promethazine Medications Prior to Admission:  Prior to Admission medications   Medication Sig Start Date End Date Taking? Authorizing Provider  acetaminophen (TYLENOL ARTHRITIS PAIN) 650 MG CR tablet Take 650 mg by mouth every 12 (twelve) hours.   Yes Historical Provider, MD  acetaminophen (TYLENOL) 500 MG tablet Take 1,000 mg by mouth every 6 (six) hours as needed.   Yes Historical Provider, MD  albuterol (PROVENTIL HFA;VENTOLIN HFA) 108 (90 BASE) MCG/ACT inhaler Inhale 1 puff into the lungs every 4 (  four) hours as needed for wheezing or shortness of breath.    Yes Historical Provider, MD  albuterol (PROVENTIL) (2.5 MG/3ML) 0.083% nebulizer solution Take 2.5 mg by nebulization every 6 (six) hours as needed for wheezing or shortness of breath.   Yes Historical Provider, MD  amLODipine (NORVASC) 5 MG tablet Take 5 mg by mouth daily.   Yes Historical Provider, MD  atorvastatin (LIPITOR) 80 MG tablet Take 80 mg by mouth daily.   Yes Historical Provider, MD  baclofen (LIORESAL) 10 MG tablet Take 5 mg by mouth at bedtime as needed for muscle spasms.   Yes Historical Provider, MD  bisacodyl (DULCOLAX) 5 MG EC tablet Take 5 mg by mouth daily as needed for moderate constipation.   Yes Historical Provider, MD  calcitRIOL (ROCALTROL) 0.25 MCG capsule Take 0.25 mcg by mouth every Monday, Wednesday, and Friday.   Yes Historical Provider, MD  Cholecalciferol 1000 UNITS tablet Take 1,000 Units by mouth daily.   Yes Historical Provider, MD  docusate sodium (COLACE) 100 MG capsule Take 100 mg by mouth daily as needed for mild constipation or moderate constipation.    Yes Historical Provider, MD  esomeprazole (NEXIUM) 20 MG capsule Take 20 mg by mouth daily as needed (for acid reflux).   Yes Historical Provider, MD  fluticasone (FLONASE) 50 MCG/ACT nasal spray Place 1 spray into both nostrils daily.   Yes Historical Provider, MD  Fluticasone-Salmeterol (ADVAIR) 250-50 MCG/DOSE AEPB Inhale  1 puff into the lungs 2 (two) times daily.   Yes Historical Provider, MD  furosemide (LASIX) 40 MG tablet Take 40 mg by mouth daily. *Hold if weight is less than 245 lbs.*   Yes Historical Provider, MD  gabapentin (NEURONTIN) 300 MG capsule Take 300 mg by mouth 2 (two) times daily.   Yes Historical Provider, MD  insulin glargine (LANTUS) 100 UNIT/ML injection Inject 54 Units into the skin at bedtime.    Yes Historical Provider, MD  insulin lispro (HUMALOG) 100 UNIT/ML injection Inject 20-26 Units into the skin 3 (three) times daily with meals. Inject 20 units sub-q in morning, inject 26 units sub-q in afternoon, and inject 20 units sub-q in evening   Yes Historical Provider, MD  latanoprost (XALATAN) 0.005 % ophthalmic solution Place 1 drop into both eyes at bedtime.   Yes Historical Provider, MD  levothyroxine (SYNTHROID, LEVOTHROID) 137 MCG tablet Take 137-274 mcg by mouth See admin instructions. Take 175mcg (1 tablet) by mouth daily on Mon, Tues, Wed, Thurs, Sat, and Sun. Take 259mcg (2 tabs) by mouth daily on Friday. (Take with a glass of water at least 30 to 60 minutes before breakfast)   Yes Historical Provider, MD  losartan (COZAAR) 25 MG tablet Take 0.5 tablets (12.5 mg total) by mouth daily. 01/14/16  Yes Epifanio Lesches, MD  metoprolol succinate (TOPROL-XL) 25 MG 24 hr tablet Take 12.5 mg by mouth daily.   Yes Historical Provider, MD  montelukast (SINGULAIR) 10 MG tablet Take 10 mg by mouth at bedtime.   Yes Historical Provider, MD  nitroGLYCERIN (NITROSTAT) 0.4 MG SL tablet Place 0.4 mg under the tongue every 5 (five) minutes as needed for chest pain.   Yes Historical Provider, MD  pantoprazole (PROTONIX) 40 MG tablet Take 1 tablet (40 mg total) by mouth 2 (two) times daily before a meal. 01/03/16  Yes Vipul Manuella Ghazi, MD  polyethylene glycol (MIRALAX / GLYCOLAX) packet Take 17 g by mouth daily as needed.   Yes Historical Provider, MD  senna-docusate (SENOKOT-S) 8.6-50 MG  tablet Take 2 tablets  by mouth daily.    Yes Historical Provider, MD  tetrahydrozoline 0.05 % ophthalmic solution Place 1 drop into both eyes daily as needed.   Yes Historical Provider, MD  venlafaxine (EFFEXOR) 75 MG tablet Take 75 mg by mouth 2 (two) times daily.   Yes Historical Provider, MD  nystatin (MYCOSTATIN) 100000 UNIT/ML suspension Take 5 mLs (500,000 Units total) by mouth 4 (four) times daily. X 13 more days Patient not taking: Reported on 01/27/2016 01/04/16   Gladstone Lighter, MD  ondansetron (ZOFRAN) 4 MG tablet Take 1 tablet (4 mg total) by mouth every 6 (six) hours as needed for nausea. Patient not taking: Reported on 01/27/2016 01/14/16   Epifanio Lesches, MD   Allergies  Allergen Reactions  . Amlodipine Swelling    Leg swelling only   . Oxycodone Other (See Comments)    Pt states she was"out " for 2 hours and didnt notice people in the area   . Demerol [Meperidine] Diarrhea and Nausea And Vomiting  . Lisinopril Cough  . Tape Rash   Review of Systems  Constitutional: Positive for fatigue.  Neurological: Positive for weakness.    Physical Exam  Constitutional: She appears well-developed. She appears ill.  Cardiovascular: Normal rate, regular rhythm and normal heart sounds.   Pulmonary/Chest: She has decreased breath sounds in the right lower field and the left lower field.  Musculoskeletal:  -generalized weakness  Neurological: She is alert.  Skin: Skin is warm and dry.    Vital Signs: BP 139/63 (BP Location: Right Arm)   Pulse 87   Temp 98.3 F (36.8 C)   Resp 18   Ht 5\' 4"  (1.626 m)   Wt 111.5 kg (245 lb 12.8 oz) Comment: pt unable to stand  SpO2 90%   BMI 42.19 kg/m  Pain Assessment: No/denies pain   Pain Score: 0-No pain   SpO2: SpO2: 90 % O2 Device:SpO2: 90 % O2 Flow Rate: .O2 Flow Rate (L/min): 3 L/min  IO: Intake/output summary:  Intake/Output Summary (Last 24 hours) at 02/07/16 1105 Last data filed at 02/07/16 1012  Gross per 24 hour  Intake             1160  ml  Output             1150 ml  Net               10 ml    LBM: Last BM Date: 02/06/16 (per patient ) Baseline Weight: Weight: 117.9 kg (260 lb) Most recent weight: Weight: 111.5 kg (245 lb 12.8 oz) (pt unable to stand)      Palliative Assessment/Data:  30 % at best   Flowsheet Rows   Flowsheet Row Most Recent Value  Intake Tab  Referral Department  Hospitalist  Unit at Time of Referral  Cardiac/Telemetry Unit  Palliative Care Primary Diagnosis  Cancer  Date Notified  02/06/16  Palliative Care Type  New Palliative care  Reason for referral  Clarify Goals of Care  Date of Admission  01/27/16  # of days IP prior to Palliative referral  10  Clinical Assessment  Psychosocial & Spiritual Assessment  Palliative Care Outcomes     Discussed with Dr Benjie Karvonen  Time In: 1015 Time Out: 1130 Time Total: 75 min Greater than 50%  of this time was spent counseling and coordinating care related to the above assessment and plan.  Signed by: Wadie Lessen, NP   Please contact Palliative Medicine Team phone  at [phone] for questions and concerns.  For individual provider: See Shea Evans

## 2016-02-07 NOTE — Progress Notes (Signed)
Subjective:  Renal function appears to be relatively stable over the past 2 days. Creatinine currently 1.85. Remains on Lasix drip. Urine output 1.6 L over the last 3 shifts.  Objective:  Vital signs in last 24 hours:  Temp:  [97.8 F (36.6 C)-98.8 F (37.1 C)] 98.4 F (36.9 C) (08/16 1127) Pulse Rate:  [59-155] 59 (08/16 1127) Resp:  [16-18] 18 (08/16 0405) BP: (118-139)/(46-91) 122/46 (08/16 1127) SpO2:  [86 %-99 %] 97 % (08/16 1210) Weight:  [111.5 kg (245 lb 12.8 oz)] 111.5 kg (245 lb 12.8 oz) (08/16 0405)  Weight change: 0.953 kg (2 lb 1.6 oz) Filed Weights   02/05/16 0345 02/06/16 0425 02/07/16 0405  Weight: 110.6 kg (243 lb 12.8 oz) 110.5 kg (243 lb 11.2 oz) 111.5 kg (245 lb 12.8 oz)    Intake/Output:    Intake/Output Summary (Last 24 hours) at 02/07/16 1432 Last data filed at 02/07/16 1422  Gross per 24 hour  Intake             1040 ml  Output             1650 ml  Net             -610 ml     Physical Exam: General: No acute distress, lying in the bed   HEENT Anicteric, moist oral mucous membranes   Neck Supple   Pulm/lungs Bilateral crackles at bases, oxygen by nasal cannula   CVS/Heart No rub, regular with ectopic beats   Abdomen:  Distended, BS present  Extremities: 2+ pitting edema up to thighs and lower abdomen , + arms  Neurologic: Alert, oriented   Skin: No acute rashes           Basic Metabolic Panel:   Recent Labs Lab 02/03/16 0443 02/04/16 0426 02/05/16 0536 02/06/16 0500 02/07/16 0531  NA 145 145 144 145 142  K 3.5 3.5 3.4* 3.6 4.0  CL 94* 94* 92* 93* 92*  CO2 46* 45* 47* 48* 48*  GLUCOSE 179* 224* 228* 188* 209*  BUN 39* 32* 32* 35* 36*  CREATININE 1.50* 1.54* 1.55* 1.84* 1.85*  CALCIUM 8.4* 8.8* 9.0 8.8* 8.8*     CBC:  Recent Labs Lab 02/02/16 0500 02/03/16 0443 02/04/16 0426 02/06/16 0500 02/06/16 1710 02/07/16 0531  WBC 5.7 5.5 4.8 4.7  --  5.8  HGB 8.0* 7.8* 8.0* 7.2* 7.0* 7.9*  HCT 25.2* 24.5* 24.8* 22.7*  --   24.9*  MCV 93.1 91.6 91.5 92.3  --  93.0  PLT 101* 108* 104* 112*  --  124*      Microbiology:  Recent Results (from the past 720 hour(s))  MRSA PCR Screening     Status: None   Collection Time: 01/12/16  3:24 PM  Result Value Ref Range Status   MRSA by PCR NEGATIVE NEGATIVE Final    Comment:        The GeneXpert MRSA Assay (FDA approved for NASAL specimens only), is one component of a comprehensive MRSA colonization surveillance program. It is not intended to diagnose MRSA infection nor to guide or monitor treatment for MRSA infections.     Coagulation Studies: No results for input(s): LABPROT, INR in the last 72 hours.  Urinalysis: No results for input(s): COLORURINE, LABSPEC, PHURINE, GLUCOSEU, HGBUR, BILIRUBINUR, KETONESUR, PROTEINUR, UROBILINOGEN, NITRITE, LEUKOCYTESUR in the last 72 hours.  Invalid input(s): APPERANCEUR    Imaging: Dg Chest Port 1 View  Result Date: 02/05/2016 CLINICAL DATA:  Catheter placement EXAM: PORTABLE CHEST  1 VIEW COMPARISON:  Chest radiograph 01/30/2016 FINDINGS: Right internal jugular central venous catheter has been withdrawn slightly with tip in the superior vena cava. No PICC line is visualized. Unchanged appearance of the enlarged cardiomediastinal silhouette with atherosclerotic calcification within the aortic arch. Small left pleural effusion, slightly greater than on the prior study. Streaky perihilar opacities suggestive of interstitial edema are unchanged from the prior study. No pneumothorax. IMPRESSION: 1. No PICC line identified. 2. Slight withdrawal of right internal jugular central venous catheter with tip now in the SVC. 3. Unchanged cardiomegaly and interstitial edema. Electronically Signed   By: Ulyses Jarred M.D.   On: 02/05/2016 17:16     Medications:   . furosemide (LASIX) infusion 6 mg/hr (02/04/16 1034)   . amoxicillin-clavulanate  1 tablet Oral Q12H  . aspirin EC  81 mg Oral Daily  . atorvastatin  80 mg Oral  Daily  . calcitRIOL  0.25 mcg Oral Q M,W,F  . cholecalciferol  1,000 Units Oral Daily  . enoxaparin (LOVENOX) injection  40 mg Subcutaneous Q24H  . feeding supplement (GLUCERNA SHAKE)  237 mL Oral TID BM  . fluticasone  1 spray Each Nare Daily  . gabapentin  300 mg Oral BID  . HYDROcodone-acetaminophen  1 tablet Oral Once  . insulin aspart  0-20 Units Subcutaneous TID WC  . insulin aspart  0-5 Units Subcutaneous QHS  . insulin aspart  5 Units Subcutaneous TID WC  . insulin glargine  35 Units Subcutaneous QHS  . latanoprost  1 drop Both Eyes QHS  . levothyroxine  137 mcg Oral Once per day on Sun Mon Tue Wed Thu Sat   And  . levothyroxine  274 mcg Oral Once per day on Fri  . mometasone-formoterol  2 puff Inhalation BID  . montelukast  10 mg Oral QHS  . nystatin  5 mL Oral QID  . pantoprazole  40 mg Oral BID AC  . pneumococcal 23 valent vaccine  0.5 mL Intramuscular Tomorrow-1000  . polyethylene glycol  17 g Oral Daily  . potassium chloride  20 mEq Oral Daily  . senna-docusate  2 tablet Oral Daily  . traMADol  100 mg Oral Once  . venlafaxine  75 mg Oral BID   acetaminophen, acetaminophen, albuterol, ALPRAZolam, alum & mag hydroxide-simeth, baclofen, bisacodyl, docusate sodium, naphazoline-glycerin, nitroGLYCERIN, ondansetron, promethazine  Assessment/ Plan:  82 y.o. female with diabetes mellitus type II, diabetic neuropathy, diabetic retinopathy, hypertension, glaucoma, hyperlipidemia, depression, recent left navicular fracture, who was admitted to [facility] on 01/27/2016   1. Acute Renal Failure on Chronic kidney disease stage III. Baseline creatinine of 1.4, eGFR of 34. Acute renal failure secondary to acute exacerbation of congestive heart failure and then overdiuresis. Chronic kidney disease from hypertension and diabetes.  -  Creatinine stable at 1.5 over the past 2 days.  BUN also stable.  Edema in lower extremities appears to be less tight.  Therefore we will discontinue furosemide  drip.  We will consider placing on by mouth Lasix tomorrow.  2. Anasarca/Generalized edema - edema in the lower extremity is appears to be less tight.  Discontinue Lasix drip and consider by mouth Lasix tomorrow.  3.  Diabetes Mellitus type II: with chronic kidney disease: hemoglobin A1c of 8.4% - low salt, carb restricted diet  4. Alkalosis -ABG shows normal pH of 7.43 with elevated PCO2 of 85. Therefore this is chronic compensation. Continue to monitor.    LOS: [place] 8/16/20172:32 PM

## 2016-02-08 LAB — BASIC METABOLIC PANEL
Anion gap: 5 (ref 5–15)
BUN: 40 mg/dL — AB (ref 6–20)
CALCIUM: 8.7 mg/dL — AB (ref 8.9–10.3)
CHLORIDE: 91 mmol/L — AB (ref 101–111)
CO2: 48 mmol/L — ABNORMAL HIGH (ref 22–32)
CREATININE: 1.82 mg/dL — AB (ref 0.44–1.00)
GFR, EST AFRICAN AMERICAN: 28 mL/min — AB (ref >60)
GFR, EST NON AFRICAN AMERICAN: 25 mL/min — AB (ref >60)
Glucose, Bld: 140 mg/dL — ABNORMAL HIGH (ref 65–99)
Potassium: 3.8 mmol/L (ref 3.5–5.1)
SODIUM: 144 mmol/L (ref 135–145)

## 2016-02-08 LAB — CBC
HCT: 23.1 % — ABNORMAL LOW (ref 35.0–47.0)
Hemoglobin: 7.6 g/dL — ABNORMAL LOW (ref 12.0–16.0)
MCH: 30.1 pg (ref 26.0–34.0)
MCHC: 32.8 g/dL (ref 32.0–36.0)
MCV: 91.8 fL (ref 80.0–100.0)
PLATELETS: 133 10*3/uL — AB (ref 150–440)
RBC: 2.51 MIL/uL — AB (ref 3.80–5.20)
RDW: 18.8 % — AB (ref 11.5–14.5)
WBC: 4.9 10*3/uL (ref 3.6–11.0)

## 2016-02-08 LAB — GLUCOSE, CAPILLARY
GLUCOSE-CAPILLARY: 144 mg/dL — AB (ref 65–99)
Glucose-Capillary: 217 mg/dL — ABNORMAL HIGH (ref 65–99)

## 2016-02-08 MED ORDER — POTASSIUM CHLORIDE CRYS ER 20 MEQ PO TBCR: 20.0000 meq | EXTENDED_RELEASE_TABLET | Freq: Every day | ORAL | 0 refills | Status: DC

## 2016-02-08 MED ORDER — GLUCERNA SHAKE PO LIQD: 237.0000 mL | Freq: Three times a day (TID) | ORAL | 0 refills | Status: DC

## 2016-02-08 NOTE — Progress Notes (Signed)
Patient d/c'd via EMS. Juliann Pulse at [facility] notified that patient is in route. Wilnette Kales

## 2016-02-08 NOTE — Progress Notes (Signed)
Report called to [name] at [facility]. EMS called for transport. Jill York

## 2016-02-08 NOTE — Discharge Summary (Signed)
[facility] [place] NAME: Jill York    MR#:  [id number]  DATE OF BIRTH:  [birth year]  DATE OF ADMISSION:  01/27/2016 ADMITTING PHYSICIAN: Baxter Hire, MD  DATE OF DISCHARGE: 02/08/2016  PRIMARY CARE PHYSICIAN: Sharyne Peach, MD    ADMISSION DIAGNOSIS:  Hyperkalemia [E87.5] Peripheral edema [R60.9] COPD exacerbation (HCC) [J44.1] Congestive heart failure, unspecified congestive heart failure chronicity, unspecified congestive heart failure type ([place]) [I50.9]  DISCHARGE DIAGNOSIS:  Active Problems:   Congestive heart failure (HCC)   DNR (do not resuscitate)   Palliative care by specialist   COPD exacerbation ([place])   Weakness generalized   SECONDARY DIAGNOSIS:   Past Medical History:  Diagnosis Date  . Anemia   . Anxiety and depression    [name]  . Arrhythmia    PVC  . CHF (congestive heart failure) ([place])   . Chronic kidney disease   . COPD (chronic obstructive pulmonary disease) ([place])   . Diabetes mellitus without complication ([place])   . Hyperlipidemia   . Hypertension   . Lymphedema   . Lymphoma ([place])    of breast- > 10 years  . Lymphoma ([place])    in Duodenum- diagnosed July 2017  . OSA (obstructive sleep apnea) 2011  . Osteoarthritis   . Thyroid disease     HOSPITAL COURSE:   82 year old female with history of diabetes and combined heart failure EF 40-45% presents with shortness of breath and anasarca.  1. Generalized anasarca secondary to cardiorenal syndrome with acute on chronic combined systolic and diastolic heart failure:Ejection fraction 40-45% by echo in 2016 :Patient symptoms have improved on Lasix drip.She may resume ORAL LASIX at discharge. Please be mindful of l ow sodium diet and daily weights. She diuresed very well with IV LAsix. She is on Metoprolol and lasix.  2. Acute kidney injury on chronic kidney disease stage III with a baseline creatinine of 1.4: Acute  kidney injury is due to congestive heart failure and overdiuresis. Creatinine remained stable. Her new baseline may be around 1.8. ACEI is on hold for now due to her renal issues. However if creatinine remains stable then it would be reasonable to restart LOSARTAN 35 mg daily, provided her Blood pressure can tolerate this.  3. Acute on chronic blood loss anemia: Patient received 1 unit of PRBCs on August 15. No indication of blood transfusion at this time. Baseline hemoglobin between 8 and 8.6.  4. Type 2 diabetes with A1c of 8.4: Continue current regimen of insulin and siding scale insulin.  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 History COPD: Patient does not appear to be in acute exacerbation at this time  palliative care was consulted during this stay.  Son is very realistic and comfortable having discussion with his mother regarding the situation and verbalizes support for patient "whatever she decides"  Patient will benefit from Palliative services at the SNF for continued support and assistance.   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 Amlodipine Swelling    Leg swelling only   . Oxycodone Other (See Comments)    Pt states she was"out "  for 2 hours and didnt notice people in the area   . Demerol [Meperidine] Diarrhea and Nausea And Vomiting  . Lisinopril Cough  . Tape Rash    DISCHARGE MEDICATIONS:   Current Discharge Medication List    START taking these medications   Details  feeding supplement, GLUCERNA SHAKE, (GLUCERNA SHAKE) LIQD Take 237 mLs by mouth 3 (three) times daily between meals. Qty: 30 Can, Refills: 0     potassium chloride SA (K-DUR,KLOR-CON) 20 MEQ tablet Take 1 tablet (20 mEq total) by mouth daily. Qty: 30 tablet, Refills: 0      CONTINUE these medications which have NOT CHANGED   Details  acetaminophen (TYLENOL) 500 MG tablet Take 1,000 mg by mouth every 6 (six) hours as needed.    albuterol (PROVENTIL HFA;VENTOLIN HFA) 108 (90 BASE) MCG/ACT inhaler Inhale 1 puff into the lungs every 4 (four) hours as needed for wheezing or shortness of breath.     albuterol (PROVENTIL) (2.5 MG/3ML) 0.083% nebulizer solution Take 2.5 mg by nebulization every 6 (six) hours as needed for wheezing or shortness of breath.    atorvastatin (LIPITOR) 80 MG tablet Take 80 mg by mouth daily.    baclofen (LIORESAL) 10 MG tablet Take 5 mg by mouth at bedtime as needed for muscle spasms.    bisacodyl (DULCOLAX) 5 MG EC tablet Take 5 mg by mouth daily as needed for moderate constipation.    calcitRIOL (ROCALTROL) 0.25 MCG capsule Take 0.25 mcg by mouth every Monday, Wednesday, and Friday.    Cholecalciferol 1000 UNITS tablet Take 1,000 Units by mouth daily.    docusate sodium (COLACE) 100 MG capsule Take 100 mg by mouth daily as needed for mild constipation or moderate constipation.     esomeprazole (NEXIUM) 20 MG capsule Take 20 mg by mouth daily as needed (for acid reflux).    fluticasone (FLONASE) 50 MCG/ACT nasal spray Place 1 spray into both nostrils daily.    Fluticasone-Salmeterol (ADVAIR) 250-50 MCG/DOSE AEPB Inhale 1 puff into the lungs 2 (two) times daily.    furosemide (LASIX) 40 MG tablet Take 40 mg by mouth daily. *Hold if weight is less than 245 lbs.*    gabapentin (NEURONTIN) 300 MG capsule Take 300 mg by mouth 2 (two) times daily.    insulin glargine (LANTUS) 100 UNIT/ML injection Inject 54 Units into the skin at bedtime.     insulin lispro (HUMALOG) 100 UNIT/ML injection Inject 20-26 Units into the skin 3 (three) times daily with meals. Inject 20 units sub-q in morning, inject 26 units  sub-q in afternoon, and inject 20 units sub-q in evening    latanoprost (XALATAN) 0.005 % ophthalmic solution Place 1 drop into both eyes at bedtime.    levothyroxine (SYNTHROID, LEVOTHROID) 137 MCG tablet Take 137-274 mcg by mouth See admin instructions. Take 150mcg (1 tablet) by mouth daily on Mon, Tues, Wed, Thurs, Sat, and Sun. Take 272mcg (2 tabs) by mouth daily on Friday. (Take with a glass of water at least 30 to 60 minutes before breakfast)    metoprolol succinate (TOPROL-XL) 25 MG 24 hr tablet Take 12.5 mg by mouth daily.    montelukast (SINGULAIR) 10 MG tablet Take 10 mg by mouth at bedtime.    nitroGLYCERIN (NITROSTAT) 0.4 MG SL tablet Place 0.4 mg under the tongue every 5 (five) minutes as needed for chest pain.    pantoprazole (PROTONIX) 40 MG tablet Take 1 tablet (40 mg total) by mouth 2 (two) times daily before a meal. Qty:  60 tablet, Refills: 0    polyethylene glycol (MIRALAX / GLYCOLAX) packet Take 17 g by mouth daily as needed.    senna-docusate (SENOKOT-S) 8.6-50 MG tablet Take 2 tablets by mouth daily.     tetrahydrozoline 0.05 % ophthalmic solution Place 1 drop into both eyes daily as needed.    venlafaxine (EFFEXOR) 75 MG tablet Take 75 mg by mouth 2 (two) times daily.    nystatin (MYCOSTATIN) 100000 UNIT/ML suspension Take 5 mLs (500,000 Units total) by mouth 4 (four) times daily. X 13 more days Qty: 60 mL, Refills: 0    ondansetron (ZOFRAN) 4 MG tablet Take 1 tablet (4 mg total) by mouth every 6 (six) hours as needed for nausea. Qty: 20 tablet, Refills: 0      STOP taking these medications     acetaminophen (TYLENOL ARTHRITIS PAIN) 650 MG CR tablet      amLODipine (NORVASC) 5 MG tablet      losartan (COZAAR) 25 MG tablet               Today   CHIEF COMPLAINT:  Doing very well this am sob improved + weak but no chest pain or dizziness   VITAL SIGNS:  Blood pressure 133/68, pulse 88, temperature 97.8 F (36.6 C), resp. rate 18, height 5\' 4"   (1.626 m), weight 111.5 kg (245 lb 14.4 oz), SpO2 95 %.   REVIEW OF SYSTEMS:  Review of Systems  Constitutional: Negative for chills, fever and malaise/fatigue.  HENT: Negative.  Negative for ear discharge, ear pain, hearing loss, nosebleeds and sore throat.   Eyes: Negative.  Negative for blurred vision and pain.  Respiratory: Negative.  Negative for cough, hemoptysis, shortness of breath and wheezing.   Cardiovascular: Positive for leg swelling. Negative for chest pain and palpitations.  Gastrointestinal: Negative.  Negative for abdominal pain, blood in stool, diarrhea, nausea and vomiting.  Genitourinary: Negative.  Negative for dysuria.  Musculoskeletal: Negative.  Negative for back pain.  Skin: Negative.   Neurological: Positive for weakness. Negative for dizziness, tremors, speech change, focal weakness, seizures and headaches.  Endo/Heme/Allergies: Negative.  Does not bruise/bleed easily.  Psychiatric/Behavioral: Negative.  Negative for depression, hallucinations and suicidal ideas.     PHYSICAL EXAMINATION:  GENERAL:  83 y.o.-year-old patient lying in the bed with no acute distress.  NECK:  Supple, no jugular venous distention. No thyroid enlargement, no tenderness.  LUNGS: Normal breath sounds bilaterally, no wheezing, rales,rhonchi  No use of accessory muscles of respiration.  CARDIOVASCULAR: S1, S2 normal. ++ murmurs, NO rubs, or gallops.  ABDOMEN: Soft, non-tender, non-distended. Bowel sounds present. No organomegaly or mass.  EXTREMITIES: ++1+ edema, no cyanosis, or clubbing.  PSYCHIATRIC: The patient is alert and oriented x 3.  SKIN: No obvious rash, lesion, or ulcer.   DATA REVIEW:   CBC  Recent Labs Lab 02/08/16 0600  WBC 4.9  HGB 7.6*  HCT 23.1*  PLT 133*    Chemistries   Recent Labs Lab 02/05/16 0536  02/08/16 0600  NA 144  < > 144  K 3.4*  < > 3.8  CL 92*  < > 91*  CO2 47*  < > 48*  GLUCOSE 228*  < > 140*  BUN 32*  < > 40*  CREATININE 1.55*   < > 1.82*  CALCIUM 9.0  < > 8.7*  AST 15  --   --   ALT 14  --   --   ALKPHOS 72  --   --  BILITOT 1.1  --   --   < > = values in this interval not displayed.  Cardiac Enzymes No results for input(s): TROPONINI in the last 168 hours.  Microbiology Results  @[id number] @  RADIOLOGY:  No results found.    Management plans discussed with the patient and she is in agreement. Stable for discharge SNF with palliatve care consult  Patient should follow up with pcp  CODE STATUS:     Code Status Orders        Start     Ordered   01/27/16 1721  Do not attempt resuscitation (DNR)  Continuous    Question Answer Comment  In the event of cardiac or respiratory ARREST Do not call a "code blue"   In the event of cardiac or respiratory ARREST Do not perform Intubation, CPR, defibrillation or ACLS   In the event of cardiac or respiratory ARREST Use medication by any route, position, wound care, and other measures to relive pain and suffering. May use oxygen, suction and manual treatment of airway obstruction as needed for comfort.      01/27/16 1720    Code Status History    Date Active Date Inactive Code Status Order ID Comments User Context   01/12/2016  1:00 PM 01/14/2016  5:07 PM DNR [id number]  Dustin Flock, MD ED   12/25/2015  7:51 PM 01/04/2016  3:27 PM DNR [id number]  Baxter Hire, MD ED   12/15/2015 11:59 AM 12/20/2015  8:33 PM DNR [id number]  Demetrios Loll, MD Inpatient   01/01/2015 10:29 AM 01/04/2015  1:43 PM DNR [id number]  Loletha Grayer, MD ED    Advance Directive Documentation   Flowsheet Row Most Recent Value  Type of Advance Directive  Out of facility DNR (pink MOST or yellow form)  Pre-existing out of facility DNR order (yellow form or pink MOST form)  Physician notified to receive inpatient order  "MOST" Form in Place?  No data      TOTAL TIME TAKING CARE OF THIS PATIENT: 36 minutes.    Note: This dictation was prepared with Dragon dictation along with smaller phrase  technology. Any transcriptional errors that result from this process are unintentional.  Dung Salinger M.D on 02/08/2016 at 9:09 AM  Between 7am to 6pm - Pager - [phone] After 6pm go to [email or web address] - password [email or web address] [facility]  Office  [phone]  CC: Primary care physician; Sharyne Peach, MD

## 2016-02-08 NOTE — Progress Notes (Signed)
New referral for Palliative Medicine to follow at [facility] received from [place] NP. Information faxed to [facility] referral. Thank you. Flo Shanks RN, BSN, [place] [facility] [place], [facility] [phone] c

## 2016-02-08 NOTE — Progress Notes (Signed)
Subjective:  Renal function has been stable over the past 2 days. Creatinine remains 1.8. Palliative care has been consulted. She has a duodenal mass showing atypical lymphoid infiltrate suspicious for low-grade B-cell lymphoma.  Objective:  Vital signs in last 24 hours:  Temp:  [97.8 F (36.6 C)-98.3 F (36.8 C)] 97.8 F (36.6 C) (08/17 1140) Pulse Rate:  [88-91] 88 (08/17 1140) Resp:  [18-19] 19 (08/17 1140) BP: (105-133)/(43-68) 115/51 (08/17 1140) SpO2:  [87 %-97 %] 92 % (08/17 1140) Weight:  [111.5 kg (245 lb 14.4 oz)] 111.5 kg (245 lb 14.4 oz) (08/17 0358)  Weight change: 0.045 kg (1.6 oz) Filed Weights   02/06/16 0425 02/07/16 0405 02/08/16 0358  Weight: 110.5 kg (243 lb 11.2 oz) 111.5 kg (245 lb 12.8 oz) 111.5 kg (245 lb 14.4 oz)    Intake/Output:    Intake/Output Summary (Last 24 hours) at 02/08/16 1206 Last data filed at 02/08/16 0900  Gross per 24 hour  Intake              652 ml  Output             1200 ml  Net             -548 ml     Physical Exam: General: No acute distress, lying in the bed   HEENT Anicteric, moist oral mucous membranes   Neck Supple   Pulm/lungs Minimal basilar rales, oxygen by nasal cannula   CVS/Heart No rub, regular with ectopic beats   Abdomen:  Distended, BS present  Extremities: 1+ edema b/l LE's  Neurologic: Alert, oriented x 3, follows commands  Skin: No acute rashes           Basic Metabolic Panel:   Recent Labs Lab 02/04/16 0426 02/05/16 0536 02/06/16 0500 02/07/16 0531 02/08/16 0600  NA 145 144 145 142 144  K 3.5 3.4* 3.6 4.0 3.8  CL 94* 92* 93* 92* 91*  CO2 45* 47* 48* 48* 48*  GLUCOSE 224* 228* 188* 209* 140*  BUN 32* 32* 35* 36* 40*  CREATININE 1.54* 1.55* 1.84* 1.85* 1.82*  CALCIUM 8.8* 9.0 8.8* 8.8* 8.7*     CBC:  Recent Labs Lab 02/03/16 0443 02/04/16 0426 02/06/16 0500 02/06/16 1710 02/07/16 0531 02/08/16 0600  WBC 5.5 4.8 4.7  --  5.8 4.9  HGB 7.8* 8.0* 7.2* 7.0* 7.9* 7.6*  HCT  24.5* 24.8* 22.7*  --  24.9* 23.1*  MCV 91.6 91.5 92.3  --  93.0 91.8  PLT 108* 104* 112*  --  124* 133*      Microbiology:  Recent Results (from the past 720 hour(s))  MRSA PCR Screening     Status: None   Collection Time: 01/12/16  3:24 PM  Result Value Ref Range Status   MRSA by PCR NEGATIVE NEGATIVE Final    Comment:        The GeneXpert MRSA Assay (FDA approved for NASAL specimens only), is one component of a comprehensive MRSA colonization surveillance program. It is not intended to diagnose MRSA infection nor to guide or monitor treatment for MRSA infections.     Coagulation Studies: No results for input(s): LABPROT, INR in the last 72 hours.  Urinalysis: No results for input(s): COLORURINE, LABSPEC, PHURINE, GLUCOSEU, HGBUR, BILIRUBINUR, KETONESUR, PROTEINUR, UROBILINOGEN, NITRITE, LEUKOCYTESUR in the last 72 hours.  Invalid input(s): APPERANCEUR    Imaging: No results found.   Medications:     . amoxicillin-clavulanate  1 tablet Oral Q12H  . aspirin  EC  81 mg Oral Daily  . atorvastatin  80 mg Oral Daily  . calcitRIOL  0.25 mcg Oral Q M,W,F  . cholecalciferol  1,000 Units Oral Daily  . enoxaparin (LOVENOX) injection  40 mg Subcutaneous Q24H  . feeding supplement (GLUCERNA SHAKE)  237 mL Oral TID BM  . fluticasone  1 spray Each Nare Daily  . gabapentin  300 mg Oral BID  . HYDROcodone-acetaminophen  1 tablet Oral Once  . insulin aspart  0-20 Units Subcutaneous TID WC  . insulin aspart  0-5 Units Subcutaneous QHS  . insulin aspart  5 Units Subcutaneous TID WC  . insulin glargine  35 Units Subcutaneous QHS  . latanoprost  1 drop Both Eyes QHS  . levothyroxine  137 mcg Oral Once per day on Sun Mon Tue Wed Thu Sat   And  . levothyroxine  274 mcg Oral Once per day on Fri  . mometasone-formoterol  2 puff Inhalation BID  . montelukast  10 mg Oral QHS  . nystatin  5 mL Oral QID  . pantoprazole  40 mg Oral BID AC  . pneumococcal 23 valent vaccine  0.5 mL  Intramuscular Tomorrow-1000  . polyethylene glycol  17 g Oral Daily  . potassium chloride  20 mEq Oral Daily  . senna-docusate  2 tablet Oral Daily  . traMADol  100 mg Oral Once  . venlafaxine  75 mg Oral BID   acetaminophen, acetaminophen, albuterol, ALPRAZolam, alum & mag hydroxide-simeth, baclofen, bisacodyl, docusate sodium, naphazoline-glycerin, nitroGLYCERIN, ondansetron, promethazine  Assessment/ Plan:  82 y.o. female with diabetes mellitus type II, diabetic neuropathy, diabetic retinopathy, hypertension, glaucoma, hyperlipidemia, depression, recent left navicular fracture, who was admitted to [facility] on 01/27/2016   1. Acute Renal Failure on Chronic kidney disease stage III. Baseline creatinine of 1.4, eGFR of 34. Acute renal failure secondary to acute exacerbation of congestive heart failure and then overdiuresis. Chronic kidney disease from hypertension and diabetes.  -  Over the past 2 days creatinine has been stable at 1.2. She was on Lasix drip which was likely contributing to the higher BUN and creatinine. Hopefully creatinine will settle allergies back on by mouth Lasix. Renal function may be periodically checked at the nursing home.  2. Anasarca/Generalized edema - Overall significantly improved from admission. Patient being transitioned back to by mouth Lasix at the nursing home..  3.  Diabetes Mellitus type II: with chronic kidney disease: hemoglobin A1c of 8.4% - low salt, carb restricted diet  4. Alkalosis -Likely secondary to CO2 retention and subsequent compensation as well as diuresis.    LOS: 12 Cyndra Feinberg 8/17/201712:06 PM

## 2016-02-08 NOTE — Clinical Social Work Note (Signed)
Patient to be d/c'ed today to [facility].  Patient and family agreeable to plans will transport via ems RN to call report to [place], MSW Mon-Fri 8a-4:30p [phone]

## 2016-02-09 DIAGNOSIS — F39 Unspecified mood [affective] disorder: Secondary | ICD-10-CM | POA: Diagnosis not present

## 2016-02-09 DIAGNOSIS — J439 Emphysema, unspecified: Secondary | ICD-10-CM | POA: Diagnosis not present

## 2016-02-09 DIAGNOSIS — I5022 Chronic systolic (congestive) heart failure: Secondary | ICD-10-CM

## 2016-02-12 ENCOUNTER — Encounter: Payer: Self-pay | Admitting: Emergency Medicine

## 2016-02-12 ENCOUNTER — Emergency Department: Payer: Medicare Other

## 2016-02-12 ENCOUNTER — Inpatient Hospital Stay
Admission: EM | Admit: 2016-02-12 | Discharge: 2016-02-14 | DRG: 189 | Disposition: A | Discharge status: Hospice | Payer: Medicare Other | Attending: Internal Medicine | Admitting: Internal Medicine

## 2016-02-12 DIAGNOSIS — I130 Hypertensive heart and chronic kidney disease with heart failure and stage 1 through stage 4 chronic kidney disease, or unspecified chronic kidney disease: Secondary | ICD-10-CM | POA: Diagnosis present

## 2016-02-12 DIAGNOSIS — G4733 Obstructive sleep apnea (adult) (pediatric): Secondary | ICD-10-CM | POA: Diagnosis present

## 2016-02-12 DIAGNOSIS — E1122 Type 2 diabetes mellitus with diabetic chronic kidney disease: Secondary | ICD-10-CM | POA: Diagnosis present

## 2016-02-12 DIAGNOSIS — J449 Chronic obstructive pulmonary disease, unspecified: Secondary | ICD-10-CM | POA: Diagnosis present

## 2016-02-12 DIAGNOSIS — Z87891 Personal history of nicotine dependence: Secondary | ICD-10-CM

## 2016-02-12 DIAGNOSIS — R4182 Altered mental status, unspecified: Secondary | ICD-10-CM | POA: Diagnosis present

## 2016-02-12 DIAGNOSIS — M1990 Unspecified osteoarthritis, unspecified site: Secondary | ICD-10-CM | POA: Diagnosis present

## 2016-02-12 DIAGNOSIS — R740 Nonspecific elevation of levels of transaminase and lactic acid dehydrogenase [LDH]: Secondary | ICD-10-CM | POA: Diagnosis present

## 2016-02-12 DIAGNOSIS — Z823 Family history of stroke: Secondary | ICD-10-CM | POA: Diagnosis not present

## 2016-02-12 DIAGNOSIS — J9621 Acute and chronic respiratory failure with hypoxia: Secondary | ICD-10-CM | POA: Diagnosis present

## 2016-02-12 DIAGNOSIS — I5022 Chronic systolic (congestive) heart failure: Secondary | ICD-10-CM | POA: Diagnosis present

## 2016-02-12 DIAGNOSIS — Z794 Long term (current) use of insulin: Secondary | ICD-10-CM

## 2016-02-12 DIAGNOSIS — IMO0002 Reserved for concepts with insufficient information to code with codable children: Secondary | ICD-10-CM

## 2016-02-12 DIAGNOSIS — N179 Acute kidney failure, unspecified: Secondary | ICD-10-CM | POA: Diagnosis present

## 2016-02-12 DIAGNOSIS — F419 Anxiety disorder, unspecified: Secondary | ICD-10-CM | POA: Diagnosis present

## 2016-02-12 DIAGNOSIS — F329 Major depressive disorder, single episode, unspecified: Secondary | ICD-10-CM | POA: Diagnosis present

## 2016-02-12 DIAGNOSIS — Z79899 Other long term (current) drug therapy: Secondary | ICD-10-CM | POA: Diagnosis not present

## 2016-02-12 DIAGNOSIS — I252 Old myocardial infarction: Secondary | ICD-10-CM | POA: Diagnosis not present

## 2016-02-12 DIAGNOSIS — Z800 Family history of malignant neoplasm of digestive organs: Secondary | ICD-10-CM

## 2016-02-12 DIAGNOSIS — Z66 Do not resuscitate: Secondary | ICD-10-CM | POA: Diagnosis present

## 2016-02-12 DIAGNOSIS — R778 Other specified abnormalities of plasma proteins: Secondary | ICD-10-CM | POA: Diagnosis present

## 2016-02-12 DIAGNOSIS — Z955 Presence of coronary angioplasty implant and graft: Secondary | ICD-10-CM | POA: Diagnosis not present

## 2016-02-12 DIAGNOSIS — Z8572 Personal history of non-Hodgkin lymphomas: Secondary | ICD-10-CM

## 2016-02-12 DIAGNOSIS — R69 Illness, unspecified: Secondary | ICD-10-CM | POA: Diagnosis not present

## 2016-02-12 DIAGNOSIS — Z515 Encounter for palliative care: Secondary | ICD-10-CM | POA: Diagnosis present

## 2016-02-12 DIAGNOSIS — E785 Hyperlipidemia, unspecified: Secondary | ICD-10-CM | POA: Diagnosis present

## 2016-02-12 DIAGNOSIS — N189 Chronic kidney disease, unspecified: Secondary | ICD-10-CM | POA: Diagnosis present

## 2016-02-12 LAB — CBC WITH DIFFERENTIAL/PLATELET
BASOS ABS: 0 10*3/uL (ref 0–0.1)
BASOS PCT: 1 %
Eosinophils Absolute: 0.2 10*3/uL (ref 0–0.7)
Eosinophils Relative: 2 %
HEMATOCRIT: 27.5 % — AB (ref 35.0–47.0)
HEMOGLOBIN: 8.8 g/dL — AB (ref 12.0–16.0)
LYMPHS PCT: 14 %
Lymphs Abs: 1 10*3/uL (ref 1.0–3.6)
MCH: 30.1 pg (ref 26.0–34.0)
MCHC: 32.1 g/dL (ref 32.0–36.0)
MCV: 93.7 fL (ref 80.0–100.0)
Monocytes Absolute: 0.5 10*3/uL (ref 0.2–0.9)
Monocytes Relative: 6 %
NEUTROS ABS: 5.9 10*3/uL (ref 1.4–6.5)
NEUTROS PCT: 77 %
Platelets: 208 10*3/uL (ref 150–440)
RBC: 2.93 MIL/uL — ABNORMAL LOW (ref 3.80–5.20)
RDW: 19.3 % — ABNORMAL HIGH (ref 11.5–14.5)
WBC: 7.7 10*3/uL (ref 3.6–11.0)

## 2016-02-12 LAB — COMPREHENSIVE METABOLIC PANEL
ALBUMIN: 3.3 g/dL — AB (ref 3.5–5.0)
ALK PHOS: 144 U/L — AB (ref 38–126)
ALT: 686 U/L — AB (ref 14–54)
AST: 543 U/L — AB (ref 15–41)
Anion gap: 10 (ref 5–15)
BILIRUBIN TOTAL: 1.5 mg/dL — AB (ref 0.3–1.2)
BUN: 73 mg/dL — AB (ref 6–20)
CALCIUM: 8.6 mg/dL — AB (ref 8.9–10.3)
CO2: 38 mmol/L — ABNORMAL HIGH (ref 22–32)
CREATININE: 3.61 mg/dL — AB (ref 0.44–1.00)
Chloride: 92 mmol/L — ABNORMAL LOW (ref 101–111)
GFR calc Af Amer: 12 mL/min — ABNORMAL LOW (ref >60)
GFR calc non Af Amer: 11 mL/min — ABNORMAL LOW (ref >60)
GLUCOSE: 76 mg/dL (ref 65–99)
Potassium: 4.6 mmol/L (ref 3.5–5.1)
Sodium: 140 mmol/L (ref 135–145)
TOTAL PROTEIN: 6.9 g/dL (ref 6.5–8.1)

## 2016-02-12 LAB — LACTIC ACID, PLASMA: LACTIC ACID, VENOUS: 1 mmol/L (ref 0.5–1.9)

## 2016-02-12 LAB — GLUCOSE, CAPILLARY: Glucose-Capillary: 75 mg/dL (ref 65–99)

## 2016-02-12 LAB — TYPE AND SCREEN
ABO/RH(D): A POS
Antibody Screen: NEGATIVE

## 2016-02-12 LAB — LIPASE, BLOOD: LIPASE: 22 U/L (ref 11–51)

## 2016-02-12 LAB — PROTIME-INR
INR: 1.38
PROTHROMBIN TIME: 17.1 s — AB (ref 11.4–15.2)

## 2016-02-12 LAB — TROPONIN I: TROPONIN I: 0.34 ng/mL — AB (ref <0.03)

## 2016-02-12 LAB — APTT: APTT: 30 s (ref 24–36)

## 2016-02-12 MED ORDER — ACETAMINOPHEN 325 MG PO TABS: 650.0000 mg | ORAL_TABLET | Freq: Four times a day (QID) | ORAL | Status: DC | PRN

## 2016-02-12 MED ORDER — LORAZEPAM 1 MG PO TABS: 1.0000 mg | ORAL_TABLET | ORAL | Status: DC | PRN

## 2016-02-12 MED ORDER — ACETAMINOPHEN 650 MG RE SUPP: 650.0000 mg | Freq: Four times a day (QID) | RECTAL | Status: DC | PRN

## 2016-02-12 MED ORDER — SODIUM CHLORIDE 0.9 % IV BOLUS (SEPSIS)
500.0000 mL | Freq: Once | INTRAVENOUS | Status: AC
  Administered 2016-02-12: 500 mL via INTRAVENOUS

## 2016-02-12 MED ORDER — MORPHINE SULFATE (CONCENTRATE) 10 MG/0.5ML PO SOLN
5.0000 mg | ORAL | Status: DC | PRN
  Administered 2016-02-14: 5 mg via SUBLINGUAL
  Filled 2016-02-12: qty 1

## 2016-02-12 MED ORDER — MORPHINE SULFATE (CONCENTRATE) 10 MG/0.5ML PO SOLN: 5.0000 mg | ORAL | Status: DC | PRN

## 2016-02-12 MED ORDER — ONDANSETRON 4 MG PO TBDP
4.0000 mg | ORAL_TABLET | Freq: Four times a day (QID) | ORAL | Status: DC | PRN
  Administered 2016-02-13: 4 mg via ORAL
  Filled 2016-02-12 (×2): qty 1

## 2016-02-12 MED ORDER — MORPHINE SULFATE (PF) 2 MG/ML IV SOLN: 1.0000 mg | INTRAVENOUS | Status: DC | PRN

## 2016-02-12 MED ORDER — ONDANSETRON HCL 4 MG/2ML IJ SOLN: 4.0000 mg | Freq: Four times a day (QID) | INTRAMUSCULAR | Status: DC | PRN

## 2016-02-12 MED ORDER — LORAZEPAM 2 MG/ML IJ SOLN
1.0000 mg | INTRAMUSCULAR | Status: DC | PRN
  Administered 2016-02-12 – 2016-02-14 (×6): 1 mg via INTRAVENOUS
  Filled 2016-02-12 (×8): qty 1

## 2016-02-12 NOTE — Progress Notes (Signed)
Patients family stated that she is ready to be Hospice Care and wants to minimize any blood draws. Patients family stated the ABG last visit was not critical. Family also stated that she was stuck a few times already for blood. ABG refused from family members. Informed Dr. Edd Fabian

## 2016-02-12 NOTE — ED Notes (Signed)
Family at bedside, request to see MD before inserting a foley cath. MD notified

## 2016-02-12 NOTE — ED Triage Notes (Signed)
Pt to ED from [facility], pt to ED for AMS, pt Alert to self. Pt 84% on RA upon arrival, pt placed on non rebreather. Per family pt was hard to arouse. PMD called code sepsis at this time

## 2016-02-12 NOTE — H&P (Signed)
[facility] [place] NAME: Jill York    MR#:  [id number]  DATE OF BIRTH:  1930/10/24   DATE OF ADMISSION:  02/12/2016  PRIMARY CARE PHYSICIAN: Sharyne Peach, MD   REQUESTING/REFERRING PHYSICIAN: Edd Fabian  CHIEF COMPLAINT:   Chief Complaint  Patient presents with  . Altered Mental Status    HISTORY OF PRESENT ILLNESS:  Jill York  is a 82 y.o. female with a known history of COPD, congestive heart failure systolic presenting with altered mental status shortness of breath. Patient unable to provide any information given mental status medical condition history obtained from the son at bedside. States it patient is already DO NOT RESUSCITATE with declining respiratory status. He noticed today that she was minimally responsive with some perioral cyanosis sent to Hospital further workup and evaluation. Upon arrival he simply states "I want her to be comfortable" Once again patient unable to provide meaningful information  PAST MEDICAL HISTORY:   Past Medical History:  Diagnosis Date  . Anemia   . Anxiety and depression    [name]  . Arrhythmia    PVC  . CHF (congestive heart failure) ([place])   . Chronic kidney disease   . COPD (chronic obstructive pulmonary disease) ([place])   . Diabetes mellitus without complication ([place])   . Hyperlipidemia   . Hypertension   . Lymphedema   . Lymphoma ([place])    of breast- > 10 years  . Lymphoma ([place])    in Duodenum- diagnosed July 2017  . OSA (obstructive sleep apnea) 2011  . Osteoarthritis   . Thyroid disease     PAST SURGICAL HISTORY:   Past Surgical History:  Procedure Laterality Date  . ABDOMINAL HYSTERECTOMY    . APPENDECTOMY    . CARDIAC CATHETERIZATION    . cardiac stents    . CORONARY ANGIOPLASTY     x4  . ESOPHAGOGASTRODUODENOSCOPY (EGD) WITH PROPOFOL N/A 12/27/2015   Procedure: ESOPHAGOGASTRODUODENOSCOPY (EGD) WITH PROPOFOL;  Surgeon: Lollie Sails, MD;  Location: [facility] ENDOSCOPY;  Service: Endoscopy;  Laterality: N/A;  Late afternoon case  . ESOPHAGOGASTRODUODENOSCOPY (EGD) WITH PROPOFOL N/A 01/03/2016   Procedure: ESOPHAGOGASTRODUODENOSCOPY (EGD) WITH PROPOFOL;  Surgeon: Lollie Sails, MD;  Location: [facility] ENDOSCOPY;  Service: Endoscopy;  Laterality: N/A;  . KNEE SURGERY Left   . TONSILLECTOMY AND ADENOIDECTOMY      SOCIAL HISTORY:   Social History  Substance Use Topics  . Smoking status: Former Smoker    Packs/day: 1.50    Years: 18.00    Quit date: 06/25/1975  . Smokeless tobacco: Never Used  . Alcohol use No    FAMILY HISTORY:   Family History  Problem Relation Age of Onset  . Aneurysm Mother   . Stroke Mother   . Liver cancer Father     DRUG ALLERGIES:   Allergies  Allergen Reactions  . Amlodipine Swelling    Leg swelling only   . Oxycodone Other (See Comments)    Pt states she was"out " for 2 hours and didnt notice people in the area   . Demerol [Meperidine] Diarrhea and Nausea And Vomiting  . Lisinopril Cough  . Tape Rash    REVIEW OF SYSTEMS:  Unable to obtain given mental status medical condition   MEDICATIONS AT HOME:   Prior to Admission medications   Medication Sig Start Date End Date Taking? Authorizing Provider  acetaminophen (TYLENOL) 500 MG tablet Take 1,000 mg  by mouth every 6 (six) hours as needed.    Historical Provider, MD  albuterol (PROVENTIL HFA;VENTOLIN HFA) 108 (90 BASE) MCG/ACT inhaler Inhale 1 puff into the lungs every 4 (four) hours as needed for wheezing or shortness of breath.     Historical Provider, MD  albuterol (PROVENTIL) (2.5 MG/3ML) 0.083% nebulizer solution Take 2.5 mg by nebulization every 6 (six) hours as needed for wheezing or shortness of breath.    Historical Provider, MD  atorvastatin (LIPITOR) 80 MG tablet Take 80 mg by mouth daily.    Historical Provider, MD  baclofen (LIORESAL) 10 MG tablet Take 5 mg by mouth at bedtime as needed for muscle spasms.     Historical Provider, MD  bisacodyl (DULCOLAX) 5 MG EC tablet Take 5 mg by mouth daily as needed for moderate constipation.    Historical Provider, MD  calcitRIOL (ROCALTROL) 0.25 MCG capsule Take 0.25 mcg by mouth every Monday, Wednesday, and Friday.    Historical Provider, MD  Cholecalciferol 1000 UNITS tablet Take 1,000 Units by mouth daily.    Historical Provider, MD  docusate sodium (COLACE) 100 MG capsule Take 100 mg by mouth daily as needed for mild constipation or moderate constipation.     Historical Provider, MD  esomeprazole (NEXIUM) 20 MG capsule Take 20 mg by mouth daily as needed (for acid reflux).    Historical Provider, MD  feeding supplement, GLUCERNA SHAKE, (GLUCERNA SHAKE) LIQD Take 237 mLs by mouth 3 (three) times daily between meals. 02/08/16   Sital Mody, MD  fluticasone (FLONASE) 50 MCG/ACT nasal spray Place 1 spray into both nostrils daily.    Historical Provider, MD  Fluticasone-Salmeterol (ADVAIR) 250-50 MCG/DOSE AEPB Inhale 1 puff into the lungs 2 (two) times daily.    Historical Provider, MD  furosemide (LASIX) 40 MG tablet Take 40 mg by mouth daily. *Hold if weight is less than 245 lbs.*    Historical Provider, MD  gabapentin (NEURONTIN) 300 MG capsule Take 300 mg by mouth 2 (two) times daily.    Historical Provider, MD  insulin glargine (LANTUS) 100 UNIT/ML injection Inject 54 Units into the skin at bedtime.     Historical Provider, MD  insulin lispro (HUMALOG) 100 UNIT/ML injection Inject 20-26 Units into the skin 3 (three) times daily with meals. Inject 20 units sub-q in morning, inject 26 units sub-q in afternoon, and inject 20 units sub-q in evening    Historical Provider, MD  latanoprost (XALATAN) 0.005 % ophthalmic solution Place 1 drop into both eyes at bedtime.    Historical Provider, MD  levothyroxine (SYNTHROID, LEVOTHROID) 137 MCG tablet Take 137-274 mcg by mouth See admin instructions. Take 125mcg (1 tablet) by mouth daily on Mon, Tues, Wed, Thurs, Sat, and Sun.  Take 270mcg (2 tabs) by mouth daily on Friday. (Take with a glass of water at least 30 to 60 minutes before breakfast)    Historical Provider, MD  metoprolol succinate (TOPROL-XL) 25 MG 24 hr tablet Take 12.5 mg by mouth daily.    Historical Provider, MD  montelukast (SINGULAIR) 10 MG tablet Take 10 mg by mouth at bedtime.    Historical Provider, MD  nitroGLYCERIN (NITROSTAT) 0.4 MG SL tablet Place 0.4 mg under the tongue every 5 (five) minutes as needed for chest pain.    Historical Provider, MD  nystatin (MYCOSTATIN) 100000 UNIT/ML suspension Take 5 mLs (500,000 Units total) by mouth 4 (four) times daily. X 13 more days Patient not taking: Reported on 01/27/2016 01/04/16   Gladstone Lighter, MD  ondansetron (ZOFRAN) 4 MG tablet Take 1 tablet (4 mg total) by mouth every 6 (six) hours as needed for nausea. Patient not taking: Reported on 01/27/2016 01/14/16   Epifanio Lesches, MD  pantoprazole (PROTONIX) 40 MG tablet Take 1 tablet (40 mg total) by mouth 2 (two) times daily before a meal. 01/03/16   Max Sane, MD  polyethylene glycol (MIRALAX / GLYCOLAX) packet Take 17 g by mouth daily as needed.    Historical Provider, MD  potassium chloride SA (K-DUR,KLOR-CON) 20 MEQ tablet Take 1 tablet (20 mEq total) by mouth daily. 02/08/16   Sital Mody, MD  senna-docusate (SENOKOT-S) 8.6-50 MG tablet Take 2 tablets by mouth daily.     Historical Provider, MD  tetrahydrozoline 0.05 % ophthalmic solution Place 1 drop into both eyes daily as needed.    Historical Provider, MD  venlafaxine (EFFEXOR) 75 MG tablet Take 75 mg by mouth 2 (two) times daily.    Historical Provider, MD      VITAL SIGNS:  Blood pressure 111/60, pulse 79, temperature 98.2 F (36.8 C), temperature source Rectal, resp. rate 16, SpO2 100 %.  PHYSICAL EXAMINATION:   VITAL SIGNS: Vitals:   02/12/16 2050 02/12/16 2105  BP:  111/60  Pulse: 87 79  Resp: (!) 24 16  Temp:     GENERAL:82 y.o.female moderate distress given mental status.    HEAD: Normocephalic, atraumatic.  EYES: Pupils equal, round, reactive to light. Unable to assess extraocular muscles given mental status/medical condition. No scleral icterus.  MOUTH: Moist mucosal membrane. Dentition intact. No abscess noted.  EAR, NOSE, THROAT: Clear without exudates. No external lesions.  NECK: Supple. No thyromegaly. No nodules. No JVD.  PULMONARY: Diminished breath sounds with scattered rhonchi without wheeze  No use of accessory muscles, Good respiratory effort. good air entry bilaterally CHEST: Nontender to palpation.  CARDIOVASCULAR: S1 and S2. Regular rate and rhythm. No murmurs, rubs, or gallops. No edema. Pedal pulses 2+ bilaterally.  GASTROINTESTINAL: Soft, nontender, nondistended. No masses. Positive bowel sounds. No hepatosplenomegaly.  MUSCULOSKELETAL: No swelling, clubbing, or edema. Range of motion full in all extremities.  NEUROLOGIC: Unable to assess given mental status/medical condition SKIN: No ulceration, lesions, rashes, or cyanosis. Skin warm and dry. Turgor intact.  PSYCHIATRIC: Unable to assess given mental status/medical condition     LABORATORY PANEL:   CBC  Recent Labs Lab 02/12/16 2014  WBC 7.7  HGB 8.8*  HCT 27.5*  PLT 208   ------------------------------------------------------------------------------------------------------------------  Chemistries   Recent Labs Lab 02/12/16 1901  NA 140  K 4.6  CL 92*  CO2 38*  GLUCOSE 76  BUN 73*  CREATININE 3.61*  CALCIUM 8.6*  AST 543*  ALT 686*  ALKPHOS 144*  BILITOT 1.5*   ------------------------------------------------------------------------------------------------------------------  Cardiac Enzymes  Recent Labs Lab 02/12/16 1901  TROPONINI 0.34*   ------------------------------------------------------------------------------------------------------------------  RADIOLOGY:  Dg Abd Portable 1 View  Result Date: 02/12/2016 CLINICAL DATA:  Abdominal distention  EXAM: PORTABLE ABDOMEN - 1 VIEW COMPARISON:  None. FINDINGS: Relatively gasless abdomen. No evidence of bowel obstruction. No concerning intra-abdominal mass effect or calcification. Right iliac stenting. Bulky spondylosis with multi-level ankylosis. Cardiomegaly with lung bases better visualized on contemporaneous chest x-ray. IMPRESSION: Nonobstructive bowel gas pattern. Electronically Signed   By: Monte Fantasia M.D.   On: 02/12/2016 21:06    EKG:   Orders placed or performed during the hospital encounter of 02/12/16  . EKG 12-Lead  . EKG 12-Lead    IMPRESSION AND PLAN:   82 year old Caucasian female history  of COPD and systolic congestive heart 5 present shortness of breath altered mental status  1. Acute on chronic hypoxic respiratory failure 2. Elevated troponin 3. Acute renal failure 4. Transaminitis  After talking with family they wish to pursue comfort measures, with main goal of care to alleviate symptoms not treat. They realize this decision will ultimately lead to her death. We'll place on comfort measures    All the records are reviewed and case discussed with ED provider. Management plans discussed with the patient, family and they are in agreement.  CODE STATUS: DO NOT RESUSCITATE/comfort measures  TOTAL TIME TAKING CARE OF THIS PATIENT: 40 minutes.    Hower,  Karenann Cai.D on 02/12/2016 at 9:20 PM  Between 7am to 6pm - Pager - [phone]  After 6pm: House Pager: - [phone]  [facility]  Office  [phone]  CC: Primary care physician; Sharyne Peach, MD

## 2016-02-12 NOTE — ED Notes (Signed)
Pt hard IV stick, MD at bedside with [place].

## 2016-02-12 NOTE — ED Provider Notes (Signed)
[facility] Emergency Department Provider Note   ____________________________________________   First MD Initiated Contact with Patient 02/12/16 1900     (approximate)  I have reviewed the triage vital signs and the nursing notes.   HISTORY  Chief Complaint Altered Mental Status  Caveat-history of present illness and review of systems Limited due to the patient's altered mental status. All information is obtained from her family at bedside.  HPI Jill York is a 82 y.o. female with history of CHF, COPD, hypertension, hyperlipidemia, diabetes who presents with altered mental status first noted today. Patient was discharged from [facility] and 02/08/2016 after treatment for CHF exacerbation with anasarca, COPD exacerbation. Palliative Care was consulted at that time. During her most recent admission she also had GI blood loss anemia requiring transfusion. She is not had any dark or tarry stools to her family's knowledge. He denies any fever. She is a DO NOT RESUSCITATE, DO NOT INTUBATE.   Past Medical History:  Diagnosis Date  . Anemia   . Anxiety and depression    [name]  . Arrhythmia    PVC  . CHF (congestive heart failure) ([place])   . Chronic kidney disease   . COPD (chronic obstructive pulmonary disease) ([place])   . Diabetes mellitus without complication ([place])   . Hyperlipidemia   . Hypertension   . Lymphedema   . Lymphoma ([place])    of breast- > 10 years  . Lymphoma ([place])    in Duodenum- diagnosed July 2017  . OSA (obstructive sleep apnea) 2011  . Osteoarthritis   . Thyroid disease     Patient Active Problem List   Diagnosis Date Noted  . Acute on chronic respiratory failure with hypoxia ([place]) 02/12/2016  . DNR (do not resuscitate) 02/07/2016  . Palliative care by specialist 02/07/2016  . COPD exacerbation ([place])   . Weakness generalized   . Congestive heart failure ([place]) 01/27/2016  .  Episodes of formed visual hallucinations 12/18/2015  . NSTEMI (non-ST elevated myocardial infarction) ([place]) 12/15/2015  . Acute respiratory failure with hypoxia ([place]) 01/01/2015  . Chronic diastolic heart failure ([place]) 11/01/2014    Past Surgical History:  Procedure Laterality Date  . ABDOMINAL HYSTERECTOMY    . APPENDECTOMY    . CARDIAC CATHETERIZATION    . cardiac stents    . CORONARY ANGIOPLASTY     x4  . ESOPHAGOGASTRODUODENOSCOPY (EGD) WITH PROPOFOL N/A 12/27/2015   Procedure: ESOPHAGOGASTRODUODENOSCOPY (EGD) WITH PROPOFOL;  Surgeon: Lollie Sails, MD;  Location[facility] ENDOSCOPY;  Service: Endoscopy;  Laterality: N/A;  Late afternoon case  . ESOPHAGOGASTRODUODENOSCOPY (EGD) WITH PROPOFOL N/A 01/03/2016   Procedure: ESOPHAGOGASTRODUODENOSCOPY (EGD) WITH PROPOFOL;  Surgeon: Lollie Sails, MD;  Location: [facility] ENDOSCOPY;  Service: Endoscopy;  Laterality: N/A;  . KNEE SURGERY Left   . TONSILLECTOMY AND ADENOIDECTOMY      Prior to Admission medications   Medication Sig Start Date End Date Taking? Authorizing Provider  acetaminophen (TYLENOL) 500 MG tablet Take 1,000 mg by mouth every 6 (six) hours as needed.    Historical Provider, MD  albuterol (PROVENTIL HFA;VENTOLIN HFA) 108 (90 BASE) MCG/ACT inhaler Inhale 1 puff into the lungs every 4 (four) hours as needed for wheezing or shortness of breath.     Historical Provider, MD  albuterol (PROVENTIL) (2.5 MG/3ML) 0.083% nebulizer solution Take 2.5 mg by nebulization every 6 (six) hours as needed for wheezing or shortness of breath.    Historical  Provider, MD  atorvastatin (LIPITOR) 80 MG tablet Take 80 mg by mouth daily.    Historical Provider, MD  baclofen (LIORESAL) 10 MG tablet Take 5 mg by mouth at bedtime as needed for muscle spasms.    Historical Provider, MD  bisacodyl (DULCOLAX) 5 MG EC tablet Take 5 mg by mouth daily as needed for moderate constipation.    Historical Provider, MD  calcitRIOL (ROCALTROL) 0.25 MCG capsule Take  0.25 mcg by mouth every Monday, Wednesday, and Friday.    Historical Provider, MD  Cholecalciferol 1000 UNITS tablet Take 1,000 Units by mouth daily.    Historical Provider, MD  docusate sodium (COLACE) 100 MG capsule Take 100 mg by mouth daily as needed for mild constipation or moderate constipation.     Historical Provider, MD  esomeprazole (NEXIUM) 20 MG capsule Take 20 mg by mouth daily as needed (for acid reflux).    Historical Provider, MD  feeding supplement, GLUCERNA SHAKE, (GLUCERNA SHAKE) LIQD Take 237 mLs by mouth 3 (three) times daily between meals. 02/08/16   Sital Mody, MD  fluticasone (FLONASE) 50 MCG/ACT nasal spray Place 1 spray into both nostrils daily.    Historical Provider, MD  Fluticasone-Salmeterol (ADVAIR) 250-50 MCG/DOSE AEPB Inhale 1 puff into the lungs 2 (two) times daily.    Historical Provider, MD  furosemide (LASIX) 40 MG tablet Take 40 mg by mouth daily. *Hold if weight is less than 245 lbs.*    Historical Provider, MD  gabapentin (NEURONTIN) 300 MG capsule Take 300 mg by mouth 2 (two) times daily.    Historical Provider, MD  insulin glargine (LANTUS) 100 UNIT/ML injection Inject 54 Units into the skin at bedtime.     Historical Provider, MD  insulin lispro (HUMALOG) 100 UNIT/ML injection Inject 20-26 Units into the skin 3 (three) times daily with meals. Inject 20 units sub-q in morning, inject 26 units sub-q in afternoon, and inject 20 units sub-q in evening    Historical Provider, MD  latanoprost (XALATAN) 0.005 % ophthalmic solution Place 1 drop into both eyes at bedtime.    Historical Provider, MD  levothyroxine (SYNTHROID, LEVOTHROID) 137 MCG tablet Take 137-274 mcg by mouth See admin instructions. Take 134mcg (1 tablet) by mouth daily on Mon, Tues, Wed, Thurs, Sat, and Sun. Take 245mcg (2 tabs) by mouth daily on Friday. (Take with a glass of water at least 30 to 60 minutes before breakfast)    Historical Provider, MD  metoprolol succinate (TOPROL-XL) 25 MG 24 hr  tablet Take 12.5 mg by mouth daily.    Historical Provider, MD  montelukast (SINGULAIR) 10 MG tablet Take 10 mg by mouth at bedtime.    Historical Provider, MD  nitroGLYCERIN (NITROSTAT) 0.4 MG SL tablet Place 0.4 mg under the tongue every 5 (five) minutes as needed for chest pain.    Historical Provider, MD  nystatin (MYCOSTATIN) 100000 UNIT/ML suspension Take 5 mLs (500,000 Units total) by mouth 4 (four) times daily. X 13 more days Patient not taking: Reported on 01/27/2016 01/04/16   Gladstone Lighter, MD  ondansetron (ZOFRAN) 4 MG tablet Take 1 tablet (4 mg total) by mouth every 6 (six) hours as needed for nausea. Patient not taking: Reported on 01/27/2016 01/14/16   Epifanio Lesches, MD  pantoprazole (PROTONIX) 40 MG tablet Take 1 tablet (40 mg total) by mouth 2 (two) times daily before a meal. 01/03/16   Max Sane, MD  polyethylene glycol (MIRALAX / GLYCOLAX) packet Take 17 g by mouth daily as needed.  Historical Provider, MD  potassium chloride SA (K-DUR,KLOR-CON) 20 MEQ tablet Take 1 tablet (20 mEq total) by mouth daily. 02/08/16   Sital Mody, MD  senna-docusate (SENOKOT-S) 8.6-50 MG tablet Take 2 tablets by mouth daily.     Historical Provider, MD  tetrahydrozoline 0.05 % ophthalmic solution Place 1 drop into both eyes daily as needed.    Historical Provider, MD  venlafaxine (EFFEXOR) 75 MG tablet Take 75 mg by mouth 2 (two) times daily.    Historical Provider, MD    Allergies Amlodipine; Oxycodone; Demerol [meperidine]; Lisinopril; and Tape  Family History  Problem Relation Age of Onset  . Aneurysm Mother   . Stroke Mother   . Liver cancer Father     Social History Social History  Substance Use Topics  . Smoking status: Former Smoker    Packs/day: 1.50    Years: 18.00    Quit date: 06/25/1975  . Smokeless tobacco: Never Used  . Alcohol use No    Review of Systems   Caveat-history of present illness and review of systems Limited due to the patient's altered mental status.  All information is obtained from her family at bedside. ____________________________________________   PHYSICAL EXAM:  VITAL SIGNS: ED Triage Vitals  Enc Vitals Group     BP 02/12/16 1855 102/67     Pulse Rate 02/12/16 1855 90     Resp 02/12/16 1901 19     Temp 02/12/16 1920 98.2 F (36.8 C)     Temp Source 02/12/16 1920 Rectal     SpO2 02/12/16 1855 100 %     Weight --      Height --      Head Circumference --      Peak Flow --      Pain Score --      Pain Loc --      Pain Edu? --      Excl. in [place]? --     Constitutional: Appears somewhat sleepy but is awake, alert and oriented to self only. Increased work of breathing. Chronically ill-appearing. Eyes: Conjunctivae are normal. PERRL. EOMI. Head: Atraumatic. Nose: No congestion/rhinnorhea. Mouth/Throat: Mucous membranes are dry.  Oropharynx non-erythematous. Neck: No stridor.   Cardiovascular: Normal rate, regular rhythm. Grossly normal heart sounds.  Good peripheral circulation. Respiratory: Mildly increased work of breathing, diminished breath sounds in bilateral bases. Gastrointestinal: Soft and nontender. Mildly distended. No CVA tenderness. Genitourinary: Deferred. Musculoskeletal: No lower extremity tenderness nor edema.  No joint effusions. Neurologic:  Normal speech and language. Follow commands to move all extremities which she does equally however does not further cooperate with formal neurological testing. Skin:  Skin is warm, dry and intact. No rash noted. Psychiatric: Mood and affect are normal. Speech and behavior are normal.  ____________________________________________   LABS (all labs ordered are listed, but only abnormal results are displayed)  Labs Reviewed  CBC WITH DIFFERENTIAL/PLATELET - Abnormal; Notable for the following:       Result Value   RBC 2.93 (*)    Hemoglobin 8.8 (*)    HCT 27.5 (*)    RDW 19.3 (*)    All other components within normal limits  COMPREHENSIVE METABOLIC PANEL -  Abnormal; Notable for the following:    Chloride 92 (*)    CO2 38 (*)    BUN 73 (*)    Creatinine, Ser 3.61 (*)    Calcium 8.6 (*)    Albumin 3.3 (*)    AST 543 (*)    ALT  686 (*)    Alkaline Phosphatase 144 (*)    Total Bilirubin 1.5 (*)    GFR calc non Af Amer 11 (*)    GFR calc Af Amer 12 (*)    All other components within normal limits  TROPONIN I - Abnormal; Notable for the following:    Troponin I 0.34 (*)    All other components within normal limits  PROTIME-INR - Abnormal; Notable for the following:    Prothrombin Time 17.1 (*)    All other components within normal limits  CULTURE, BLOOD (ROUTINE X 2)  LIPASE, BLOOD  APTT  LACTIC ACID, PLASMA  GLUCOSE, CAPILLARY  TYPE AND SCREEN   ____________________________________________  EKG  ED ECG REPORT I, Joanne Gavel, the attending physician, personally viewed and interpreted this ECG.   Date: 02/12/2016  EKG Time: 19:12  Rate: 93  Rhythm: atrial fibrillation, rate 93  Axis: normal  Intervals:left anterior fascicular block  ST&T Change: No acute ST elevation. ST depression in lead 2, 3, aVF and ST depression in V5 and V6.  ____________________________________________  RADIOLOGY  CXR  Abd plain films ____________________________________________   PROCEDURES  Procedure(s) performed:   US guided peripheral IV placed by me in the left basilic vein without complication after risks and benefits discussed with son at bedside.   Procedures  Critical Care performed: No  ____________________________________________   INITIAL IMPRESSION / ASSESSMENT AND PLAN / ED COURSE  Pertinent labs & imaging results that were available during my care of the patient were reviewed by me and considered in my medical decision making (see chart for details).  Jill York is a 82 y.o. female with history of CHF, COPD, hypertension, hyperlipidemia, diabetes who presents with altered mental status first noted today. On  exam, she is complaining of discomfort reporting "I just feel bad". She is awake but oriented only to self. She appears dehydrated. Her vital signs are notable for hypoxia, initial O2 saturation was 81% which improved to 100% on nonrebreather. She is mildly tachypneic but the remainder for vital signs are stable she is afebrile. She appears to have an intact neurological examination. EKG shows ST depression in multiple leads. We'll obtain screening labs, chest x-ray, abdominal pain films as well as urinalysis. I discussed with her family at bedside who reports that they would not want to pursue any aggressive measures at this time, they're hoping to admit her and get hospice care arranged.  ----------------------------------------- 9:10 PM on 02/12/2016 -----------------------------------------  Patient with gross elevations of her LFTs, troponin is elevated, she has doubled her creatinine. She has multisystem organ failure. Discussed this with her son and daughter at bedside. They wish to make her solely comfort care. I discussed the case with the hospitalist for admission at this time.  Clinical Course     ____________________________________________   FINAL CLINICAL IMPRESSION(S) / ED DIAGNOSES  Final diagnoses:  Multisystem organ failure  Altered mental status, unspecified altered mental status type      NEW MEDICATIONS STARTED DURING THIS VISIT:  Current Discharge Medication List       Note:  This document was prepared using Dragon voice recognition software and may include unintentional dictation errors.    Joanne Gavel, MD 02/12/16 [phone]

## 2016-02-13 NOTE — Care Management Important Message (Signed)
Important Message  Patient Details  Name: Jill York MRN: [id number] Date of Birth: January 26, 1931   Medicare Important Message Given:  Yes    Shelbie Ammons, RN 02/13/2016, 8:47 AM

## 2016-02-13 NOTE — Progress Notes (Signed)
[place] [facility] [place] NAME: Jill York    MR#:  [id number]  DATE OF BIRTH:  Jan 25, 1931  SUBJECTIVE:  Non verbal, unresponsive On comfort measure Son in the room Pt appears comofrtable  REVIEW OF SYSTEMS:   Review of Systems  Unable to perform ROS: Patient unresponsive   Tolerating Diet:npo   DRUG ALLERGIES:   Allergies  Allergen Reactions  . Amlodipine Swelling    Leg swelling only   . Oxycodone Other (See Comments)    Pt states she was"out " for 2 hours and didnt notice people in the area   . Demerol [Meperidine] Diarrhea and Nausea And Vomiting  . Lisinopril Cough  . Tape Rash    VITALS:  Blood pressure (!) 147/60, pulse 88, temperature 97.7 F (36.5 C), temperature source Oral, resp. rate (!) 21, height 5\' 4"  (1.626 m), weight 246 lb (111.6 kg), SpO2 100 %.  PHYSICAL EXAMINATION:   Physical Exam  GENERAL:  82 y.o.-year-old patient lying in the bed with no acute distress.  EYES: Pupils equal, round, reactive to light and accommodation. No scleral icterus. Marland Kitchen  HEENT: Head atraumatic, normocephalic. Oropharynx and nasopharynx clear.  NECK:  Supple, no jugular venous distention. No thyroid enlargement, no tenderness.  LUNGS: Normal breath sounds bilaterally, no wheezing, rales, rhonchi. No use of accessory muscles of respiration.  CARDIOVASCULAR: S1, S2 normal. No murmurs, rubs, or gallops.  ABDOMEN: Soft, nontender, nondistended. Bowel sounds present. No organomegaly or mass.  EXTREMITIES: No cyanosis, clubbing or edema b/l.    NEUROLOGIC:unresponsive  PSYCHIATRIC:  Unresponsive  LABORATORY PANEL:  CBC  Recent Labs Lab 02/12/16 2014  WBC 7.7  HGB 8.8*  HCT 27.5*  PLT 208    Chemistries   Recent Labs Lab 02/12/16 1901  NA 140  K 4.6  CL 92*  CO2 38*  GLUCOSE 76  BUN 73*  CREATININE 3.61*  CALCIUM 8.6*  AST 543*  ALT 686*  ALKPHOS 144*  BILITOT 1.5*   Cardiac Enzymes  Recent Labs Lab  02/12/16 1901  TROPONINI 0.34*   RADIOLOGY:  Dg Chest Portable 1 View  Result Date: 02/12/2016 CLINICAL DATA:  Altered mental status. Shortness of breath for the past week. EXAM: PORTABLE CHEST 1 VIEW COMPARISON:  02/05/2016. FINDINGS: Stable enlarged cardiac silhouette. Increased prominence of the pulmonary vasculature. Prominent interstitial markings with overall mild improvement. Probable left pleural effusion with ill-defined opacity at the left lung base. Thoracic spine degenerative changes. IMPRESSION: 1. Cardiomegaly and congestive heart failure. 2. Probable left pleural effusion with left basilar atelectasis or pneumonia. Electronically Signed   By: Claudie Revering M.D.   On: 02/12/2016 21:07   Dg Abd Portable 1 View  Result Date: 02/12/2016 CLINICAL DATA:  Abdominal distention EXAM: PORTABLE ABDOMEN - 1 VIEW COMPARISON:  None. FINDINGS: Relatively gasless abdomen. No evidence of bowel obstruction. No concerning intra-abdominal mass effect or calcification. Right iliac stenting. Bulky spondylosis with multi-level ankylosis. Cardiomegaly with lung bases better visualized on contemporaneous chest x-ray. IMPRESSION: Nonobstructive bowel gas pattern. Electronically Signed   By: Monte Fantasia M.D.   On: 02/12/2016 21:06   ASSESSMENT AND PLAN:   82 year old Caucasian female history of COPD and systolic congestive heart 5 present shortness of breath altered mental status  1. Acute on chronic hypoxic respiratory failure with elevated troponin and acute renal fialure Pt on comfort measures only per son's request -morphine prn  -oxygen as needed  D/w son CSW looking into [facility] hospice facility and  [place] also  Case discussed with Care Management/Social Worker. Management plans discussed with the patient, family and they are in agreement.  CODE STATUS: DNR  DVT Prophylaxis: on hospice TOTAL TIME TAKING CARE OF THIS PATIENT: 20 minutes.  >50% time spent on counselling and  coordination of care son    Note: This dictation was prepared with Dragon dictation along with smaller phrase technology. Any transcriptional errors that result from this process are unintentional.  Jill York M.D on 02/13/2016 at 11:34 AM  Between 7am to 6pm - Pager - [phone]  After 6pm go to [email or web address] - password [email or web address] [facility]  Office  [phone]  CC: Primary care physician; Sharyne Peach, MD

## 2016-02-13 NOTE — Clinical Social Work Note (Signed)
Clinical Social Work Assessment  Patient Details  Name: Jill York MRN: [id number] Date of Birth: 1931/01/06  Date of referral:  02/13/16               Reason for consult:  Discharge Planning                Permission sought to share information with:  Family Supports Permission granted to share information::     Name::        Agency::     Relationship::   ([name]- Daughter)  Contact Information:     Housing/Transportation Living arrangements for the past 2 months:  [place], [address] ([place]) Source of Information:  Adult Children Surveyor, mining- Daughter ) Patient Interpreter Needed:  None Criminal Activity/Legal Involvement Pertinent to Current Situation/Hospitalization:  No - Comment as needed Significant Relationships:  Adult Children, Other Family Members, Friend Lives with:  Facility Resident [facility] Do you feel safe going back to the place where you live?  No Need for family participation in patient care:  Yes (Comment) ([name]- Daughter )  Care giving concerns:  Patient is from [facility] but family is interested in Residential Hospice placement   Social Worker assessment / plan:  CSW and CSW Intern met with patient and her daughter at bedside. CSW introduced herself and her role. Per patient's daughter their family has agreed that Residential Hospice placement is most appropriate for their mother. Reported their father went to [place] and that she's interested in seeing if patient can go there as well. Reported that if [facility] cannot accept patient then they would be interested in patient going to [facility]. Stated they'd like to [place] home if [facility] declines. Granted CSW verbal permission to send [place] referrals to [place] and [facility]. Granted CSW verbal permission to contact [facility] and update them on patient's status.  CSW contacted [place]. Made a referral to [facility]. She reports she'll pass that information on and they will give CSW a call back if additional information is needed. CSW contacted [place] Liaison and informed her of above. She stated she will follow patient in case her services are needed.   CSW contacted Seth Bake- Development worker, international aid at [facility] and informed her of above. She stated the family has cleaned patient's room. CSW will continue to follow and assist.   Employment status:  Retired Insurance underwriter information:  Medicare PT Recommendations:  Not assessed at this time Information / Referral to community resources:  Other (Comment Required) ([place] )  Patient/Family's Response to care:  Patient's family is in agreement for patient to go to a [place].   Patient/Family's Understanding of and Emotional Response to Diagnosis, Current Treatment, and Prognosis: Patient's daughter reports that she understands patient's Diagnosis, Current Treatment, and Prognosis. Thanked CSW for her assistance.   Emotional Assessment Appearance:  Appears stated age Attitude/Demeanor/Rapport:  Unable to Assess Affect (typically observed):  Unable to Assess Orientation:   (Unable to Assess) Alcohol / Substance use:  Not Applicable Psych involvement (Current and /or in the community):  No (Comment)  Discharge Needs  Concerns to be addressed:  Other (Comment Required ([place] ) Readmission within the last 30 days:  No Current discharge risk:  Chronically ill Barriers to Discharge:  Continued Medical Work up   [employer], LCSW 02/13/2016, 12:00 PM

## 2016-02-13 NOTE — Progress Notes (Signed)
Nutrition Brief Note  Chart reviewed. Pt now transitioning to comfort care.  No further nutrition interventions warranted at this time.  Please re-consult as needed.   Blanche Scovell M. Posie Lillibridge, MS, RD LDN Inpatient Clinical Dietitian Pager [phone]    

## 2016-02-13 NOTE — Care Management (Signed)
Admitted to [facility] with the diagnosis of acute/chronic respiratory failure. Discharged from this facility to [place] 02/08/16. Daughter, [name], at the bedside. Comfort Measures continue. Daughter states that she and her brother are wanting to place their mother at a Hospice facility, either local or [facility]. Ms. Negro currently sleeping. Daughter states her mother hasn't eaten anything since the day before.  Received referral for Hospice facility. Discussed with daughter that a referral would go to the Clinical Social Worker to discuss the Hospice facilities. Shelbie Ammons RN MSN [place] [phone]

## 2016-02-14 ENCOUNTER — Ambulatory Visit: Payer: Medicare Other | Admitting: Family

## 2016-02-14 LAB — MRSA PCR SCREENING: MRSA BY PCR: NEGATIVE

## 2016-02-14 MED ORDER — HALOPERIDOL LACTATE 5 MG/ML IJ SOLN
0.5000 mg | Freq: Four times a day (QID) | INTRAMUSCULAR | Status: DC | PRN
Start: 2016-02-14 — End: 2016-02-14
  Administered 2016-02-14: 0.5 mg via INTRAVENOUS
  Filled 2016-02-14: qty 1

## 2016-02-14 MED ORDER — LORAZEPAM 2 MG/ML IJ SOLN
1.0000 mg | Freq: Once | INTRAMUSCULAR | Status: AC
  Administered 2016-02-14: 06:00:00 1 mg via INTRAVENOUS

## 2016-02-14 MED ORDER — LORAZEPAM 1 MG PO TABS: 1.0000 mg | ORAL_TABLET | ORAL | Status: DC | PRN

## 2016-02-14 MED ORDER — LORAZEPAM 2 MG/ML IJ SOLN
2.0000 mg | Freq: Once | INTRAMUSCULAR | Status: AC
  Administered 2016-02-14: 12:00:00 2 mg via INTRAMUSCULAR
  Filled 2016-02-14: qty 1

## 2016-02-14 MED ORDER — HALOPERIDOL LACTATE 5 MG/ML IJ SOLN
1.0000 mg | INTRAMUSCULAR | Status: DC | PRN
  Administered 2016-02-14: 1 mg via INTRAVENOUS
  Filled 2016-02-14: qty 1

## 2016-02-14 MED ORDER — LORAZEPAM 1 MG PO TABS: 1.0000 mg | ORAL_TABLET | ORAL | 0 refills | Status: AC | PRN

## 2016-02-14 MED ORDER — LORAZEPAM 2 MG/ML IJ SOLN
2.0000 mg | INTRAMUSCULAR | Status: DC | PRN
  Administered 2016-02-14 (×2): 2 mg via INTRAVENOUS
  Filled 2016-02-14 (×2): qty 1

## 2016-02-14 MED ORDER — HALOPERIDOL LACTATE 5 MG/ML IJ SOLN
1.0000 mg | INTRAMUSCULAR | Status: AC
  Administered 2016-02-14: 10:00:00 1 mg via INTRAVENOUS
  Filled 2016-02-14: qty 1

## 2016-02-14 MED ORDER — HALOPERIDOL LACTATE 5 MG/ML IJ SOLN: 1.0000 mg | Freq: Once | INTRAMUSCULAR | Status: DC

## 2016-02-14 MED ORDER — MORPHINE SULFATE (CONCENTRATE) 10 MG/0.5ML PO SOLN: 5.0000 mg | ORAL | 0 refills | Status: AC | PRN

## 2016-02-14 MED ORDER — LORAZEPAM 2 MG/ML IJ SOLN: 2.0000 mg | Freq: Once | INTRAMUSCULAR | Status: DC

## 2016-02-14 NOTE — Progress Notes (Signed)
After multiple attempts to ease patients agitation, patient is now resting comfortably. No agitation noted at this time. Son remains at bedside. Madlyn Frankel, RN

## 2016-02-14 NOTE — Progress Notes (Signed)
CSW was informed by [facility] that they are able to accept patient today. Requested CSW have the Attending MD (MD Manuella Ghazi) to sign a CTI form and the family sign consent forms. CSW obtained signatures and faxed the forms to [phone]. CSW also faxed patient's Discharge summary. CSW contacted [facility] to transport patient. Patient will discharge to [facility] today 02/14/16 via EMS. Family is in agreement.  Ernest Pine, MSW, LCSW, [place] Clinical Social Worker [phone]

## 2016-02-14 NOTE — Discharge Summary (Signed)
[facility] NAME: Jill York    MR#:  [id number]  DATE OF BIRTH:  08-15-1930  DATE OF ADMISSION:  02/12/2016   ADMITTING PHYSICIAN: Lytle Butte, MD  DATE OF DISCHARGE:02/14/2016  PRIMARY CARE PHYSICIAN: Sharyne Peach, MD   ADMISSION DIAGNOSIS:  Multisystem organ failure [R69] Altered mental status, unspecified altered mental status type [R41.82] DISCHARGE DIAGNOSIS:  Active Problems:   Acute on chronic respiratory failure with hypoxia (HCC)  SECONDARY DIAGNOSIS:   Past Medical History:  Diagnosis Date  . Anemia   . Anxiety and depression    [name]  . Arrhythmia    PVC  . CHF (congestive heart failure) ([place])   . Chronic kidney disease   . COPD (chronic obstructive pulmonary disease) ([place])   . Diabetes mellitus without complication ([place])   . Hyperlipidemia   . Hypertension   . Lymphedema   . Lymphoma ([place])    of breast- > 10 years  . Lymphoma ([place])    in Duod[place]- diagnosed July 2017  . OSA (obstructive sleep apnea) 2011  . Osteoarthritis   . Thyroid disease    HOSPITAL COURSE:  82 year old Caucasian female history of COPD and systolic congestive heart failure admitted for shortness of breath and altered mental status  1. Acute on chronic hypoxic respiratory failure with elevated troponin and acute renal fialure Pt on comfort measures only per son's request -morphine prn  * Agitation - prn ativan  DISCHARGE CONDITIONS:  fair CONSULTS OBTAINED:  Treatment Team:  Lytle Butte, MD DRUG ALLERGIES:   Allergies  Allergen Reactions  . Amlodipine Swelling    Leg swelling only   . Oxycodone Other (See Comments)    Pt states she was"out " for 2 hours and didnt notice people in the area   . Demerol [Meperidine] Diarrhea and Nausea And Vomiting  . Lisinopril Cough  . Tape Rash   DISCHARGE MEDICATIONS:     Medication List    STOP taking these medications     acetaminophen 500 MG tablet Commonly known as:  TYLENOL   albuterol (2.5 MG/3ML) 0.083% nebulizer solution Commonly known as:  PROVENTIL   albuterol 108 (90 Base) MCG/ACT inhaler Commonly known as:  PROVENTIL HFA;VENTOLIN HFA   atorvastatin 80 MG tablet Commonly known as:  LIPITOR   baclofen 10 MG tablet Commonly known as:  LIORESAL   bisacodyl 5 MG EC tablet Commonly known as:  DULCOLAX   calcitRIOL 0.25 MCG capsule Commonly known as:  ROCALTROL   Cholecalciferol 1000 units tablet   docusate sodium 100 MG capsule Commonly known as:  COLACE   esomeprazole 20 MG capsule Commonly known as:  NEXIUM   feeding supplement (GLUCERNA SHAKE) Liqd   fluticasone 50 MCG/ACT nasal spray Commonly known as:  FLONASE   Fluticasone-Salmeterol 250-50 MCG/DOSE Aepb Commonly known as:  ADVAIR   furosemide 40 MG tablet Commonly known as:  LASIX   gabapentin 300 MG capsule Commonly known as:  NEURONTIN   insulin glargine 100 UNIT/ML injection Commonly known as:  LANTUS   insulin lispro 100 UNIT/ML injection Commonly known as:  HUMALOG   latanoprost 0.005 % ophthalmic solution Commonly known as:  XALATAN   metoprolol succinate 25 MG 24 hr tablet Commonly known as:  TOPROL-XL   montelukast 10 MG tablet Commonly known as:  SINGULAIR   nitroGLYCERIN 0.4 MG SL tablet Commonly known as:  NITROSTAT   nystatin 100000 UNIT/ML  suspension Commonly known as:  MYCOSTATIN   ondansetron 4 MG tablet Commonly known as:  ZOFRAN   pantoprazole 40 MG tablet Commonly known as:  PROTONIX   polyethylene glycol packet Commonly known as:  MIRALAX / GLYCOLAX   potassium chloride SA 20 MEQ tablet Commonly known as:  K-DUR,KLOR-CON   senna-docusate 8.6-50 MG tablet Commonly known as:  Senokot-S   tetrahydrozoline 0.05 % ophthalmic solution   venlafaxine 75 MG tablet Commonly known as:  EFFEXOR     TAKE these medications   levothyroxine 137 MCG tablet Commonly known as:   SYNTHROID, LEVOTHROID Take 137-274 mcg by mouth See admin instructions. Take 181mcg (1 tablet) by mouth daily on Mon, Tues, Wed, Thurs, Sat, and Sun. Take 254mcg (2 tabs) by mouth daily on Friday. (Take with a glass of water at least 30 to 60 minutes before breakfast)   LORazepam 1 MG tablet Commonly known as:  ATIVAN Take 1 tablet (1 mg total) by mouth every 4 (four) hours as needed for anxiety.   LORazepam 1 MG tablet Commonly known as:  ATIVAN Place 1 tablet (1 mg total) under the tongue every 4 (four) hours as needed for anxiety.   morphine CONCENTRATE 10 MG/0.5ML Soln concentrated solution Take 0.25 mLs (5 mg total) by mouth every 2 (two) hours as needed for moderate pain (or dyspnea).      DISCHARGE INSTRUCTIONS:   DIET:  Regular diet DISCHARGE CONDITION:  Fair ACTIVITY:  Activity as tolerated OXYGEN:  Home Oxygen: No.  Oxygen Delivery: room air DISCHARGE LOCATION:  Hospice Home   If you experience worsening of your admission symptoms, develop shortness of breath, life threatening emergency, suicidal or homicidal thoughts you must seek medical attention immediately by calling 911 or calling your MD immediately  if symptoms less severe.  You Must read complete instructions/literature along with all the possible adverse reactions/side effects for all the Medicines you take and that have been prescribed to you. Take any new Medicines after you have completely understood and accpet all the possible adverse reactions/side effects.   Please note  You were cared for by a hospitalist during your hospital stay. If you have any questions about your discharge medications or the care you received while you were in the hospital after you are discharged, you can call the unit and asked to speak with the hospitalist on call if the hospitalist that took care of you is not available. Once you are discharged, your primary care physician will handle any further medical issues. Please note that  NO REFILLS for any discharge medications will be authorized once you are discharged, as it is imperative that you return to your primary care physician (or establish a relationship with a primary care physician if you do not have one) for your aftercare needs so that they can reassess your need for medications and monitor your lab values.    On the day of Discharge:  VITAL SIGNS:  Blood pressure (!) 118/47, pulse 73, temperature 97.7 F (36.5 C), temperature source Oral, resp. rate 20, height 5\' 4"  (1.626 m), weight 111.6 kg (246 lb), SpO2 91 %. PHYSICAL EXAMINATION:  GENERAL:  82 y.o.-year-old patient lying in the bed with no acute distress.  EYES: Pupils equal, round, reactive to light and accommodation. No scleral icterus. Marland Kitchen  HEENT: Head atraumatic, normocephalic. Oropharynx and nasopharynx clear.  NECK:  Supple, no jugular venous distention. No thyroid enlargement, no tenderness.  LUNGS: Normal breath sounds bilaterally, no wheezing, rales, rhonchi. No use of  accessory muscles of respiration.  CARDIOVASCULAR: S1, S2 normal. No murmurs, rubs, or gallops.  ABDOMEN: Soft, nontender, nondistended. Bowel sounds present. No organomegaly or mass.  EXTREMITIES: No cyanosis, clubbing or edema b/l.    NEUROLOGIC:unresponsive  PSYCHIATRIC:  Unresponsive DATA REVIEW:   CBC  Recent Labs Lab 02/12/16 2014  WBC 7.7  HGB 8.8*  HCT 27.5*  PLT 208    Chemistries   Recent Labs Lab 02/12/16 1901  NA 140  K 4.6  CL 92*  CO2 38*  GLUCOSE 76  BUN 73*  CREATININE 3.61*  CALCIUM 8.6*  AST 543*  ALT 686*  ALKPHOS 144*  BILITOT 1.5*     Management plans discussed with the patient, family and they are in agreement.  CODE STATUS: DNR, comfort care  TOTAL TIME TAKING CARE OF THIS PATIENT: 45 minutes.    [facility], Bern Fare M.D on 02/14/2016 at 2:23 PM  Between 7am to 6pm - Pager - [phone]  After 6pm go to [email or web address] - Proofreader  [facility]    Office  [phone]  CC: Primary care physician; Sharyne Peach, MD   Note: This dictation was prepared with Dragon dictation along with smaller phrase technology. Any transcriptional errors that result from this process are unintentional.

## 2016-02-14 NOTE — Discharge Instructions (Signed)
Hospice °Hospice is a service that is designed to provide people who are terminally ill and their families with medical, spiritual, and psychological support. Its aim is to improve your quality of life by keeping you as alert and comfortable as possible. Hospice is performed by a team of health care professionals and volunteers who: °· Help keep you comfortable. Hospice can be provided in your home or in a homelike setting. The hospice staff works with your family and friends to help meet your needs. You will enjoy the support of loved ones by receiving much of your basic care from family and friends. °· Provide pain relief and manage your symptoms. The staff supply all necessary medicines and equipment. °· Provide companionship when you are alone. °· Allow you and your family to rest. They may do light housekeeping, prepare meals, and run errands. °· Provide counseling. They will make sure your emotional, spiritual, and social needs and those of your family are being met. °· Provide spiritual care. Spiritual care is individualized to meet your needs and your family's needs. It may involve helping you look at what death means to you, say goodbye, or perform a specific religious ceremony or ritual. °Hospice teams often include: °· A nurse. °· A doctor. °· Social workers. °· Religious leaders (such as a chaplain). °· Trained volunteers. °WHEN SHOULD HOSPICE CARE BEGIN? °Most people who use hospice are believed to have fewer than 6 months to live. Your family and health care providers can help you decide when hospice services should begin. If your condition improves, you may discontinue the program. °WHAT SHOULD I CONSIDER BEFORE SELECTING A PROGRAM? °Most hospice programs are run by nonprofit, independent organizations. Some are affiliated with hospitals, nursing homes, or home health care agencies. Hospice programs can take place in the home or at a hospice center, hospital, or skilled nursing facility. When choosing  a hospice program, ask the following questions: °· What services are available to me? °· What services are offered to my loved ones? °· How involved are my loved ones? °· How involved is my health care provider? °· Who makes up the hospice care team? How are they trained or screened? °· How will my pain and symptoms be managed? °· If my circumstances change, can the services be provided in a different setting, such as my home or in the hospital? °· Is the program reviewed and licensed by the state or certified in some other way? °WHERE CAN I LEARN MORE ABOUT HOSPICE? °You can learn about existing hospice programs in your area from your health care providers. You can also read more about hospice online. The websites of the following organizations contain helpful information: °· The National Hospice and Palliative Care Organization (NHPCO). °· The Hospice Association of America (HAA). °· The Hospice Education Institute. °· The American Cancer Society (ACS). °· Hospice Net. °  °This information is not intended to replace advice given to you by your health care provider. Make sure you discuss any questions you have with your health care provider. °  °Document Released: 09/27/2003 Document Revised: 06/15/2013 Document Reviewed: 04/20/2013 °Elsevier Interactive Patient Education ©2016 Elsevier Inc. ° °

## 2016-02-14 NOTE — Progress Notes (Signed)
Patient discharged to [facility] via EMS. Madlyn Frankel, RN

## 2016-02-14 NOTE — Progress Notes (Signed)
CSW contacted [facility] to follow up on referral made by CSW yesterday 02/13/16. [place] requested CSW fax clinical information (H&P, Demographic information, MD progress note and Medication List) to [phone]. CSW faxed the above requested information. Provided CSW with Jill York's number to follow up on the referral [phone] or [phone]. Awaiting for [facility] to respond. CSW will continue to follow and assist.  Ernest Pine, MSW, LCSW, [place] Social Worker [phone]

## 2016-02-14 NOTE — Progress Notes (Signed)
CSW contacted [facility]. She reported they received CSW's fax. Stated that the MD is reviewing patient to see if they will accept her. Once MD has made his decisions CSW will receive a phone call back once they have accepted patient. CSW will continue to follow and assist.  Ernest Pine, MSW, LCSW, [place] Social Worker [phone]

## 2016-02-14 NOTE — Progress Notes (Signed)
CSW received a phone call form [facility]. Requested CSW to fax information to [phone]. Stated she'd review the information. CSW will continue to follow and assist.  Ernest Pine, MSW, LCSW, [place] Social Worker [phone]

## 2016-02-17 LAB — CULTURE, BLOOD (ROUTINE X 2): CULTURE: NO GROWTH

## 2016-02-23 DEATH — deceased

## 2017-10-17 IMAGING — DX DG KNEE 1-2V*R*
2 series · 2 of 2 positions shown · non-contrast
Comparison: None.

CLINICAL DATA: Fall.  Pain.

EXAM:
RIGHT KNEE - 1-2 VIEW

[knee ap]
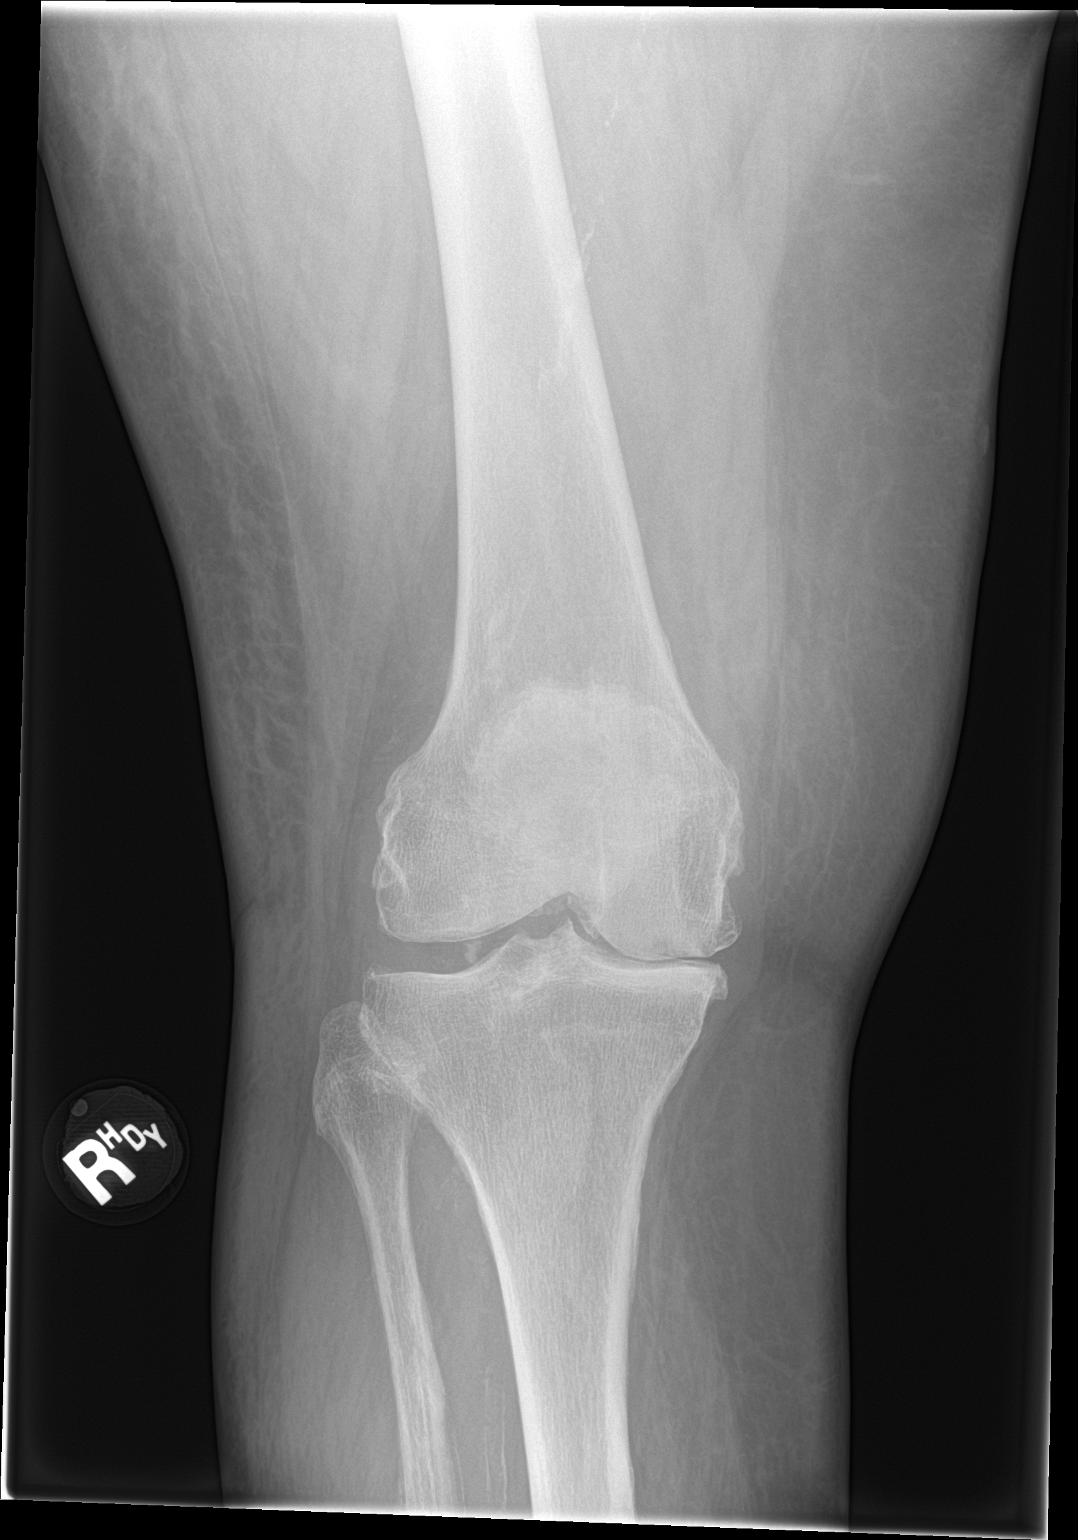

[knee lat]
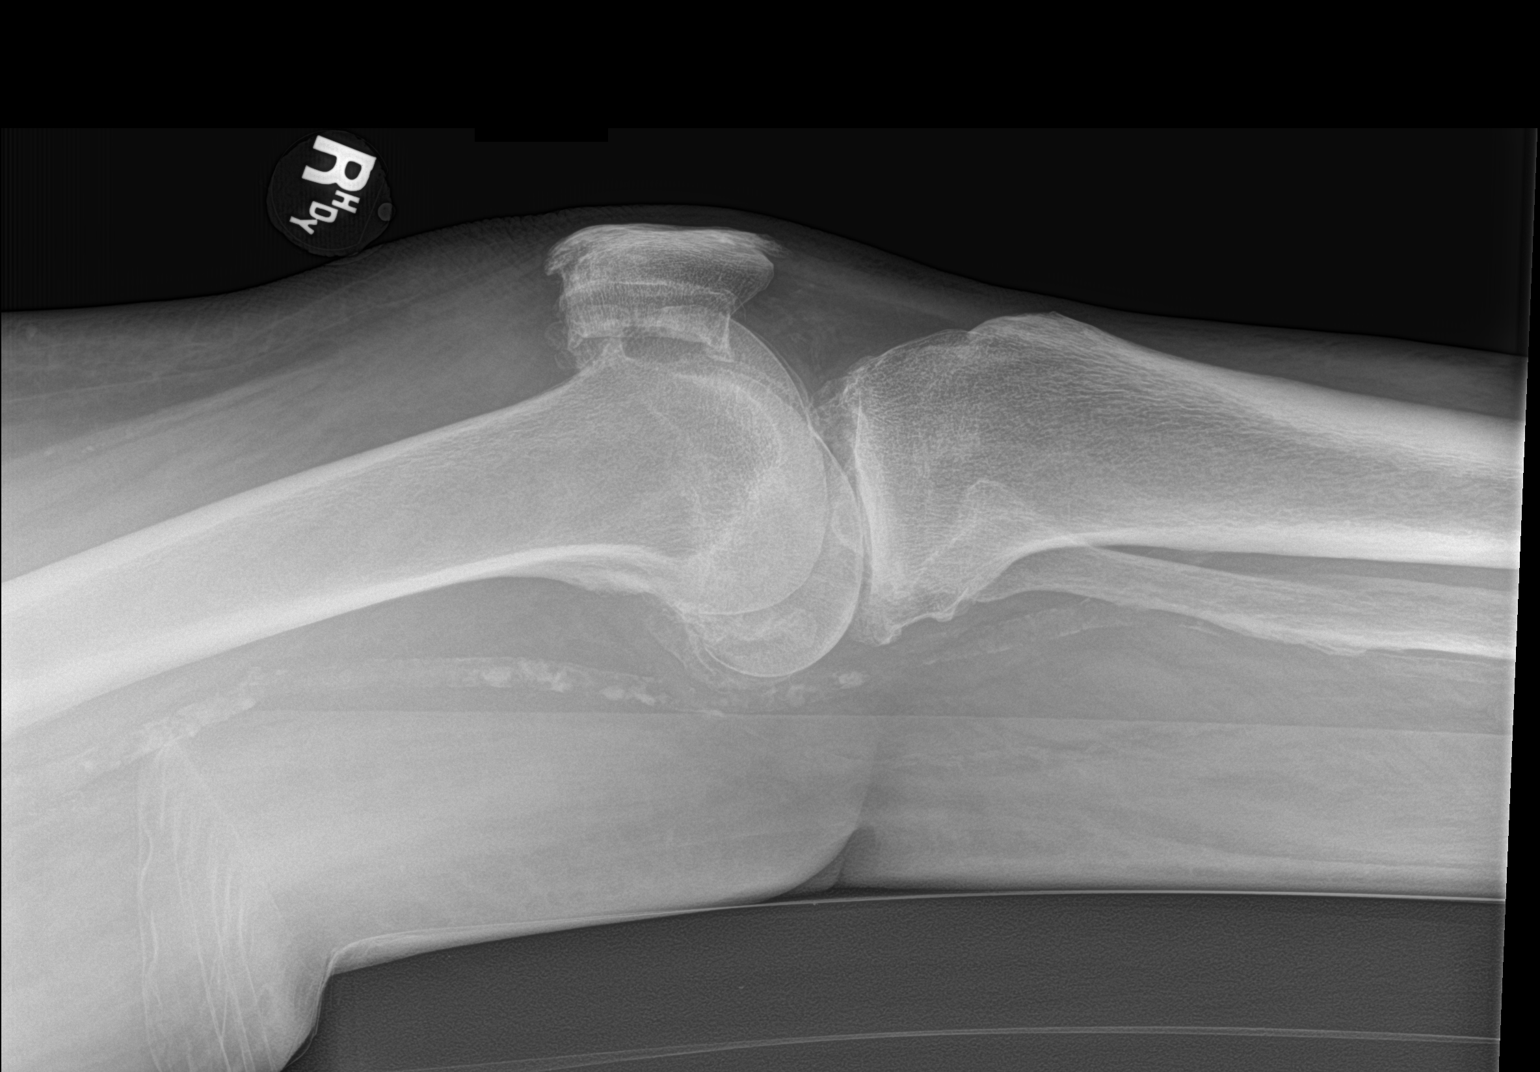

[2 of 2 positions shown; findings below may reference images not displayed]

FINDINGS: Tricompartment degenerative change. No evidence fracture
dislocation. Loose bodies noted. Peripheral vascular calcification .
IMPRESSION: 1. Severe tricompartment degenerative change. Degenerative changes
most prominent about the medial and patellofemoral compartments.
Loose bodies noted.

2. Peripheral vascular disease .

## 2017-10-18 IMAGING — CR DG SHOULDER 2+V*R*
3 series · 3 of 3 positions shown · non-contrast
Comparison: 10/20/2014

CLINICAL DATA: Fall, right shoulder pain

EXAM:
RIGHT SHOULDER - 2+ VIEW

[shoulder grashey]
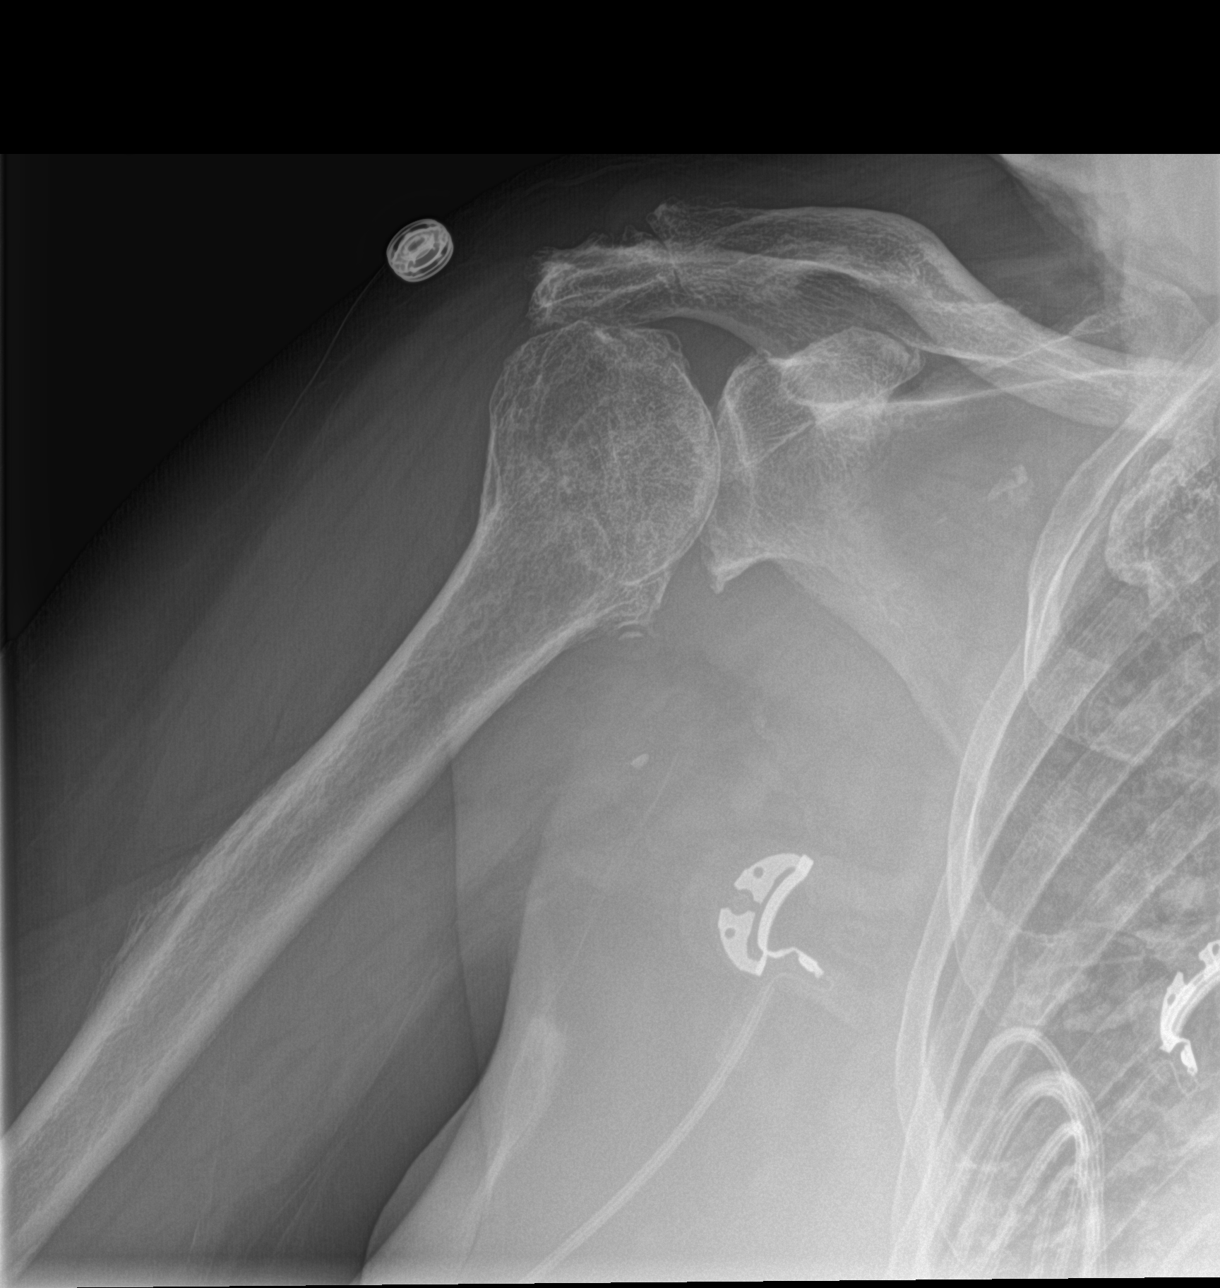

[shoulder y view]
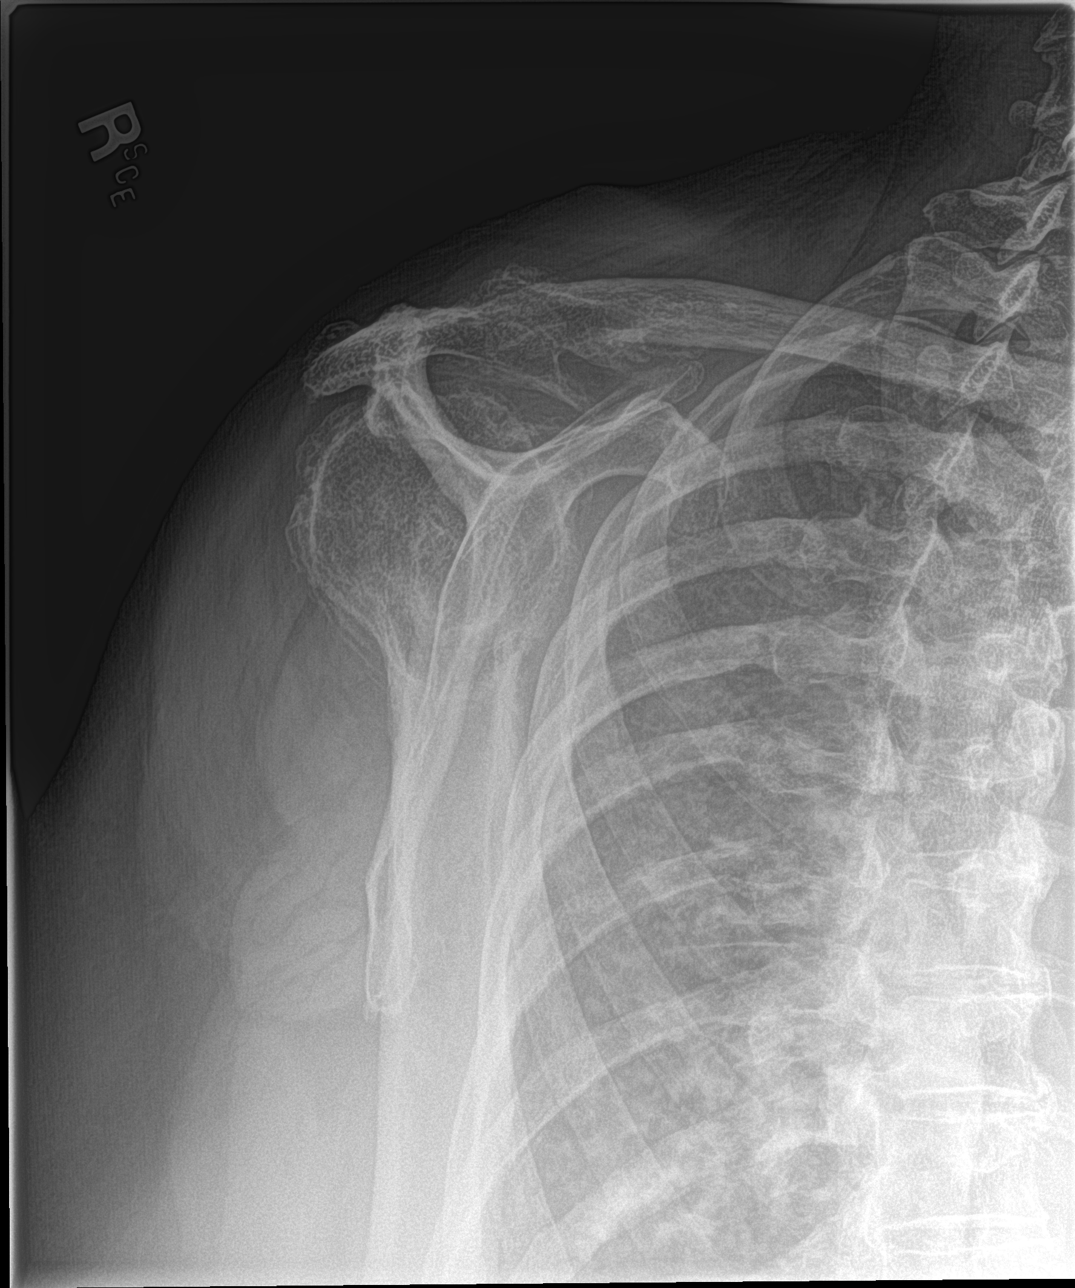

[shoulder axillary]
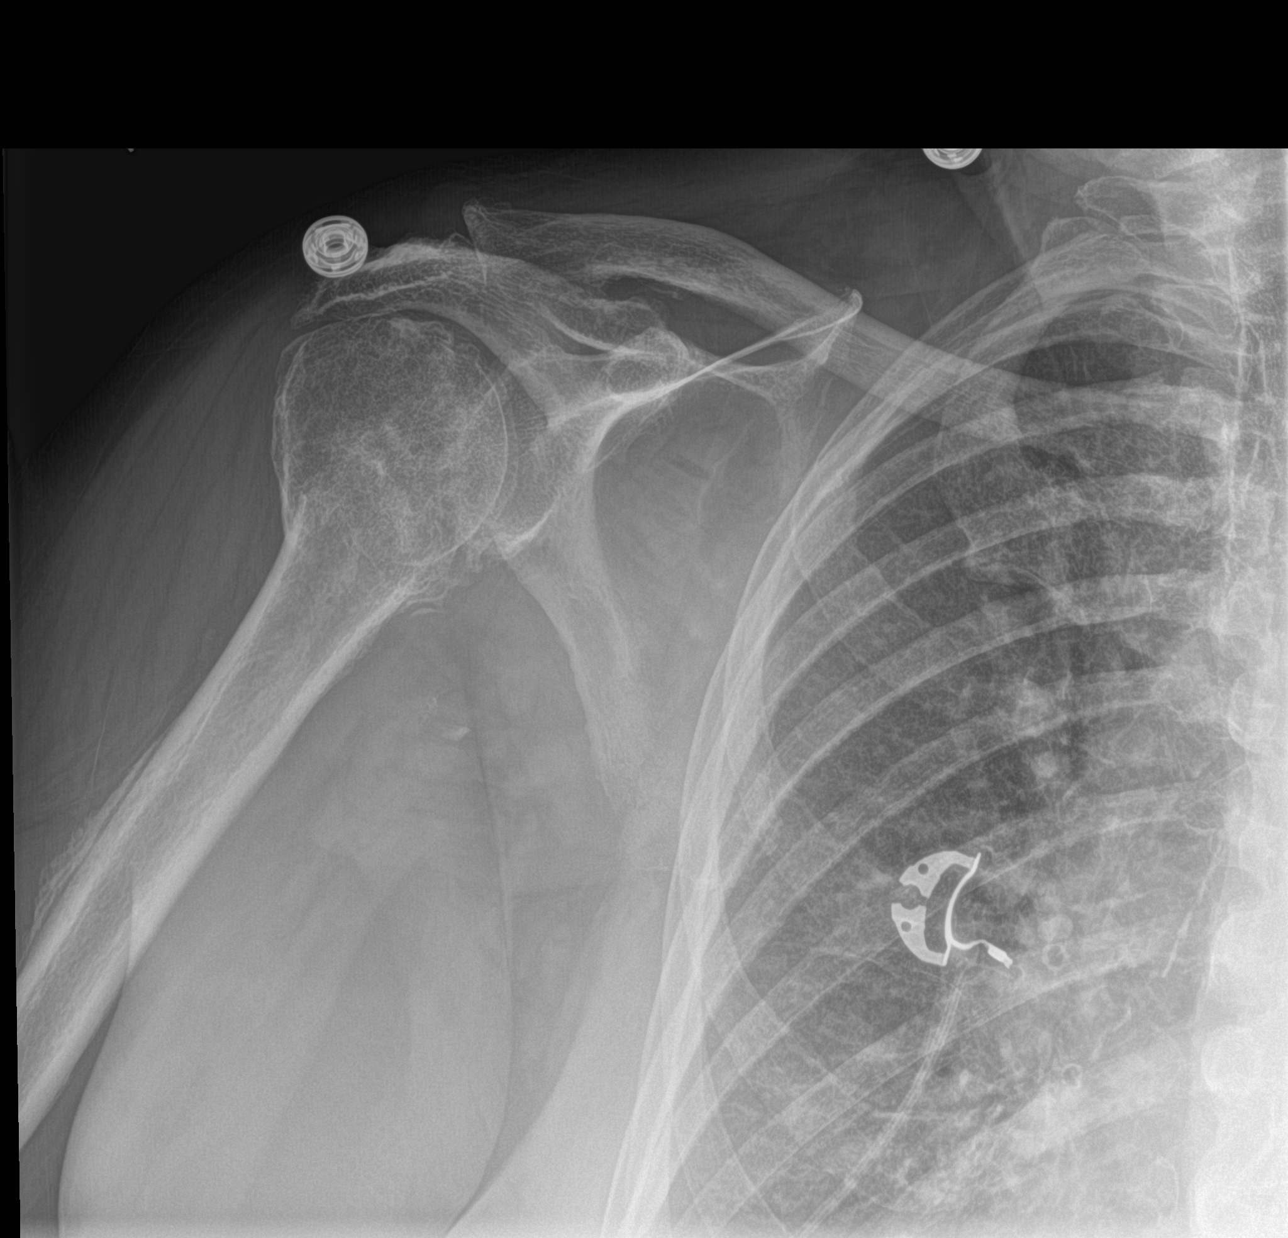

[3 of 3 positions shown; findings below may reference images not displayed]

FINDINGS: Three views of the right shoulder submitted. There is old fracture
deformity of the right humeral head without significant change from
prior exam. Extensive degenerative changes AC joint again noted.
Stable degenerative changes glenohumeral joint. Stable mild inferior
spurring of glenoid. No acute fracture or subluxation.
IMPRESSION: No acute fracture or or subluxation. Stable old fracture deformity
of the right humeral head. Extensive osteoarthritic changes as
described above. Diffuse osteopenia.
